# Patient Record
Sex: Male | Born: 1945 | Race: White | Hispanic: No | Marital: Married | Smoking: Current some day smoker
Health system: Southern US, Community
[De-identification: ages and names within clinical notes are randomized; demographics above are authoritative.]

## PROBLEM LIST (undated history)

## (undated) DIAGNOSIS — K429 Umbilical hernia without obstruction or gangrene: Secondary | ICD-10-CM

## (undated) DIAGNOSIS — Z95810 Presence of automatic (implantable) cardiac defibrillator: Secondary | ICD-10-CM

## (undated) DIAGNOSIS — M5030 Other cervical disc degeneration, unspecified cervical region: Secondary | ICD-10-CM

## (undated) DIAGNOSIS — E559 Vitamin D deficiency, unspecified: Secondary | ICD-10-CM

## (undated) DIAGNOSIS — Z950 Presence of cardiac pacemaker: Secondary | ICD-10-CM

## (undated) DIAGNOSIS — K279 Peptic ulcer, site unspecified, unspecified as acute or chronic, without hemorrhage or perforation: Secondary | ICD-10-CM

## (undated) DIAGNOSIS — M545 Other chronic pain: Secondary | ICD-10-CM

## (undated) DIAGNOSIS — I2510 Atherosclerotic heart disease of native coronary artery without angina pectoris: Secondary | ICD-10-CM

## (undated) DIAGNOSIS — I714 Abdominal aortic aneurysm, without rupture, unspecified: Secondary | ICD-10-CM

## (undated) DIAGNOSIS — N183 Chronic kidney disease, stage 3 unspecified: Secondary | ICD-10-CM

## (undated) DIAGNOSIS — J189 Pneumonia, unspecified organism: Secondary | ICD-10-CM

## (undated) DIAGNOSIS — H509 Unspecified strabismus: Secondary | ICD-10-CM

## (undated) DIAGNOSIS — R0789 Other chest pain: Secondary | ICD-10-CM

## (undated) DIAGNOSIS — F32A Depression, unspecified: Secondary | ICD-10-CM

## (undated) DIAGNOSIS — M2550 Pain in unspecified joint: Secondary | ICD-10-CM

## (undated) DIAGNOSIS — I420 Dilated cardiomyopathy: Secondary | ICD-10-CM

## (undated) DIAGNOSIS — F329 Major depressive disorder, single episode, unspecified: Secondary | ICD-10-CM

## (undated) DIAGNOSIS — S92009A Unspecified fracture of unspecified calcaneus, initial encounter for closed fracture: Secondary | ICD-10-CM

## (undated) DIAGNOSIS — M7918 Myalgia, other site: Secondary | ICD-10-CM

## (undated) DIAGNOSIS — G8929 Other chronic pain: Secondary | ICD-10-CM

## (undated) DIAGNOSIS — I340 Nonrheumatic mitral (valve) insufficiency: Secondary | ICD-10-CM

## (undated) DIAGNOSIS — J449 Chronic obstructive pulmonary disease, unspecified: Secondary | ICD-10-CM

## (undated) DIAGNOSIS — F17200 Nicotine dependence, unspecified, uncomplicated: Secondary | ICD-10-CM

## (undated) DIAGNOSIS — K922 Gastrointestinal hemorrhage, unspecified: Secondary | ICD-10-CM

## (undated) DIAGNOSIS — K219 Gastro-esophageal reflux disease without esophagitis: Secondary | ICD-10-CM

## (undated) DIAGNOSIS — I219 Acute myocardial infarction, unspecified: Secondary | ICD-10-CM

## (undated) DIAGNOSIS — Z8679 Personal history of other diseases of the circulatory system: Secondary | ICD-10-CM

## (undated) DIAGNOSIS — T4145XA Adverse effect of unspecified anesthetic, initial encounter: Secondary | ICD-10-CM

## (undated) DIAGNOSIS — F419 Anxiety disorder, unspecified: Secondary | ICD-10-CM

## (undated) DIAGNOSIS — I447 Left bundle-branch block, unspecified: Secondary | ICD-10-CM

## (undated) DIAGNOSIS — I739 Peripheral vascular disease, unspecified: Secondary | ICD-10-CM

## (undated) DIAGNOSIS — F4310 Post-traumatic stress disorder, unspecified: Secondary | ICD-10-CM

## (undated) DIAGNOSIS — R0602 Shortness of breath: Secondary | ICD-10-CM

## (undated) HISTORY — DX: Peptic ulcer, site unspecified, unspecified as acute or chronic, without hemorrhage or perforation: K27.9

## (undated) HISTORY — DX: Personal history of other diseases of the circulatory system: Z86.79

## (undated) HISTORY — DX: Left bundle-branch block, unspecified: I44.7

## (undated) HISTORY — PX: CARDIOVASCULAR STRESS TEST: SHX262

## (undated) HISTORY — DX: Chronic kidney disease, stage 3 (moderate): N18.3

## (undated) HISTORY — DX: Peripheral vascular disease, unspecified: I73.9

## (undated) HISTORY — DX: Chronic kidney disease, stage 3 unspecified: N18.30

## (undated) HISTORY — DX: Chronic obstructive pulmonary disease, unspecified: J44.9

## (undated) HISTORY — DX: Unspecified strabismus: H50.9

## (undated) HISTORY — DX: Depression, unspecified: F32.A

## (undated) HISTORY — DX: Anxiety disorder, unspecified: F41.9

## (undated) HISTORY — DX: Other cervical disc degeneration, unspecified cervical region: M50.30

## (undated) HISTORY — DX: Low back pain: M54.5

## (undated) HISTORY — DX: Abdominal aortic aneurysm, without rupture, unspecified: I71.40

## (undated) HISTORY — DX: Unspecified fracture of unspecified calcaneus, initial encounter for closed fracture: S92.009A

## (undated) HISTORY — DX: Other chronic pain: G89.29

## (undated) HISTORY — PX: OTHER SURGICAL HISTORY: SHX169

## (undated) HISTORY — DX: Atherosclerotic heart disease of native coronary artery without angina pectoris: I25.10

## (undated) HISTORY — DX: Other chest pain: R07.89

## (undated) HISTORY — PX: CARDIAC CATHETERIZATION: SHX172

## (undated) HISTORY — DX: Nonrheumatic mitral (valve) insufficiency: I34.0

## (undated) HISTORY — DX: Pain in unspecified joint: M25.50

## (undated) HISTORY — DX: Myalgia, other site: M79.18

## (undated) HISTORY — DX: Major depressive disorder, single episode, unspecified: F32.9

## (undated) HISTORY — DX: Abdominal aortic aneurysm, without rupture: I71.4

## (undated) HISTORY — DX: Nicotine dependence, unspecified, uncomplicated: F17.200

## (undated) HISTORY — DX: Dilated cardiomyopathy: I42.0

## (undated) HISTORY — DX: Other chronic pain: M54.50

## (undated) HISTORY — DX: Gastro-esophageal reflux disease without esophagitis: K21.9

## (undated) HISTORY — DX: Post-traumatic stress disorder, unspecified: F43.10

## (undated) HISTORY — DX: Vitamin D deficiency, unspecified: E55.9

---
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
## 1991-03-31 HISTORY — PX: FRACTURE SURGERY: SHX138

## 1999-03-16 ENCOUNTER — Encounter: Payer: Self-pay | Admitting: Physical Therapy

## 1999-03-16 ENCOUNTER — Ambulatory Visit (HOSPITAL_COMMUNITY): Admission: RE | Admit: 1999-03-16 | Discharge: 1999-03-16 | Payer: Self-pay | Admitting: Physical Therapy

## 2005-01-05 ENCOUNTER — Ambulatory Visit: Payer: Self-pay | Admitting: Family Medicine

## 2006-04-13 ENCOUNTER — Encounter: Payer: Self-pay | Admitting: Family Medicine

## 2007-06-06 ENCOUNTER — Encounter: Payer: Self-pay | Admitting: Family Medicine

## 2010-03-30 DIAGNOSIS — I420 Dilated cardiomyopathy: Secondary | ICD-10-CM

## 2010-03-30 DIAGNOSIS — T8859XA Other complications of anesthesia, initial encounter: Secondary | ICD-10-CM

## 2010-03-30 HISTORY — DX: Other complications of anesthesia, initial encounter: T88.59XA

## 2010-03-30 HISTORY — DX: Dilated cardiomyopathy: I42.0

## 2010-04-16 ENCOUNTER — Encounter: Payer: Self-pay | Admitting: Cardiology

## 2010-04-16 ENCOUNTER — Ambulatory Visit
Admission: RE | Admit: 2010-04-16 | Discharge: 2010-04-16 | Payer: Self-pay | Source: Home / Self Care | Attending: Family Medicine | Admitting: Family Medicine

## 2010-04-16 ENCOUNTER — Encounter: Payer: Self-pay | Admitting: Family Medicine

## 2010-04-16 DIAGNOSIS — J449 Chronic obstructive pulmonary disease, unspecified: Secondary | ICD-10-CM | POA: Insufficient documentation

## 2010-04-16 DIAGNOSIS — M545 Low back pain: Secondary | ICD-10-CM | POA: Insufficient documentation

## 2010-04-16 DIAGNOSIS — I2510 Atherosclerotic heart disease of native coronary artery without angina pectoris: Secondary | ICD-10-CM | POA: Insufficient documentation

## 2010-04-16 DIAGNOSIS — M67919 Unspecified disorder of synovium and tendon, unspecified shoulder: Secondary | ICD-10-CM | POA: Insufficient documentation

## 2010-04-16 DIAGNOSIS — J4489 Other specified chronic obstructive pulmonary disease: Secondary | ICD-10-CM | POA: Insufficient documentation

## 2010-04-16 DIAGNOSIS — F331 Major depressive disorder, recurrent, moderate: Secondary | ICD-10-CM | POA: Insufficient documentation

## 2010-04-16 DIAGNOSIS — M719 Bursopathy, unspecified: Secondary | ICD-10-CM

## 2010-04-16 DIAGNOSIS — I447 Left bundle-branch block, unspecified: Secondary | ICD-10-CM | POA: Insufficient documentation

## 2010-04-16 DIAGNOSIS — F17200 Nicotine dependence, unspecified, uncomplicated: Secondary | ICD-10-CM | POA: Insufficient documentation

## 2010-04-17 ENCOUNTER — Telehealth: Payer: Self-pay | Admitting: Family Medicine

## 2010-04-18 ENCOUNTER — Ambulatory Visit
Admission: RE | Admit: 2010-04-18 | Discharge: 2010-04-18 | Payer: Self-pay | Source: Home / Self Care | Attending: Cardiology | Admitting: Cardiology

## 2010-04-18 DIAGNOSIS — R0602 Shortness of breath: Secondary | ICD-10-CM | POA: Insufficient documentation

## 2010-04-18 DIAGNOSIS — R011 Cardiac murmur, unspecified: Secondary | ICD-10-CM | POA: Insufficient documentation

## 2010-04-21 ENCOUNTER — Encounter: Payer: Self-pay | Admitting: Family Medicine

## 2010-04-21 LAB — CONVERTED CEMR LAB
ALT: 12 units/L (ref 0–53)
AST: 14 units/L (ref 0–37)
Albumin: 4.7 g/dL (ref 3.5–5.2)
Alkaline Phosphatase: 107 units/L (ref 39–117)
BUN: 13 mg/dL (ref 6–23)
Basophils Absolute: 0.1 10*3/uL (ref 0.0–0.1)
Basophils Relative: 1 % (ref 0–1)
Bilirubin, Direct: 0.1 mg/dL (ref 0.0–0.3)
CO2: 26 meq/L (ref 19–32)
Calcium: 9.9 mg/dL (ref 8.4–10.5)
Chloride: 99 meq/L (ref 96–112)
Cholesterol: 231 mg/dL — ABNORMAL HIGH (ref 0–200)
Creatinine, Ser: 1.18 mg/dL (ref 0.40–1.50)
Eosinophils Absolute: 0.3 10*3/uL (ref 0.0–0.7)
Eosinophils Relative: 3 % (ref 0–5)
Glucose, Bld: 86 mg/dL (ref 70–99)
HCT: 47.8 % (ref 39.0–52.0)
HDL: 46 mg/dL (ref >39)
Hemoglobin: 16.2 g/dL (ref 13.0–17.0)
Indirect Bilirubin: 0.6 mg/dL (ref 0.0–0.9)
LDL Cholesterol: 159 mg/dL — ABNORMAL HIGH (ref 0–99)
Lymphocytes Relative: 29 % (ref 12–46)
Lymphs Abs: 2.9 10*3/uL (ref 0.7–4.0)
MCHC: 33.9 g/dL (ref 30.0–36.0)
MCV: 93.5 fL (ref 78.0–100.0)
Monocytes Absolute: 1 10*3/uL (ref 0.1–1.0)
Monocytes Relative: 10 % (ref 3–12)
Neutro Abs: 5.8 10*3/uL (ref 1.7–7.7)
Neutrophils Relative %: 58 % (ref 43–77)
Platelets: 276 10*3/uL (ref 150–400)
Potassium: 4.6 meq/L (ref 3.5–5.3)
RBC: 5.11 M/uL (ref 4.22–5.81)
RDW: 14 % (ref 11.5–15.5)
Sodium: 140 meq/L (ref 135–145)
Total Bilirubin: 0.7 mg/dL (ref 0.3–1.2)
Total CHOL/HDL Ratio: 5
Total Protein: 7.7 g/dL (ref 6.0–8.3)
Triglycerides: 132 mg/dL (ref <150)
VLDL: 26 mg/dL (ref 0–40)
WBC: 10 10*3/uL (ref 4.0–10.5)

## 2010-04-22 ENCOUNTER — Telehealth: Payer: Self-pay | Admitting: Family Medicine

## 2010-04-23 ENCOUNTER — Encounter: Payer: Self-pay | Admitting: Family Medicine

## 2010-04-24 ENCOUNTER — Encounter: Payer: Self-pay | Admitting: Family Medicine

## 2010-04-24 ENCOUNTER — Telehealth (INDEPENDENT_AMBULATORY_CARE_PROVIDER_SITE_OTHER): Payer: Self-pay | Admitting: *Deleted

## 2010-04-28 ENCOUNTER — Encounter: Payer: Self-pay | Admitting: Cardiology

## 2010-04-28 ENCOUNTER — Ambulatory Visit (HOSPITAL_COMMUNITY)
Admission: RE | Admit: 2010-04-28 | Discharge: 2010-04-28 | Payer: Self-pay | Source: Home / Self Care | Attending: Cardiology | Admitting: Cardiology

## 2010-04-28 ENCOUNTER — Encounter: Payer: Self-pay | Admitting: *Deleted

## 2010-04-28 ENCOUNTER — Ambulatory Visit: Admission: RE | Admit: 2010-04-28 | Discharge: 2010-04-28 | Payer: Self-pay | Source: Home / Self Care

## 2010-04-28 ENCOUNTER — Encounter (HOSPITAL_COMMUNITY)
Admission: RE | Admit: 2010-04-28 | Discharge: 2010-04-29 | Payer: Self-pay | Source: Home / Self Care | Attending: Cardiology | Admitting: Cardiology

## 2010-04-29 ENCOUNTER — Telehealth (INDEPENDENT_AMBULATORY_CARE_PROVIDER_SITE_OTHER): Payer: Self-pay | Admitting: *Deleted

## 2010-04-30 ENCOUNTER — Ambulatory Visit: Admit: 2010-04-30 | Payer: Self-pay | Admitting: Family Medicine

## 2010-04-30 ENCOUNTER — Other Ambulatory Visit: Payer: Self-pay | Admitting: Cardiology

## 2010-04-30 ENCOUNTER — Encounter: Payer: Self-pay | Admitting: Cardiology

## 2010-04-30 ENCOUNTER — Telehealth: Payer: Self-pay | Admitting: Cardiology

## 2010-04-30 ENCOUNTER — Ambulatory Visit (INDEPENDENT_AMBULATORY_CARE_PROVIDER_SITE_OTHER): Payer: Medicare Other | Admitting: Cardiology

## 2010-04-30 ENCOUNTER — Ambulatory Visit: Payer: Self-pay | Admitting: Cardiology

## 2010-04-30 ENCOUNTER — Ambulatory Visit: Payer: Self-pay | Admitting: Family Medicine

## 2010-04-30 DIAGNOSIS — I2510 Atherosclerotic heart disease of native coronary artery without angina pectoris: Secondary | ICD-10-CM

## 2010-04-30 DIAGNOSIS — I359 Nonrheumatic aortic valve disorder, unspecified: Secondary | ICD-10-CM | POA: Insufficient documentation

## 2010-04-30 DIAGNOSIS — I5023 Acute on chronic systolic (congestive) heart failure: Secondary | ICD-10-CM

## 2010-04-30 DIAGNOSIS — E785 Hyperlipidemia, unspecified: Secondary | ICD-10-CM | POA: Insufficient documentation

## 2010-04-30 DIAGNOSIS — I5022 Chronic systolic (congestive) heart failure: Secondary | ICD-10-CM | POA: Insufficient documentation

## 2010-05-01 ENCOUNTER — Ambulatory Visit: Payer: Self-pay | Admitting: Family Medicine

## 2010-05-01 ENCOUNTER — Encounter: Payer: Self-pay | Admitting: Family Medicine

## 2010-05-01 ENCOUNTER — Ambulatory Visit: Payer: Medicare Other | Admitting: Family Medicine

## 2010-05-01 DIAGNOSIS — I5022 Chronic systolic (congestive) heart failure: Secondary | ICD-10-CM

## 2010-05-01 DIAGNOSIS — M67919 Unspecified disorder of synovium and tendon, unspecified shoulder: Secondary | ICD-10-CM

## 2010-05-01 DIAGNOSIS — F17200 Nicotine dependence, unspecified, uncomplicated: Secondary | ICD-10-CM

## 2010-05-01 DIAGNOSIS — Z23 Encounter for immunization: Secondary | ICD-10-CM

## 2010-05-01 DIAGNOSIS — Z0000 Encounter for general adult medical examination without abnormal findings: Secondary | ICD-10-CM

## 2010-05-01 DIAGNOSIS — J449 Chronic obstructive pulmonary disease, unspecified: Secondary | ICD-10-CM

## 2010-05-01 DIAGNOSIS — M545 Low back pain: Secondary | ICD-10-CM

## 2010-05-01 DIAGNOSIS — I359 Nonrheumatic aortic valve disorder, unspecified: Secondary | ICD-10-CM

## 2010-05-01 LAB — BASIC METABOLIC PANEL
BUN: 17 mg/dL (ref 6–23)
CO2: 27 mEq/L (ref 19–32)
Calcium: 9.4 mg/dL (ref 8.4–10.5)
Chloride: 98 mEq/L (ref 96–112)
Creatinine, Ser: 1.2 mg/dL (ref 0.4–1.5)
GFR: 63.43 mL/min (ref >60.00)
Glucose, Bld: 77 mg/dL (ref 70–99)
Potassium: 4.5 mEq/L (ref 3.5–5.1)
Sodium: 134 mEq/L — ABNORMAL LOW (ref 135–145)

## 2010-05-01 LAB — CBC WITH DIFFERENTIAL/PLATELET
Basophils Absolute: 0.1 10*3/uL (ref 0.0–0.1)
Basophils Relative: 0.8 % (ref 0.0–3.0)
Eosinophils Absolute: 0.3 10*3/uL (ref 0.0–0.7)
Eosinophils Relative: 2.2 % (ref 0.0–5.0)
HCT: 49.7 % (ref 39.0–52.0)
Hemoglobin: 17.2 g/dL — ABNORMAL HIGH (ref 13.0–17.0)
Lymphocytes Relative: 22.2 % (ref 12.0–46.0)
Lymphs Abs: 2.6 10*3/uL (ref 0.7–4.0)
MCHC: 34.6 g/dL (ref 30.0–36.0)
MCV: 95.5 fl (ref 78.0–100.0)
Monocytes Absolute: 0.8 10*3/uL (ref 0.1–1.0)
Monocytes Relative: 7.1 % (ref 3.0–12.0)
Neutro Abs: 7.8 10*3/uL — ABNORMAL HIGH (ref 1.4–7.7)
Neutrophils Relative %: 67.7 % (ref 43.0–77.0)
Platelets: 284 10*3/uL (ref 150.0–400.0)
RBC: 5.21 Mil/uL (ref 4.22–5.81)
RDW: 14.7 % — ABNORMAL HIGH (ref 11.5–14.6)
WBC: 11.6 10*3/uL — ABNORMAL HIGH (ref 4.5–10.5)

## 2010-05-01 LAB — PROTIME-INR
INR: 1 ratio (ref 0.8–1.0)
Prothrombin Time: 11.6 s (ref 10.2–12.4)
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
## 2010-05-01 NOTE — Progress Notes (Signed)
Summary: Nuclear Pre-Procedure  Phone Note Outgoing Call   Call placed by: [facility] Summary of Call: Left message with information on Myoview Information Sheet (see scanned document for details).      Nuclear Med Background Indications for Stress Test: Evaluation for Ischemia, Abnormal EKG   History: COPD   Symptoms: DOE, SOB    Nuclear Pre-Procedure Cardiac Risk Factors: LBBB, Smoker Height (in): 67  Nuclear Med Study Referring MD:  D.McLean

## 2010-05-01 NOTE — Progress Notes (Signed)
Summary: Nuc Pre-Procedure  Phone Note Outgoing Call Call back at [facility] Phone [phone]   Call placed by: Antionette Char RN,  April 24, 2010 4:13 PM Call placed to: Patient Reason for Call: Confirm/change Appt Summary of Call: Left message with information on Myoview Information Sheet (see scanned document for details).      Nuclear Med Background Indications for Stress Test: Evaluation for Ischemia, Abnormal EKG   History: COPD   Symptoms: DOE, SOB    Nuclear Pre-Procedure Cardiac Risk Factors: LBBB, Smoker Height (in): 67  Nuclear Med Study Referring MD:  D.McLean

## 2010-05-01 NOTE — Progress Notes (Signed)
Summary: lab results  Phone Note Other Incoming   Summary of Call: pls notify: labs all normal EXCEPT his bad cholesterol was high.  Needs to start low cholesterol/low fat diet and also needs to start medication for this.  Erx for lipitor sent today. Initial call taken by: Michell Heinrich M.D.,  April 22, 2010 8:17 AM  Follow-up for Phone Call        Pt's wife notified of above.  She is agreeable with plan.  Pt has follwo up scheduled for 04/30/10. Follow-up by: Francee Piccolo CMA Duncan Dull),  April 22, 2010 1:47 PM    New/Updated Medications: LIPITOR 10 MG TABS (ATORVASTATIN CALCIUM) 1 tab by mouth qd Prescriptions: LIPITOR 10 MG TABS (ATORVASTATIN CALCIUM) 1 tab by mouth qd  #30 x 2   Entered and Authorized by:   Michell Heinrich M.D.   Signed by:   Michell Heinrich M.D. on 04/22/2010   Method used:   Electronically to        [facility] (retail)       [address] [place], [place]  [address]       Ph: [phone]       Fax: [phone]   RxID:   [id number]

## 2010-05-01 NOTE — Miscellaneous (Signed)
  Clinical Lists Changes  Medications: Added new medication of VENTOLIN HFA 108 (90 BASE) MCG/ACT AERS (ALBUTEROL SULFATE) 1-2 puffs every 4 hours as needed for wheezing or shortness of breath - Signed Rx of VENTOLIN HFA 108 (90 BASE) MCG/ACT AERS (ALBUTEROL SULFATE) 1-2 puffs every 4 hours as needed for wheezing or shortness of breath;  #1 x 1;  Signed;  Entered by: Michell Heinrich M.D.;  Authorized by: Michell Heinrich M.D.;  Method used: Electronically to [id number] [address] [place], [place], [place]  [address], Ph: [phone], Fax: [phone]    Prescriptions: VENTOLIN HFA 108 (90 BASE) MCG/ACT AERS (ALBUTEROL SULFATE) 1-2 puffs every 4 hours as needed for wheezing or shortness of breath  #1 x 1   Entered and Authorized by:   Michell Heinrich M.D.   Signed by:   Michell Heinrich M.D. on 04/24/2010   Method used:   Electronically to        [facility] (retail)       [address] [place], [place]  [address]       Ph: [phone]       Fax: [phone]   RxID:   [id number]

## 2010-05-05 ENCOUNTER — Inpatient Hospital Stay (HOSPITAL_BASED_OUTPATIENT_CLINIC_OR_DEPARTMENT_OTHER)
Admission: RE | Admit: 2010-05-05 | Discharge: 2010-05-05 | Disposition: A | Discharge status: Home / Self Care | Payer: Medicare Other | Source: Ambulatory Visit | Attending: Cardiology | Admitting: Cardiology

## 2010-05-05 DIAGNOSIS — I5020 Unspecified systolic (congestive) heart failure: Secondary | ICD-10-CM | POA: Insufficient documentation

## 2010-05-05 DIAGNOSIS — I719 Aortic aneurysm of unspecified site, without rupture: Secondary | ICD-10-CM

## 2010-05-05 DIAGNOSIS — I428 Other cardiomyopathies: Secondary | ICD-10-CM | POA: Insufficient documentation

## 2010-05-05 DIAGNOSIS — I2510 Atherosclerotic heart disease of native coronary artery without angina pectoris: Secondary | ICD-10-CM | POA: Insufficient documentation

## 2010-05-05 DIAGNOSIS — I714 Abdominal aortic aneurysm, without rupture, unspecified: Secondary | ICD-10-CM | POA: Insufficient documentation

## 2010-05-05 DIAGNOSIS — Q251 Coarctation of aorta: Secondary | ICD-10-CM | POA: Insufficient documentation

## 2010-05-05 DIAGNOSIS — I509 Heart failure, unspecified: Secondary | ICD-10-CM

## 2010-05-05 DIAGNOSIS — I700 Atherosclerosis of aorta: Secondary | ICD-10-CM | POA: Insufficient documentation

## 2010-05-05 LAB — POCT I-STAT 3, VENOUS BLOOD GAS (G3P V)
Acid-base deficit: 1 mmol/L (ref 0.0–2.0)
Bicarbonate: 23.6 mEq/L (ref 20.0–24.0)
Bicarbonate: 24.9 mEq/L — ABNORMAL HIGH (ref 20.0–24.0)
O2 Saturation: 47 %
TCO2: 25 mmol/L (ref 0–100)
pCO2, Ven: 46.9 mmHg (ref 45.0–50.0)
pH, Ven: 7.309 — ABNORMAL HIGH (ref 7.250–7.300)
pO2, Ven: 28 mmHg — CL (ref 30.0–45.0)

## 2010-05-05 LAB — POCT I-STAT 3, ART BLOOD GAS (G3+)
TCO2: 25 mmol/L (ref 0–100)
pCO2 arterial: 41.1 mmHg (ref 35.0–45.0)
pH, Arterial: 7.37 (ref 7.350–7.450)

## 2010-05-06 ENCOUNTER — Telehealth: Payer: Self-pay | Admitting: Cardiology

## 2010-05-06 ENCOUNTER — Ambulatory Visit: Payer: Self-pay | Admitting: Cardiology

## 2010-05-07 NOTE — Progress Notes (Signed)
Summary: records  Phone Note Other Incoming   Summary of Call: Please request records from Dr. Morrie Sheldon in [place], [place] and from Dr. Don Broach at [facility] in [place]. Initial call taken by: Michell Heinrich M.D.,  April 17, 2010 8:42 AM  Follow-up for Phone Call        LM for pt, need medical record request form signed Follow-up by: Lannette Donath,  April 22, 2010 10:58 AM  Additional Follow-up for Phone Call Additional follow up Details #1::        Received records from [facility] [place] Additional Follow-up by: Georga Bora,  April 25, 2010 9:29 AM    Additional Follow-up for Phone Call Additional follow up Details #2::    [facility] [phone] [facility]
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
## 2010-05-07 NOTE — Letter (Signed)
Summary: Cardiac Catheterization Instructions- [facility]  [facility], Main Office  [address]   [place], [place] [address]   Phone: [phone]  Fax: [phone]     04/30/2010 MRN: [id number]  Marvin Bell [address] [place]  [address]  Dear Mr. [name],   You are scheduled for a Cardiac Catheterization on Monday February 6,2012 with Dr. [name].  Please arrive to the 1st floor of the Heart and V[facility] Center at [facility] at 7 am  on the day of your procedure. Please do not arrive before 6:30 a.m. Call the Heart and Vascular Center at [phone] if you are unable to make your appointmnet. The Code to get into the parking garage under the building is 0300. Take the elevators to the 1st floor. You must have someone to drive you home. Someone must be with you for the first 24 hours after you arrive home. Please wear clothes that are easy to get on and off and wear slip-on shoes. Do not eat or drink after midnight except water with your medications that morning. Bring all your medications and current insurance cards with you.   _x__ Make sure you take your aspirin.  __x_ You may take ALL of your medications with water that morning.  The usual length of stay after your procedure is 2 to 3 hours. This can vary.  If you have any questions, please call the office at the number listed above.   Katina Dung, RN, BSN

## 2010-05-07 NOTE — Progress Notes (Signed)
Summary: Inhaler insurance question  Phone Note Call from Patient Call back at Home Phone [phone]   Caller: Patient Summary of Call: Pt states insurance will not pay for inhaler, pt has not picked up Rx, pt states that physician has to call insurance co & deem medication medically necessary before they will pay Initial call taken by: Lannette Donath,  April 29, 2010 11:52 AM  Follow-up for Phone Call        advised pt ins will not pay and his copay will be $40-42.  Pt will pick up sample tomorrow at visit. Follow-up by: Francee Piccolo CMA Duncan Dull),  April 29, 2010 2:34 PM

## 2010-05-07 NOTE — Assessment & Plan Note (Signed)
Summary: Cardiology Nuclear Testing  Nuclear Med Background Indications for Stress Test: Evaluation for Ischemia, Abnormal EKG   History: COPD   Symptoms: DOE, SOB    Nuclear Pre-Procedure Cardiac Risk Factors: LBBB, Smoker Caffeine/Decaff Intake: None NPO After: 7:00 PM Lungs: clear IV 0.9% NS with Angio Cath: 20g     IV Site: R Antecubital IV Started by: Irean Hong, RN Chest Size (in) 40     Height (in): 67 Weight (lb): 164 BMI: 25.78  Nuclear Med Study 1 or 2 day study:  1 day     Stress Test Type:  Adenosine Reading MD:  Cassell Clement, MD     Referring MD:  D.McLean Resting Radionuclide:  Technetium 67m Tetrofosmin     Resting Radionuclide Dose:  11 mCi  Stress Radionuclide:  Technetium 68m Tetrofosmin     Stress Radionuclide Dose:  33 mCi   Stress Protocol  Max Systolic BP: 104 mm HgDose of Adenosine:  41.7 mg    Stress Test Technologist:  Milana Na, EMT-P     Nuclear Technologist:  Domenic Polite, CNMT  Rest Procedure  Myocardial perfusion imaging was performed at rest 45 minutes following the intravenous administration of Technetium 47m Tetrofosmin.  Stress Procedure  The patient received IV adenosine at 140 mcg/kg/min for 4 minutes. There were no significant changes with infusion. Technetium 18m Tetrofosmin was injected at the 2 minute mark and quantitative spect images were obtained after a 45 minute delay.  QPS Raw Data Images:  Normal; no motion artifact; normal heart/lung ratio. Stress Images:  There is decreased uptake in the inferior wall and apex Rest Images:  There is decreased uptake in the inferior wall and apex Subtraction (SDS):  Partial reversibility inferoseptal area. Transient Ischemic Dilatation:  1.04  (Normal <1.22)  Lung/Heart Ratio:  .29  (Normal <0.45)  Quantitative Gated Spect Images QGS EDV:  282 ml QGS ESV:  244 ml QGS EF:  13 % QGS cine images:  Dilated LV with severe global hypokinesis.  Findings High risk  nuclear study Clinically Abnormal (chest pain, ST abnormality, hypotension) Evidence for inferior ischemia  Evidence for inferior infarct  Evidence for LV Dysfunction LV Dysfunction    Overall Impression  Exercise Capacity: Lexiscan with no exercise. BP Response: Hypotensive blood pressure response. Clinical Symptoms: Dizzy ECG Impression: LBBB Overall Impression: High risk stress nuclear study. Overall Impression Comments: Severe LV dysfunction likely due to multivessel CAD with areas of scar and peri-infarct ischemia in the apex and inferoseptal areas.  Appended Document: Cardiology Nuclear Testing areas consistent with MI.  cardiomyopathy.  Needs followup soon with me to discuss this and the echo.   Appended Document: Cardiology Nuclear Testing 04/29/10--0900am--LMTCB--nt  Appended Document: Cardiology Nuclear Testing Pt. aware. He will be here for appt with Dr. Shirlee Latch April 30, 2010 at 9:00  Appended Document: Cardiology Nuclear Testing appt 04/30/10 with Dr Juleen China [facility] 05/05/10

## 2010-05-07 NOTE — Progress Notes (Signed)
Summary: pt rtn call  Phone Note Call from Patient Call back at Home Phone [phone]   Caller: Patient Reason for Call: Talk to Nurse, Talk to Doctor Summary of Call: pt rtn a call from someone but he couldn't understand the message so he was rtning the call  Initial call taken by: Omer Jack,  April 30, 2010 10:31 AM  Follow-up for Phone Call        Pt returning call Judie Grieve  April 30, 2010 10:44 AM--I talked with pt--appt rescheduled to [facility] today with Dr Shirlee Latch

## 2010-05-09 ENCOUNTER — Encounter (INDEPENDENT_AMBULATORY_CARE_PROVIDER_SITE_OTHER): Payer: Medicare Other

## 2010-05-09 ENCOUNTER — Encounter: Payer: Self-pay | Admitting: Cardiology

## 2010-05-09 ENCOUNTER — Encounter: Payer: Self-pay | Admitting: Family Medicine

## 2010-05-09 DIAGNOSIS — I723 Aneurysm of iliac artery: Secondary | ICD-10-CM

## 2010-05-09 DIAGNOSIS — N183 Chronic kidney disease, stage 3 (moderate): Secondary | ICD-10-CM

## 2010-05-09 DIAGNOSIS — I714 Abdominal aortic aneurysm, without rupture, unspecified: Secondary | ICD-10-CM

## 2010-05-09 DIAGNOSIS — I700 Atherosclerosis of aorta: Secondary | ICD-10-CM

## 2010-05-12 ENCOUNTER — Encounter: Payer: Self-pay | Admitting: Cardiology

## 2010-05-12 ENCOUNTER — Encounter (INDEPENDENT_AMBULATORY_CARE_PROVIDER_SITE_OTHER): Payer: Medicare Other | Admitting: Cardiology

## 2010-05-12 ENCOUNTER — Other Ambulatory Visit (INDEPENDENT_AMBULATORY_CARE_PROVIDER_SITE_OTHER): Payer: Medicare Other

## 2010-05-12 DIAGNOSIS — I359 Nonrheumatic aortic valve disorder, unspecified: Secondary | ICD-10-CM

## 2010-05-12 DIAGNOSIS — I5022 Chronic systolic (congestive) heart failure: Secondary | ICD-10-CM

## 2010-05-12 DIAGNOSIS — R0989 Other specified symptoms and signs involving the circulatory and respiratory systems: Secondary | ICD-10-CM

## 2010-05-13 ENCOUNTER — Telehealth (INDEPENDENT_AMBULATORY_CARE_PROVIDER_SITE_OTHER): Payer: Self-pay | Admitting: *Deleted

## 2010-05-14 ENCOUNTER — Ambulatory Visit (HOSPITAL_COMMUNITY): Payer: Medicare Other

## 2010-05-14 ENCOUNTER — Ambulatory Visit (HOSPITAL_COMMUNITY): Payer: Medicare Other | Attending: Family Medicine

## 2010-05-14 ENCOUNTER — Encounter: Payer: Self-pay | Admitting: Cardiology

## 2010-05-14 DIAGNOSIS — I359 Nonrheumatic aortic valve disorder, unspecified: Secondary | ICD-10-CM

## 2010-05-14 DIAGNOSIS — J449 Chronic obstructive pulmonary disease, unspecified: Secondary | ICD-10-CM | POA: Insufficient documentation

## 2010-05-14 DIAGNOSIS — F17200 Nicotine dependence, unspecified, uncomplicated: Secondary | ICD-10-CM | POA: Insufficient documentation

## 2010-05-14 DIAGNOSIS — R0989 Other specified symptoms and signs involving the circulatory and respiratory systems: Secondary | ICD-10-CM

## 2010-05-14 DIAGNOSIS — J4489 Other specified chronic obstructive pulmonary disease: Secondary | ICD-10-CM | POA: Insufficient documentation

## 2010-05-15 NOTE — Miscellaneous (Signed)
Summary: Orders Update  Clinical Lists Changes  Problems: Added new problem of AAA (ICD-441.4) Orders: Added new Test order of Abdominal Aorta Duplex (Abd Aorta Duplex) - Signed 
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
## 2010-05-15 NOTE — Progress Notes (Signed)
Summary: Question about lasix  Phone Note From Pharmacy   Caller: [facility] [phone] Summary of Call: Question about medication Lasix Initial call taken by: Lorne Skeens,  May 06, 2010 10:30 AM  Follow-up for Phone Call        Attempted to call back about medication question, line rang busy x 3   Called back and spoke to pharmacist.  Dr. Shirlee Latch signed on the DAW line of the Lasix Rx.  Pharmacy was asking to authorize the generic.  Per Katina Dung, RN okay to dispense generic.   Follow-up by: Judithe Modest CMA,  May 06, 2010 1:53 PM

## 2010-05-16 LAB — CONVERTED CEMR LAB
BUN: 3 mg/dL — ABNORMAL LOW (ref 6–23)
Calcium: 9.4 mg/dL (ref 8.4–10.5)
Creatinine, Ser: 1.48 mg/dL (ref 0.40–1.50)
Glucose, Bld: 80 mg/dL (ref 70–99)
Pro B Natriuretic peptide (BNP): 119 pg/mL — ABNORMAL HIGH (ref 0.0–100.0)

## 2010-05-19 ENCOUNTER — Encounter (INDEPENDENT_AMBULATORY_CARE_PROVIDER_SITE_OTHER): Payer: Medicare Other

## 2010-05-19 ENCOUNTER — Encounter: Payer: Self-pay | Admitting: Internal Medicine

## 2010-05-19 DIAGNOSIS — R0602 Shortness of breath: Secondary | ICD-10-CM

## 2010-05-19 DIAGNOSIS — J449 Chronic obstructive pulmonary disease, unspecified: Secondary | ICD-10-CM

## 2010-05-21 ENCOUNTER — Encounter: Payer: Self-pay | Admitting: Cardiology
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
## 2010-05-21 NOTE — Progress Notes (Signed)
Summary: nuc pre procedure  Phone Note Outgoing Call Call back at Home Phone [phone]   Call placed by: Cathlyn Parsons RN,  May 13, 2010 11:25 AM Call placed to: Patient Reason for Call: Confirm/change Appt Summary of Call: Reviewed information on Myoview Information Sheet (see scanned document for further details).  Spoke with patient.

## 2010-05-23 NOTE — Procedures (Signed)
NAME:  Marvin Bell, Marvin Bell NO.:  [id number]  MEDICAL RECORD NO.:  [id number]           PATIENT TYPE:  LOCATION:                                 FACILITY:  PHYSICIAN:  [name], MD      DATE OF BIRTH:  22-Sep-1945  DATE OF PROCEDURE: DATE OF DISCHARGE:                           CARDIAC CATHETERIZATION   PROCEDURES: 1. Left heart catheterization. 2. Right heart catheterization. 3. Left ventriculography. 4. Abdominal aortic angiogram.  INDICATIONS:  Congestive heart failure with decreased LV systolic function, aortic stenosis, and abnormal Myoview.  PROCEDURE NOTE:  After informed consent was obtained, the right groin was sterilely prepped and draped.  Lidocaine 1% was used to locally anesthetize the right groin area.  The right common femoral vein was entered using modified Seldinger technique and a 7-French venous sheath was placed.  The right common femoral artery was entered using modified Seldinger technique and initially 4-French arterial sheath was placed. This was up-sized to a 5-French arterial sheath.  On left heart catheterization, we did encounter some difficulty in navigating the abdominal aorta.  There was a saccular abdominal aortic aneurysm. Eventually, we were able to pass the aneurysm and used to exchange the wire for further catheter exchanges.  The left coronary artery was engaged using the JL-5 catheter and the right coronary artery was engaged using the JR-4 catheter.  The aortic valve was crossed using a straight tip wire and the left ventricle was entered using angled pigtail catheter.  Only hand ventriculogram was done due to elevated left ventricular end-diastolic pressure.  Right heart catheterization was carried out using a balloon-tip Swan-Ganz catheter.  Samples were removed for oxygen saturation from pulmonary artery and from the femoral artery.  There were no complications.  FINDINGS: 1. Hemodynamics:  Aorta 127/64.  LV  151/31.  Mean right atrial     pressure 9 mmHg.  RV 53/20, question how accurate RV diastolic     pressure is.  PA 54/23 with a mean PA pressure of 37 mmHg.  Mean     pulmonary capillary wedge pressure 23 mmHg.  Cardiac output 2.1 and     cardiac index 1.1.  Aortic saturation 93%.  PA saturation 46%. 2. Left ventriculography:  EF was estimated to be 15-20%.  There was     severe global hypokinesis and ventriculogram only was done due to     elevated LV end-diastolic pressure. 3. Aortic valve calculations:  The aortic valve mean gradient from     superimposed aortic and LV waveform was about 20 mmHg.  The peak-to-     peak aortic valve gradient was 24 mmHg.  The aortic valve area     calculated by the Gorlin equation was 0.52 sq cm. 4. Abdominal aortic angiogram did show a saccular infrarenal AAA. 5. Right coronary artery:  The right coronary artery was a small,     nondominant vessel with about 30% ostial stenosis. 6. Left main:  The left main had no angiographic disease. 7. Left circumflex system:  The circumflex system was large and  dominant.  There is a large first obtuse marginal with minimal  disease.  There is a small second obtuse marginal, about 50% ostial     stenosis.  The small to moderate third obtuse marginal was totally     occluded in the midvessel.  There is also a left-sided PDA with     minimal disease. 8. LAD system:  There was a moderate first diagonal and moderate     second diagonal.  The apical LAD was relatively small.  The second     diagonal did follow the apical LAD down towards the apex.  There     were luminal irregularities in the LAD.  IMPRESSION: 1. Occlusion of the mid third obtuse marginal.  This is a small to     moderate vessel.  There is no other significant flow obstructing     disease in the coronary system. 2. Nonischemic cardiomyopathy with an ejection fraction of 15-20%.  I     am uncertain of the cause of cardiomyopathy.  The patient  does     reiterate again that it has been about 20 years since he drank     alcohol heavily. 3. Elevated left and right heart filling pressures. 4. Severe aortic stenosis by valve area using the Gorlin equation with     mild to moderate stenosis by mean gradient. 5. Saccular infrarenal aortic aneurysm.  PLAN:  We will start Lasix 40 mg daily with potassium 20 mEq daily.  I am going to see him in the office in about a week.  I want to arrange a dobutamine stress echo particularly for assessment of aortic stenosis, question is whether this is low gradient severe aortic stenosis versus pseudostenosis in the setting of poor LV systolic function.  Also, I am going to get an abdominal ultrasound to assess his AAA.     [name], MD     DM/MEDQ  D:  05/05/2010  T:  05/06/2010  Job:  [id number]  cc:   Jeoffrey Massed, MD  Electronically Signed by [name] MD on 05/23/2010 10:36:02 AM

## 2010-05-26 ENCOUNTER — Other Ambulatory Visit: Payer: Medicare Other

## 2010-05-26 ENCOUNTER — Encounter: Payer: Medicare Other | Admitting: Thoracic Surgery (Cardiothoracic Vascular Surgery)

## 2010-05-30 ENCOUNTER — Encounter: Payer: Self-pay | Admitting: Cardiology

## 2010-05-30 ENCOUNTER — Encounter (INDEPENDENT_AMBULATORY_CARE_PROVIDER_SITE_OTHER): Payer: Medicare Other | Admitting: Thoracic Surgery (Cardiothoracic Vascular Surgery)

## 2010-05-30 ENCOUNTER — Other Ambulatory Visit: Payer: Self-pay | Admitting: Thoracic Surgery (Cardiothoracic Vascular Surgery)

## 2010-05-30 DIAGNOSIS — I714 Abdominal aortic aneurysm, without rupture: Secondary | ICD-10-CM

## 2010-05-30 DIAGNOSIS — I712 Thoracic aortic aneurysm, without rupture: Secondary | ICD-10-CM

## 2010-05-30 DIAGNOSIS — I359 Nonrheumatic aortic valve disorder, unspecified: Secondary | ICD-10-CM
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
## 2010-06-02 ENCOUNTER — Encounter: Payer: Self-pay | Admitting: Cardiology

## 2010-06-02 ENCOUNTER — Encounter (INDEPENDENT_AMBULATORY_CARE_PROVIDER_SITE_OTHER): Payer: Self-pay | Admitting: *Deleted

## 2010-06-02 ENCOUNTER — Other Ambulatory Visit: Payer: Self-pay | Admitting: Cardiology

## 2010-06-02 ENCOUNTER — Other Ambulatory Visit (INDEPENDENT_AMBULATORY_CARE_PROVIDER_SITE_OTHER): Payer: Medicare Other

## 2010-06-02 DIAGNOSIS — R0602 Shortness of breath: Secondary | ICD-10-CM

## 2010-06-04 ENCOUNTER — Ambulatory Visit (HOSPITAL_COMMUNITY)
Admission: RE | Admit: 2010-06-04 | Discharge: 2010-06-04 | Disposition: A | Discharge status: Home / Self Care | Payer: Medicare Other | Source: Ambulatory Visit | Attending: Thoracic Surgery (Cardiothoracic Vascular Surgery) | Admitting: Thoracic Surgery (Cardiothoracic Vascular Surgery)

## 2010-06-04 ENCOUNTER — Encounter (HOSPITAL_COMMUNITY): Payer: Medicare Other

## 2010-06-04 ENCOUNTER — Inpatient Hospital Stay (HOSPITAL_COMMUNITY): Admission: RE | Admit: 2010-06-04 | Payer: Medicare Other | Source: Ambulatory Visit

## 2010-06-04 ENCOUNTER — Other Ambulatory Visit (HOSPITAL_COMMUNITY): Payer: Medicare Other

## 2010-06-04 ENCOUNTER — Encounter (HOSPITAL_COMMUNITY): Payer: Self-pay

## 2010-06-04 ENCOUNTER — Other Ambulatory Visit: Payer: Self-pay | Admitting: Thoracic Surgery (Cardiothoracic Vascular Surgery)

## 2010-06-04 DIAGNOSIS — I2510 Atherosclerotic heart disease of native coronary artery without angina pectoris: Secondary | ICD-10-CM | POA: Insufficient documentation

## 2010-06-04 DIAGNOSIS — J449 Chronic obstructive pulmonary disease, unspecified: Secondary | ICD-10-CM | POA: Insufficient documentation

## 2010-06-04 DIAGNOSIS — I714 Abdominal aortic aneurysm, without rupture, unspecified: Secondary | ICD-10-CM | POA: Insufficient documentation

## 2010-06-04 DIAGNOSIS — I712 Thoracic aortic aneurysm, without rupture: Secondary | ICD-10-CM

## 2010-06-04 DIAGNOSIS — I723 Aneurysm of iliac artery: Secondary | ICD-10-CM | POA: Insufficient documentation

## 2010-06-04 DIAGNOSIS — J4489 Other specified chronic obstructive pulmonary disease: Secondary | ICD-10-CM | POA: Insufficient documentation

## 2010-06-04 DIAGNOSIS — I700 Atherosclerosis of aorta: Secondary | ICD-10-CM | POA: Insufficient documentation

## 2010-06-04 MED ORDER — IOHEXOL 300 MG/ML  SOLN: 125.0000 mL | Freq: Once | INTRAMUSCULAR | Status: DC | PRN

## 2010-06-05 NOTE — Assessment & Plan Note (Signed)
Summary: FULL PFT WITH DIALATORS, DOCO ON ROOM AIR FOR COPD//SH   Allergies: 1)  ! Asa 2)  Pcn   Other Orders: Carbon Monoxide diffusing w/capacity [phone]) Lung Volumes/Gas dilution or washout (60454) Spirometry (Pre & Post) [phone])

## 2010-06-09 ENCOUNTER — Ambulatory Visit (HOSPITAL_COMMUNITY)
Admission: RE | Admit: 2010-06-09 | Discharge: 2010-06-09 | Disposition: A | Discharge status: Home / Self Care | Payer: Medicare Other | Source: Ambulatory Visit | Attending: Thoracic Surgery (Cardiothoracic Vascular Surgery) | Admitting: Thoracic Surgery (Cardiothoracic Vascular Surgery)

## 2010-06-09 DIAGNOSIS — Z0189 Encounter for other specified special examinations: Secondary | ICD-10-CM | POA: Insufficient documentation

## 2010-06-09 LAB — BLOOD GAS, ARTERIAL
Acid-Base Excess: 0.9 mmol/L (ref 0.0–2.0)
Patient temperature: 98.6
TCO2: 20 mmol/L (ref 0–100)

## 2010-06-10 ENCOUNTER — Telehealth: Payer: Self-pay | Admitting: Cardiology

## 2010-06-10 NOTE — Letter (Signed)
Summary: Appointment - Reschedule  [facility], Main Office  [address]   [place], [place] [address]   Phone: [phone]  Fax: [phone]        June 02, 2010 MRN: [id number]     Marvin Bell [address] [place]  [address]    Dear Marvin Bell,    Due to a change in our office schedule, your appointment on June 12, 2010 at 4:15pm with Dr. Shirlee Latch must be changed.  It is very important that we reach you to reschedule this appointment. We look forward to participating in your health care needs. Please contact us at the number listed above at your earliest convenience to reschedule this appointment.     Sincerely,  Roe Coombs  [facility] Scheduling Team

## 2010-06-12 ENCOUNTER — Ambulatory Visit: Payer: Medicare Other | Admitting: Cardiology

## 2010-06-17 NOTE — Progress Notes (Signed)
Summary: c/o nausea/diarrhea  Phone Note Call from Patient Call back at Home Phone [phone]   Caller: Spouse Reason for Call: Talk to Nurse Complaint: Nausea/Vomiting/Diarrhea, Breathing Problems Details of Complaint: no chestpain.  Initial call taken by: Lorne Skeens,  June 10, 2010 2:55 PM  Follow-up for Phone Call        Spoke with patient-c/o constant  nausea that has been going on  for several weeks now.  Unable to eat-has no appetite at all.  Left off all meds one day last week and had an appetite for the first time in a long while.  Knows it is one of his meds making him sick but does not know which one.  Also having some diarrhea.  Been sick so long that it is making him mentally bothered. Follow-up by: Dessie Coma  LPN,  June 10, 2010 3:15 PM     Appended Document: c/o nausea/diarrhea Most likely culprit is Vicodin or Diazepam.   Appended Document: c/o nausea/diarrhea I discussed with pt--pt states he is not using Vicodin--he is only taking his heart medication--he did not want to see his PCP, Dr Marvel Plan for futher evaluation of his nausea--he has an appt with Dr Shirlee Latch 07/07/10 and will discuss with him at that time

## 2010-06-18 ENCOUNTER — Other Ambulatory Visit (HOSPITAL_COMMUNITY): Payer: Medicare Other | Admitting: Dentistry

## 2010-06-18 DIAGNOSIS — K0401 Reversible pulpitis: Secondary | ICD-10-CM

## 2010-06-18 DIAGNOSIS — I359 Nonrheumatic aortic valve disorder, unspecified: Secondary | ICD-10-CM

## 2010-06-18 DIAGNOSIS — K0530 Chronic periodontitis, unspecified: Secondary | ICD-10-CM

## 2010-06-18 DIAGNOSIS — K036 Deposits [accretions] on teeth: Secondary | ICD-10-CM

## 2010-06-18 DIAGNOSIS — K029 Dental caries, unspecified: Secondary | ICD-10-CM

## 2010-06-20 ENCOUNTER — Encounter (HOSPITAL_COMMUNITY)
Admission: RE | Admit: 2010-06-20 | Discharge: 2010-06-20 | Disposition: A | Discharge status: Home / Self Care | Payer: Medicare Other | Source: Ambulatory Visit | Attending: Dentistry | Admitting: Dentistry

## 2010-06-20 ENCOUNTER — Other Ambulatory Visit (HOSPITAL_COMMUNITY): Payer: Self-pay | Admitting: Dentistry

## 2010-06-20 ENCOUNTER — Ambulatory Visit (HOSPITAL_COMMUNITY)
Admission: RE | Admit: 2010-06-20 | Discharge: 2010-06-20 | Disposition: A | Discharge status: Home / Self Care | Payer: Medicare Other | Source: Ambulatory Visit | Attending: Dentistry | Admitting: Dentistry

## 2010-06-20 DIAGNOSIS — F17200 Nicotine dependence, unspecified, uncomplicated: Secondary | ICD-10-CM | POA: Insufficient documentation

## 2010-06-20 DIAGNOSIS — Z01811 Encounter for preprocedural respiratory examination: Secondary | ICD-10-CM

## 2010-06-20 DIAGNOSIS — Z01810 Encounter for preprocedural cardiovascular examination: Secondary | ICD-10-CM | POA: Insufficient documentation

## 2010-06-20 DIAGNOSIS — J438 Other emphysema: Secondary | ICD-10-CM | POA: Insufficient documentation

## 2010-06-20 DIAGNOSIS — Z01812 Encounter for preprocedural laboratory examination: Secondary | ICD-10-CM | POA: Insufficient documentation

## 2010-06-20 DIAGNOSIS — Z01818 Encounter for other preprocedural examination: Secondary | ICD-10-CM | POA: Insufficient documentation

## 2010-06-20 DIAGNOSIS — I10 Essential (primary) hypertension: Secondary | ICD-10-CM | POA: Insufficient documentation

## 2010-06-20 LAB — CBC
HCT: 43.3 % (ref 39.0–52.0)
MCHC: 35.6 g/dL (ref 30.0–36.0)
MCV: 93.1 fL (ref 78.0–100.0)
RDW: 13.5 % (ref 11.5–15.5)
WBC: 7.8 10*3/uL (ref 4.0–10.5)

## 2010-06-20 LAB — BASIC METABOLIC PANEL
BUN: 11 mg/dL (ref 6–23)
CO2: 31 mEq/L (ref 19–32)
GFR calc non Af Amer: 58 mL/min — ABNORMAL LOW (ref >60)
Glucose, Bld: 79 mg/dL (ref 70–99)
Potassium: 4.6 mEq/L (ref 3.5–5.1)

## 2010-06-23 ENCOUNTER — Ambulatory Visit (HOSPITAL_COMMUNITY)
Admission: RE | Admit: 2010-06-23 | Discharge: 2010-06-23 | Disposition: A | Discharge status: Home / Self Care | Payer: Medicare Other | Source: Ambulatory Visit | Attending: Dentistry | Admitting: Dentistry

## 2010-06-23 DIAGNOSIS — K0530 Chronic periodontitis, unspecified: Secondary | ICD-10-CM | POA: Insufficient documentation

## 2010-06-23 DIAGNOSIS — I447 Left bundle-branch block, unspecified: Secondary | ICD-10-CM | POA: Insufficient documentation

## 2010-06-23 DIAGNOSIS — K0401 Reversible pulpitis: Secondary | ICD-10-CM

## 2010-06-23 DIAGNOSIS — I252 Old myocardial infarction: Secondary | ICD-10-CM | POA: Insufficient documentation

## 2010-06-23 DIAGNOSIS — F17200 Nicotine dependence, unspecified, uncomplicated: Secondary | ICD-10-CM | POA: Insufficient documentation

## 2010-06-23 DIAGNOSIS — Z01810 Encounter for preprocedural cardiovascular examination: Secondary | ICD-10-CM | POA: Insufficient documentation

## 2010-06-23 DIAGNOSIS — K036 Deposits [accretions] on teeth: Secondary | ICD-10-CM | POA: Insufficient documentation

## 2010-06-23 DIAGNOSIS — K029 Dental caries, unspecified: Secondary | ICD-10-CM | POA: Insufficient documentation

## 2010-06-23 DIAGNOSIS — I10 Essential (primary) hypertension: Secondary | ICD-10-CM | POA: Insufficient documentation

## 2010-06-23 DIAGNOSIS — I359 Nonrheumatic aortic valve disorder, unspecified: Secondary | ICD-10-CM | POA: Insufficient documentation

## 2010-06-24 ENCOUNTER — Encounter: Payer: Self-pay | Admitting: Cardiology

## 2010-06-24 NOTE — Op Note (Signed)
NAME:  Marvin Bell, Marvin Bell NO.:  [id number]  MEDICAL RECORD NO.:  [id number]           PATIENT TYPE:  O  LOCATION:  SDSC                         FACILITY:  [facility]  PHYSICIAN:  Charlynne Pander, D.D.S.DATE OF BIRTH:  [birth year]  DATE OF PROCEDURE:  06/23/2010 DATE OF DISCHARGE:  06/23/2010                              OPERATIVE REPORT   PREOPERATIVE DIAGNOSES: 1. Critical aortic stenosis. 2. Preaortic valve replacement dental protocol. 3. Acute pulpitis. 4. Chronic periodontitis. 5. Dental caries. 6. Accretions.  POSTOPERATIVE DIAGNOSES: 1. Critical aortic stenosis. 2. Preaortic valve replacement dental protocol. 3. Acute pulpitis. 4. Chronic periodontitis. 5. Dental caries. 6. Accretions.  PROCEDURES: 1. Extraction of tooth #2 and 16. 2. Two quadrants of alveoloplasty. 3. Gross debridement of the remaining dentition. 4. DO amalgam on tooth #20.  SURGEON:  Charlynne Pander, DDS  ASSISTANT:  Zettie Pho (dental assistant).  ANESTHESIA:  Monitored anesthesia care per the Anesthesia Team, Dr. Krista Blue attending.  MEDICATIONS: 1. 600 mg IV prior to invasive dental procedures. 2. Local anesthesia with total utilization of three carpules each     containing 34 mg of lidocaine with 0.017 mg of epinephrine as well     as one carpule containing 9 mg of bupivacaine with 0.009 mg of     epinephrine.  SPECIMENS:  There were two teeth that were discarded.  DRAINS:  None.  CULTURES:  None.  COMPLICATIONS:  None.  ESTIMATED BLOOD LOSS:  Less than 25 mL.  FLUIDS:  500 mL of lactated Ringer solution.  INDICATIONS:  The patient was diagnosed with critical aortic stenosis with anticipated aortic valve replacement.  The patient was seen as part of a preaortic valve replacement dental protocol evaluation to rule out dental infection that may affect the patient's systemic health and anticipated heart valve surgery.  A dental consultation was  performed and the patient was examined and treatment planned for extraction of tooth #2 and 16 with alveoloplasty as indicated along with gross debridement of the remaining dentition and dental restoration of tooth #20 as the patient condition permitted.  OPERATIVE FINDINGS:  The patient was examined in the operating room #2. The teeth were identified for extraction.  The patient noted to be affected by history of acute pulpitis, chronic periodontitis, dental caries, and the presence of accretions.  DESCRIPTION OF PROCEDURE:  The patient was brought to the main operating room #2.  The patient was then placed in supine position on the operating room table.  Monitored anesthesia care was then induced per the Anesthesia Team.  The patient was then prepped and draped in usual manner for dental medicine procedure.  A time-out was performed.  The patient was identified and procedures were verified.  The oral cavity was then thoroughly examined.  The findings as noted above.  The patient was then ready for the dental medicine procedure as follows:  Local anesthesia was administered sequentially with a total utilization of 3 carpules each containing 34 mg of lidocaine with 0.017 mg of epinephrine as well as 1 carpule each containing 9 mg of bupivacaine with 0.009 mg of epinephrine.  The maxillary left and  right quadrants were first approached.  The anesthesia was delivered via infiltration utilizing lidocaine with epinephrine.  At this point in time, a 15-blade incision was made from the distal of the maxillary right tuberosity and extended to the mesial of #3. Surgical flap was then carefully reflected.  Appropriate amounts of buccal and interseptal bone were removed.  Tooth #2 was then removed with the 53R forceps without complications.  Alveoplasty was then performed utilizing rongeurs and bone file.  Series of soft tissue incisions were made to remove the redundant maxillary  tuberosity tissues.  Further tissue was then removed as indicated and the tissues were trimmed appropriately.  The surgical site was then irrigated with copious amounts of sterile saline.  The surgical site was then closed from maxillary right tuberosity and extended to the distal #3 utilizing 3-0 chromic gut suture in a continuous interrupted suture technique x1.  At this point in time, the maxillary left surgical site was approached. A 15-blade incision was made from the distal of #16 and extended to the mesial #14 and surgical flap was then carefully reflected.  Tooth #16 was then removed with a 53L forceps without complications.  Alveoplasty was then performed utilizing rongeurs and bone file.  Distal wedge procedure was then performed utilizing a 15-blade and multiple soft tissue removals.  Tissues were reapproximated and trimmed appropriately. The surgical site was then irrigated with copious amounts of sterile saline.  Surgical site was then closed from maxillary left tuberosity and extended to the mesial #14 utilizing 3-0 chromic gut suture in a continuous interrupted suture technique x1.  At this point time, the remaining teeth were approached.  A Cavitron was used to remove significant accretions.  A series of hand curettes were utilized to refine the removal of accretions.  At this point in time, the entire mouth was irrigated with copious amounts of sterile saline.  At this point in time, tooth #20 was approached.  An appropriate DO amalgam perforation was produced utilizing handpiece and bur.  Caries were excavated appropriately.  Tooth was restored with an amalgam appropriately and it was carved and adjusted to fit the occlusion.  The amalgam was then burnished appropriately.  The occlusion was then rechecked and felt to be appropriate.  At this point in time, the entire mouth was irrigated with copious amounts of sterile saline.  The patient was examined for  complications, seeing none, dental medicine procedure deemed to be complete.  The patient was then handled over to the Anesthesia Team for final disposition.  After appropriate amount of time, the patient was taken to the postanesthesia care unit with stable vital signs and good oxygenation level.  All counts were correct for the dental medicine procedure.  The patient will be seen in approximately 1 week for evaluation for suture removal and will be cleared for the heart valve surgery as indicated approximately 2-7 days barring any significant complications.  The patient should be ready for discharge, but if complications arise, the patient can be admitted by the Hospitalist Team.     Charlynne Pander, D.D.S.     RFK/MEDQ  D:  06/23/2010  T:  06/24/2010  Job:  [id number]  cc:   [name], MD [name], M.D.  Electronically Signed by Cindra Eves D.D.S. on 06/24/2010 08:27:40 AM

## 2010-06-30 ENCOUNTER — Ambulatory Visit (HOSPITAL_COMMUNITY): Payer: Medicaid - Dental | Admitting: Dentistry

## 2010-06-30 DIAGNOSIS — K08409 Partial loss of teeth, unspecified cause, unspecified class: Secondary | ICD-10-CM

## 2010-06-30 DIAGNOSIS — K08109 Complete loss of teeth, unspecified cause, unspecified class: Secondary | ICD-10-CM

## 2010-07-07 ENCOUNTER — Encounter: Payer: Self-pay | Admitting: Cardiology

## 2010-07-07 ENCOUNTER — Ambulatory Visit (INDEPENDENT_AMBULATORY_CARE_PROVIDER_SITE_OTHER): Payer: Medicare Other | Admitting: Cardiology

## 2010-07-07 DIAGNOSIS — I359 Nonrheumatic aortic valve disorder, unspecified: Secondary | ICD-10-CM

## 2010-07-07 DIAGNOSIS — I2510 Atherosclerotic heart disease of native coronary artery without angina pectoris: Secondary | ICD-10-CM

## 2010-07-07 DIAGNOSIS — R0602 Shortness of breath: Secondary | ICD-10-CM

## 2010-07-07 DIAGNOSIS — F17200 Nicotine dependence, unspecified, uncomplicated: Secondary | ICD-10-CM

## 2010-07-07 DIAGNOSIS — I5022 Chronic systolic (congestive) heart failure: Secondary | ICD-10-CM

## 2010-07-07 MED ORDER — OMEPRAZOLE 40 MG PO CPDR: 40.0000 mg | DELAYED_RELEASE_CAPSULE | Freq: Every day | ORAL | Status: DC

## 2010-07-07 MED ORDER — SPIRONOLACTONE 25 MG PO TABS: ORAL_TABLET | ORAL | Status: DC

## 2010-07-07 NOTE — Patient Instructions (Signed)
Stop KCL(potassium).  Start Spironolactone 12.5mg  daily--this will be one-half of a 25mg  tablet daily.  Take Omeprazole 40mg  daily--you can take two 20mg  tablets daily. You do not need a prescription for the 20mg  tablet.  Lab in 2 weeks--BMP/BNP 428.22  Schedule an appointment with Dr Shirlee Latch for 1 month.

## 2010-07-08 NOTE — Assessment & Plan Note (Signed)
Occluded OM3, otherwise no significant disease.  Continue ASA 81 mg daily.  Patient did not tolerate pravastatin.  Before that, he did not tolerate Lipitor.  At next appointment, I will talk to him about trying Crestor 5 mg every other day as his LDL is quite high.

## 2010-07-08 NOTE — Assessment & Plan Note (Signed)
Low gradient severe aortic stenosis in the setting of dilated cardiomyopathy with LV systolic dysfunction.  There is contractile reserve: the stroke volume augmented 41% on dobutamine stress echo.  Patient has been seen by Dr. Cornelius Moras with CV[facility].  He is going back for a followup appointment next week.  He has stopped smoking.  He would be a very high risk surgery but is still relatively young.  We will need to make the decision regarding high risk AVR at [facility] versus referral to an academic center for evaluation for TAVR versus AVR with potential for advanced therapies for backup (VAD, [facility]).
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
## 2010-07-08 NOTE — Assessment & Plan Note (Signed)
Mild COPD only by PFTs.  Patient has quit smoking.

## 2010-07-08 NOTE — Assessment & Plan Note (Signed)
Primarily nonischemic cardiomyopathy.  Patient has only an occluded OM3 which does not explain his degree of LV dysfunction.  ? if cardiomyopathy could be due to severe AS over a long period of time, versus possible ETOH cardiomyopathy (though it has been many years since he drank heavily).  Volume status looks reasonable to day with Lasix.  - Restart spironolactone 12.5 mg daily.  I don't think that this was the cause of his abdominal discomfort.  Given his history of GERD, I will have him take omeprazole 40 mg daily also.   - Discontinue supplemental K. - BMET/BNP in 2 wks - Continue current doses of Lasix, lisinopril, bisoprolol.

## 2010-07-14 ENCOUNTER — Ambulatory Visit (INDEPENDENT_AMBULATORY_CARE_PROVIDER_SITE_OTHER): Payer: Medicare Other | Admitting: Thoracic Surgery (Cardiothoracic Vascular Surgery)

## 2010-07-14 ENCOUNTER — Ambulatory Visit: Payer: Medicare Other | Admitting: Thoracic Surgery (Cardiothoracic Vascular Surgery)

## 2010-07-14 DIAGNOSIS — I359 Nonrheumatic aortic valve disorder, unspecified: Secondary | ICD-10-CM

## 2010-07-15 NOTE — Assessment & Plan Note (Signed)
OFFICE VISIT  Marvin, Bell DOB:  Aug 26, 1945                                        July 14, 2010 CHART #:  [id number]  The patient returns for further followup of severe aortic stenosis with dilated nonischemic cardiomyopathy.  He was originally seen in consultation on May 30, 2010, and a full history and physical exam was dictated at that time.  Since then, the patient has been successful at quitting smoking.  He states that associated with this, he is actually feeling better and that he is not wheezing and he does not seem to get short of breath quite as easily as he has had been previously.  However, he still has significant exertional shortness of breath and mild tightness in his chest.  Review of pulmonary function test performed May 19, 2010, is notable for relatively mild obstruction. Specifically, the baseline FEV-1 measured 2.59 L or 91% predicted. However, the FEF 25 - 75 was only 1.85 L or 67% predicted.  The forced vital capacity was 3.54 L or 87% predicted.  Diffusion capacity was 60% predicted.  The patient also underwent CT angiogram of the chest, abdomen and pelvis on March 7.  This revealed moderate atherosclerotic disease in the aortic arch and descending thoracic aorta with some irregular plaque.  In the abdomen, the patient was noted to have a 3.6- cm infrarenal abdominal aortic aneurysm.  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  There was moderate atherosclerotic calcifications throughout the abdominal aorta and iliac vessels, but no significant aortoiliac occlusive disease.  Findings noted on these exams might affect the patient's candidacy for transfemoral approach for aortic valve implantation as a possible option for treatment of his aortic valve disease.  I again reviewed matters at length with the patient and his family  here in the office today.  Overall, I suspect that conventional aortic valve replacement maybe the most prudent course of action in the near future. However, the patient degree of left ventricular dysfunction is such that there is very significant possibility that he will not do well and/or potentially require mechanical circulatory support postoperatively.  As such, it may make sense for him to actually see a transplant cardiologist prior to having anything done to evaluate whether or not he might be a candidate for cardiac transplantation in the future should his ventricular function deteriorate any further, either with or without aortic valve replacement.  The patient is scheduled to return to see Dr. Shirlee Latch to discuss these matters further or later this month.  We will try to make arrangements for him to be referred for evaluation at [facility] if Dr. Shirlee Latch is in agreement.  Ultimately, we can certainly proceed with high-risk elective aortic valve replacement here at [facility], potentially with the possibility of mechanical ventricular support if necessary.  All his questions have been addressed.  Marvin Bell, M.D. Electronically Signed  CHO/MEDQ  D:  07/14/2010  T:  07/15/2010  Job:  [id number]  cc:   [name], MD Jeoffrey Massed, MD

## 2010-07-16 NOTE — Progress Notes (Signed)
Marvin Bell, Marvin Bell needs a referral to Dr. Versie Starks at [facility] for evaluation for high risk aortic valve replacement with VAD backup as option.

## 2010-07-20 NOTE — Progress Notes (Signed)
Marvin Bell, please refer Marvin Bell to Dr. Aundria Rud at [facility] for evaluation for high risk AVR.  I will need to send copies of echo, DSE, cath, notes, etc. Also would include consult note from Dr. Cornelius Moras. Thanks. Myrtha Tonkovich

## 2010-07-21 ENCOUNTER — Other Ambulatory Visit: Payer: Self-pay | Admitting: *Deleted

## 2010-07-21 ENCOUNTER — Other Ambulatory Visit (INDEPENDENT_AMBULATORY_CARE_PROVIDER_SITE_OTHER): Payer: Medicare Other | Admitting: *Deleted

## 2010-07-21 DIAGNOSIS — R0602 Shortness of breath: Secondary | ICD-10-CM

## 2010-07-21 DIAGNOSIS — I5022 Chronic systolic (congestive) heart failure: Secondary | ICD-10-CM

## 2010-07-22 ENCOUNTER — Telehealth: Payer: Self-pay | Admitting: *Deleted

## 2010-07-22 ENCOUNTER — Telehealth: Payer: Self-pay | Admitting: Cardiology

## 2010-07-22 LAB — BASIC METABOLIC PANEL
Calcium: 9.4 mg/dL (ref 8.4–10.5)
Creatinine, Ser: 1.2 mg/dL (ref 0.4–1.5)
GFR: 62.21 mL/min (ref >60.00)
Glucose, Bld: 62 mg/dL — ABNORMAL LOW (ref 70–99)
Sodium: 140 mEq/L (ref 135–145)

## 2010-07-22 NOTE — Telephone Encounter (Signed)
Walk in Pt Form " Needs to Speak with McLean/Anne" sent to [facility] 07/22/10/KM

## 2010-07-22 NOTE — Telephone Encounter (Signed)
Marvin Bell, Marvin Bell needs referral to Versie Starks at [facility] for evaluation for high risk aortic valve replacement with possibility for VAD backup. Thanks.   Dr Shirlee Latch talked with pt about referral to Dr Aundria Rud.  Dr Aundria Rud appointments  # [phone]. I was told the first available appt with Dr Aundria Rud was July 2012.   I was transferred to Crystal from the appointments # to try to arrange a sooner appt. I LMTCB for Crystal.

## 2010-07-24 NOTE — Telephone Encounter (Signed)
LMTCB at [phone] (Crystal ?) to try to arrange a sooner appt

## 2010-07-24 NOTE — Telephone Encounter (Signed)
LMTCB

## 2010-07-28 NOTE — Telephone Encounter (Signed)
LMTCB fo Crystal

## 2010-07-28 NOTE — Telephone Encounter (Signed)
I talked with Crystal at Dr Aundria Rud' office. She was going to call pt and arrange an appt for Monday or Thursday of next week.

## 2010-07-28 NOTE — Telephone Encounter (Signed)
I talked with pt. Pt states he has a message from Dr Aundria Rud' office about an appt but got home too late to call. Pt will try to return their call tomorrow.

## 2010-08-01 NOTE — Telephone Encounter (Signed)
I talked with Crystal at Dr Aundria Rud' office. Pt saw Dr Aundria Rud 07/30/10. We become disconnected before I could transfer her to medical records so they could fax records to Dr Aundria Rud. I was unable to get her back on the telephone.

## 2010-08-01 NOTE — Telephone Encounter (Signed)
LMTCB --I am trying to find out when pt is scheduled to see Dr Aundria Rud.

## 2010-08-04 ENCOUNTER — Telehealth: Payer: Self-pay | Admitting: Cardiology

## 2010-08-04 ENCOUNTER — Other Ambulatory Visit: Payer: Self-pay | Admitting: *Deleted

## 2010-08-04 DIAGNOSIS — M719 Bursopathy, unspecified: Secondary | ICD-10-CM

## 2010-08-04 MED ORDER — HYDROCODONE-ACETAMINOPHEN 5-500 MG PO TABS: 1.0000 | ORAL_TABLET | Freq: Four times a day (QID) | ORAL | Status: DC | PRN

## 2010-08-04 NOTE — Telephone Encounter (Signed)
I spoke with the pt's wife and she wanted to know if the pt needs to keep his appointment with Dr Shirlee Latch on 08/13/10.  The pt saw Dr Aundria Rud at [facility] and he was going to discuss the pt's case with another physician.  Dr Aundria Rud instructed the pt to call his office on 08/13/10 to discuss plan of care.  I made her aware that I would forward this message to Thurston Hole RN to discuss with Dr Shirlee Latch.  I did not cancel this pt's appt at this time.

## 2010-08-04 NOTE — Telephone Encounter (Signed)
Pt wife would like to know if he needs to keep this appt since he is going to [facility] now or if he can cancel it.

## 2010-08-04 NOTE — Telephone Encounter (Signed)
Faxed request for refill.  Per Dr. Milinda Cave, OK to fill this time with #[phone] no additional refills.  Pt will need office visit for more refills.

## 2010-08-06 NOTE — Telephone Encounter (Signed)
I reviewed with Dr Shirlee Latch. Pt to keep appt with Dr Shirlee Latch 08/13/10. I talked with Laurell Roof nurse that works with Dr Aundria Rud at [phone]. I faxed records to Laurell Roof at [phone]. I discussed with pt by telephone.

## 2010-08-07 ENCOUNTER — Telehealth: Payer: Self-pay | Admitting: Cardiology

## 2010-08-07 NOTE — Telephone Encounter (Signed)
Pt needs cd of actual heart cath done sent to her -pls call

## 2010-08-07 NOTE — Telephone Encounter (Signed)
Cath lab will burn CD of cardiac cath. Called and LVM for Victorino Dike with [facility] to call me back to see how soon they need this. Will route to Thurston Hole, Dr. Alford Highland RN, to obtain copy of CD to mail to [facility].

## 2010-08-07 NOTE — Telephone Encounter (Signed)
Spoke with Laurell Roof with [facility] and she would like for [place] to mail her a CD of Marvin Bell's heart cath within the next week. Cath lab is preparing the CD. Her address is:  [address] [employer] [employer] in [place] [address]. Her number is [phone]. I will route this to Dr. Alford Highland RN.

## 2010-08-08 NOTE — Telephone Encounter (Signed)
I talked with Loraine Leriche at [facility] lab. Onalee Hua was making a  CD of cath done 05/05/10. I have given Loraine Leriche the address to mail the CD to Dr Crecencio Mc at  [facility]. Loraine Leriche stated that the CD would be mailed to Dr Eustace Moore at  [facility]  today from the hospital.

## 2010-08-11 ENCOUNTER — Encounter: Payer: Self-pay | Admitting: Thoracic Surgery (Cardiothoracic Vascular Surgery)

## 2010-08-12 ENCOUNTER — Telehealth: Payer: Self-pay | Admitting: Cardiology

## 2010-08-12 NOTE — Telephone Encounter (Signed)
Pt calling re being very depressed, not worried about surgery or money but not sure he wants to go any further with appts, tests,procedures..., "no disrespect to dr Shirlee Latch either"-wants a call

## 2010-08-12 NOTE — Telephone Encounter (Signed)
Pt's dtr calling re appt pt cxl

## 2010-08-12 NOTE — Telephone Encounter (Signed)
I talked with pt. Pt cancelled his appt with Dr Shirlee Latch for tomorrow and rescheduled to 09/23/10.  He states he just doesn't feel like getting out of bed.Pt states he has a long  history of depression.  He states he is not considering harming himself. He is not sure that he wants to have AVR, but he is planning on following up with Dr Aundria Rud at [facility] tomorrow by telephone. He states he feels he is stable from a cardiology standpoint. He  denies any SOB or lightheadedness/dizziness. I will forward to Dr Shirlee Latch for review.

## 2010-08-13 ENCOUNTER — Ambulatory Visit: Payer: Self-pay | Admitting: Cardiology

## 2010-08-13 NOTE — Telephone Encounter (Signed)
I would like to see him soon.  He ought to see his PCP regarding the depression.

## 2010-08-14 NOTE — Telephone Encounter (Signed)
LMTCB

## 2010-08-15 ENCOUNTER — Telehealth: Payer: Self-pay | Admitting: Cardiology

## 2010-08-15 NOTE — Telephone Encounter (Signed)
Pt rtn call to Thurston Hole from yesterday he thinks she was calling to let him know he can pick up cath CD to take to [facility]

## 2010-08-15 NOTE — Telephone Encounter (Signed)
Pt calling to see if c cath results (put on CD) is ready to pick up to take to [facility]--advised anne not here today--please call back on Monday--pt agrees--nt

## 2010-08-19 NOTE — Telephone Encounter (Signed)
See phone note dated 08/07/10 for further details. Pt is aware that CD had been mailed to Dr Aundria Rud. Pt stated he did not need to pick up a copy. Dr Aundria Rud' nurse  called him Friday stating that they needed a copy of the cath CD.

## 2010-08-19 NOTE — Telephone Encounter (Signed)
See other phone note dated 08/15/10 about follow-up at [facility].

## 2010-08-19 NOTE — Telephone Encounter (Signed)
Per Shelbie Ammons in [facility] cath lab. The cath CD from February 2012 was mailed to Dr Aundria Rud at [facility].

## 2010-08-19 NOTE — Telephone Encounter (Signed)
I talked with pt. Pt states his depression is better. He does see a psychiatrist is [place] regularly for his depression.

## 2010-08-19 NOTE — Telephone Encounter (Signed)
I left a message with Laurell Roof Dr Aundria Rud' nurse at [phone] to be sure that they had received the cath CD .

## 2010-08-28 ENCOUNTER — Telehealth: Payer: Self-pay | Admitting: Cardiology

## 2010-08-28 NOTE — Telephone Encounter (Signed)
Pt calling re info from [facility] a dr rogers

## 2010-08-28 NOTE — Telephone Encounter (Signed)
Spoke with patient  He has still not heard for Dr Donnelly Angelica regarding surgery  Called and left message for Daine Gip at [facility]  I told her that the patient has not heard from [facility] and is not mad but wondering if the surgery is necessary if no one will call him back  hsve asked that Victorino Dike call the patient and [place] back with status  Patient aware

## 2010-09-01 ENCOUNTER — Telehealth: Payer: Self-pay | Admitting: *Deleted

## 2010-09-01 NOTE — Telephone Encounter (Signed)
See phone note 09/01/10.

## 2010-09-01 NOTE — Telephone Encounter (Signed)
Will forward to [employer]

## 2010-09-01 NOTE — Telephone Encounter (Signed)
Victorino Dike, RN with Dr Aundria Rud at [facility] called to say that Dr Aundria Rud had received and reviewed the cath CD. Dr Aundria Rud was going to refer pt to a surgeon , Dr Elesa Massed. Victorino Dike stated she had been in touch with the pt and he was aware that Dr Aundria Rud was arranging an appt with him with Dr Romona Curls.

## 2010-09-02 NOTE — Telephone Encounter (Signed)
I talked with pt today. Pt is aware that Victorino Dike at Dr Aundria Rud' office is working on an appt for pt with Dr Romona Curls.

## 2010-09-02 NOTE — Telephone Encounter (Signed)
Per pt wife calling pt still  Waiting for  appointment at [facility].

## 2010-09-10 ENCOUNTER — Telehealth: Payer: Self-pay | Admitting: *Deleted

## 2010-09-10 ENCOUNTER — Other Ambulatory Visit: Payer: Self-pay | Admitting: *Deleted

## 2010-09-10 DIAGNOSIS — J449 Chronic obstructive pulmonary disease, unspecified: Secondary | ICD-10-CM

## 2010-09-10 MED ORDER — ALBUTEROL SULFATE (2.5 MG/3ML) 0.083% IN NEBU: 2.5000 mg | INHALATION_SOLUTION | RESPIRATORY_TRACT | Status: DC | PRN

## 2010-09-10 NOTE — Telephone Encounter (Signed)
Prior auth request received for DuoNeb.  We will change to Albuterol 0.083%.  [facility] notified to d/c duoneb and fill albuterol RX.

## 2010-09-15 ENCOUNTER — Telehealth: Payer: Self-pay

## 2010-09-15 NOTE — Telephone Encounter (Signed)
Left a message for pt to return my call. MD needs to know if pt has ever tried any other inhaler (if so, what ones)? If not pt must try inhaler before Albuteral would be approved.

## 2010-09-16 ENCOUNTER — Encounter: Payer: Self-pay | Admitting: Family Medicine

## 2010-09-16 ENCOUNTER — Other Ambulatory Visit: Payer: Self-pay | Admitting: Family Medicine

## 2010-09-16 ENCOUNTER — Telehealth: Payer: Self-pay | Admitting: Cardiology

## 2010-09-16 DIAGNOSIS — I5022 Chronic systolic (congestive) heart failure: Secondary | ICD-10-CM

## 2010-09-16 MED ORDER — FUROSEMIDE 40 MG PO TABS: ORAL_TABLET | ORAL | Status: DC

## 2010-09-16 MED ORDER — BISOPROLOL FUMARATE 5 MG PO TABS: ORAL_TABLET | ORAL | Status: DC

## 2010-09-16 MED ORDER — ALBUTEROL 90 MCG/ACT IN AERS: 2.0000 | INHALATION_SPRAY | Freq: Four times a day (QID) | RESPIRATORY_TRACT | Status: DC | PRN

## 2010-09-16 MED ORDER — SPIRONOLACTONE 25 MG PO TABS: ORAL_TABLET | ORAL | Status: DC

## 2010-09-16 MED ORDER — LISINOPRIL 5 MG PO TABS: 5.0000 mg | ORAL_TABLET | Freq: Every day | ORAL | Status: DC

## 2010-09-16 NOTE — Telephone Encounter (Signed)
Called pt at [phone] to discuss depression--LMOM at about 09:45.--PM

## 2010-09-16 NOTE — Telephone Encounter (Signed)
Pt is stating that he is depressed around this time of year and has an appt with [facility] on Thursday 09-18-10, but is thinking about cancelling in. Pt states he saw his sister die when he was 7 around this time. Pt states he "doesn't feel like going on any more". I informed pt that he should really keep his appt on Thursday and he stated he understands that and he understands the concern.

## 2010-09-16 NOTE — Telephone Encounter (Signed)
Pt states he took  Ventolin HFA but it left a bad taste.

## 2010-09-16 NOTE — Progress Notes (Signed)
Rx sent for proventil today. Albuterol neb sol'n was denied---he needs to fail inhaler first.  He reports bad taste with ventolin in the past but did not fail the med. --PM

## 2010-09-16 NOTE — Telephone Encounter (Signed)
Pt has changed insurance company and needs to have new prescriptions.  He can get them free if he can get a 90 day supply.  He needs Lisinopril 5mg  1 qd, Furosemide 40 mg, 1/2 q day,Bisoprolo 5 mg 1/2 qd, Spironolactone 25 mg 1/2 q day.  Please fax to [facility].  Pt did not have fax number.

## 2010-09-16 NOTE — Telephone Encounter (Signed)
I have sent these to [facility]

## 2010-09-16 NOTE — Telephone Encounter (Signed)
I will send these into [facility]

## 2010-09-19 ENCOUNTER — Telehealth: Payer: Self-pay | Admitting: Cardiology

## 2010-09-19 NOTE — Telephone Encounter (Signed)
Pt wanted to let you know he canceled Monday's appt to see Dr. Shirlee Latch because he will be having surgery in the next two weeks at [facility] and will call and schedule another appt after that is completed

## 2010-09-19 NOTE — Telephone Encounter (Signed)
WILL FORWARD MESSAGE TO MD./CY

## 2010-09-22 ENCOUNTER — Ambulatory Visit: Payer: Self-pay | Admitting: Cardiology

## 2010-09-23 ENCOUNTER — Telehealth: Payer: Self-pay | Admitting: *Deleted

## 2010-09-23 NOTE — Telephone Encounter (Signed)
Dr Shirlee Latch has received and reviewed  notes from 09/18/10 from [facility].

## 2010-09-23 NOTE — Telephone Encounter (Signed)
I got the message about Mr Marvin Bell getting surgery at [facility]. Could you get the records of his last appointment over there for me to review? Thanks. 09/19/10   09/23/10 LMTCB  for Jennifer,RN  at Dr Aundria Rud' office [phone]

## 2010-10-02 ENCOUNTER — Ambulatory Visit (INDEPENDENT_AMBULATORY_CARE_PROVIDER_SITE_OTHER): Payer: Medicare Other | Admitting: Family Medicine

## 2010-10-02 ENCOUNTER — Encounter: Payer: Self-pay | Admitting: Family Medicine

## 2010-10-02 VITALS — BP 98/62 | HR 73 | Temp 97.6°F | Ht 66.0 in | Wt 169.0 lb

## 2010-10-02 DIAGNOSIS — I359 Nonrheumatic aortic valve disorder, unspecified: Secondary | ICD-10-CM

## 2010-10-02 DIAGNOSIS — F3289 Other specified depressive episodes: Secondary | ICD-10-CM

## 2010-10-02 DIAGNOSIS — F329 Major depressive disorder, single episode, unspecified: Secondary | ICD-10-CM

## 2010-10-02 DIAGNOSIS — J019 Acute sinusitis, unspecified: Secondary | ICD-10-CM

## 2010-10-02 MED ORDER — BUPROPION HCL ER (XL) 150 MG PO TB24: 150.0000 mg | ORAL_TABLET | Freq: Every day | ORAL | Status: DC

## 2010-10-02 MED ORDER — CEPHALEXIN 500 MG PO CAPS: 500.0000 mg | ORAL_CAPSULE | Freq: Three times a day (TID) | ORAL | Status: AC

## 2010-10-02 NOTE — Assessment & Plan Note (Signed)
Scheduled for AV surgery at [facility] in 5d.

## 2010-10-02 NOTE — Assessment & Plan Note (Signed)
Not ideal control. Add generic wellbutrin XL 150mg  qd.  Continue 75mg  tid venlafaxine. F/u 83mo.
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
## 2010-10-02 NOTE — Assessment & Plan Note (Signed)
Continue symptomatic care. Add keflex 500mg  tid x 7d.

## 2010-10-07 HISTORY — PX: AORTIC VALVE REPLACEMENT: SHX41

## 2010-10-22 ENCOUNTER — Telehealth: Payer: Self-pay | Admitting: Family Medicine

## 2010-10-22 DIAGNOSIS — J449 Chronic obstructive pulmonary disease, unspecified: Secondary | ICD-10-CM

## 2010-10-22 MED ORDER — ALBUTEROL SULFATE (2.5 MG/3ML) 0.083% IN NEBU: 2.5000 mg | INHALATION_SOLUTION | RESPIRATORY_TRACT | Status: DC | PRN

## 2010-10-22 NOTE — Telephone Encounter (Signed)
I spoke to [facility] and pt has transferred RX to them.  Unable to accept transferred RX's under his medicare plan so they need a new RX.  RX printed and singed by Dr. Milinda Cave and faxed to pharm at number below.

## 2010-10-22 NOTE — Telephone Encounter (Signed)
Needs Rx albuderal solution faxed [phone] to her per Medicare B, need ICD-9 code & has to be signed by physician

## 2010-10-27 ENCOUNTER — Other Ambulatory Visit: Payer: Self-pay | Admitting: Family Medicine

## 2010-10-28 NOTE — Telephone Encounter (Signed)
Pt has appt on 8/6.  RX sent.

## 2010-11-03 ENCOUNTER — Telehealth: Payer: Self-pay | Admitting: Family Medicine

## 2010-11-03 ENCOUNTER — Ambulatory Visit (INDEPENDENT_AMBULATORY_CARE_PROVIDER_SITE_OTHER): Payer: Medicare Other | Admitting: Family Medicine

## 2010-11-03 ENCOUNTER — Encounter: Payer: Self-pay | Admitting: Family Medicine

## 2010-11-03 VITALS — BP 104/66 | HR 92 | Ht 66.0 in | Wt 155.0 lb

## 2010-11-03 DIAGNOSIS — F329 Major depressive disorder, single episode, unspecified: Secondary | ICD-10-CM

## 2010-11-03 DIAGNOSIS — M67919 Unspecified disorder of synovium and tendon, unspecified shoulder: Secondary | ICD-10-CM

## 2010-11-03 DIAGNOSIS — F3289 Other specified depressive episodes: Secondary | ICD-10-CM

## 2010-11-03 DIAGNOSIS — J4489 Other specified chronic obstructive pulmonary disease: Secondary | ICD-10-CM

## 2010-11-03 DIAGNOSIS — J449 Chronic obstructive pulmonary disease, unspecified: Secondary | ICD-10-CM

## 2010-11-03 DIAGNOSIS — M545 Low back pain, unspecified: Secondary | ICD-10-CM

## 2010-11-03 DIAGNOSIS — I359 Nonrheumatic aortic valve disorder, unspecified: Secondary | ICD-10-CM

## 2010-11-03 DIAGNOSIS — M719 Bursopathy, unspecified: Secondary | ICD-10-CM

## 2010-11-03 MED ORDER — BUPROPION HCL ER (XL) 150 MG PO TB24: 150.0000 mg | ORAL_TABLET | Freq: Every day | ORAL | Status: DC

## 2010-11-03 MED ORDER — ALBUTEROL 90 MCG/ACT IN AERS: 2.0000 | INHALATION_SPRAY | Freq: Four times a day (QID) | RESPIRATORY_TRACT | Status: DC | PRN

## 2010-11-03 MED ORDER — HYDROCODONE-ACETAMINOPHEN 5-500 MG PO TABS: 1.0000 | ORAL_TABLET | Freq: Four times a day (QID) | ORAL | Status: DC | PRN

## 2010-11-03 MED ORDER — MOMETASONE FURO-FORMOTEROL FUM 200-5 MCG/ACT IN AERO: INHALATION_SPRAY | RESPIRATORY_TRACT | Status: DC

## 2010-11-03 NOTE — Assessment & Plan Note (Signed)
Slight improvement. Continue effexor and wellbutrin at current dosing.  F/u 42mo.

## 2010-11-03 NOTE — Assessment & Plan Note (Signed)
Now several weeks s/p aortic valve replacement at [facility]. Will obtain hosp records and try to see why he is not on anticoagulant after getting a prosthetic valve put in.

## 2010-11-03 NOTE — Assessment & Plan Note (Signed)
Not well controlled. Financial constraints: pt requests samples if new med started, so I'll do this for best chance of good compliance.   Gave 2 cannisters of dulera 200/5, advised 2 puffs bid.  Continue albuterol q4-6 hr prn--proventil HFA rx renewed today.  Has neb solution at home as well. Recheck 59mo.
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
## 2010-11-03 NOTE — Assessment & Plan Note (Signed)
Plus acute post-op sternal pain: renewed vicodin rx today--see order.

## 2010-11-03 NOTE — Telephone Encounter (Signed)
Pls request records from recent surgery and hospitalization at [facility] med center (Dr. Romona Curls was the surgeon).  Thx--PM

## 2010-11-03 NOTE — Telephone Encounter (Signed)
Faxed request to [facility] [phone]

## 2010-11-11 ENCOUNTER — Encounter: Payer: Self-pay | Admitting: Family Medicine

## 2010-11-20 ENCOUNTER — Encounter: Payer: Self-pay | Admitting: Cardiology

## 2010-12-03 ENCOUNTER — Encounter: Payer: Self-pay | Admitting: Family Medicine

## 2010-12-03 ENCOUNTER — Ambulatory Visit (INDEPENDENT_AMBULATORY_CARE_PROVIDER_SITE_OTHER): Payer: Medicare Other | Admitting: Family Medicine

## 2010-12-03 VITALS — BP 96/65 | HR 83 | Temp 97.9°F | Ht 67.0 in | Wt 156.8 lb

## 2010-12-03 DIAGNOSIS — G4700 Insomnia, unspecified: Secondary | ICD-10-CM

## 2010-12-03 NOTE — Progress Notes (Signed)
OFFICE NOTE  12/03/2010  CC:  Chief Complaint  Patient presents with  . Insomnia    X 1 week     HPI: Patient is a 65 y.o. Caucasian male who is here for insomnia. Had nightmares a lot while in hospital recently for aortic valve replacement.  These resolved, but since d/c home he has had LOTS of trouble sleeping.  Lays there for hours at a time, mind won't stop. No restless legs, no pain inhibiting sleep.  Says he does not feel like it is coming from wellbutritrin, which we started 1 mo ago.  Depression still affecting him but he feels a bit improved since starting the med. Has tried zolpidem in the past and it didn't help any.  Says he took a zyrtec tab last night and got 6+ hours of sleep and feels better today.  Pertinent PMH:  Anxiety/Depression Systolic heart failure Aortic stenosis, now s/p mechanical valve replacement  Pertinent Meds:  PE: Blood pressure 96/65, pulse 83, temperature 97.9 F (36.6 C), temperature source Oral, height 5\' 7"  (1.702 m), weight 156 lb 12.8 oz (71.124 kg), SpO2 97.00%. Gen: Alert, well appearing.  Patient is oriented to person, place, time, and situation. No further exam today.  IMPRESSION AND PLAN: Insomnia. I advised him to continue zyrtec 10mg  qhs prn. Hopefully as his depression improves the insomnia will resolve.  FOLLOW UP: already set for next month.

## 2010-12-08 ENCOUNTER — Encounter: Payer: Self-pay | Admitting: Cardiology

## 2010-12-08 ENCOUNTER — Ambulatory Visit (INDEPENDENT_AMBULATORY_CARE_PROVIDER_SITE_OTHER): Payer: Medicare Other | Admitting: Cardiology

## 2010-12-08 DIAGNOSIS — I5022 Chronic systolic (congestive) heart failure: Secondary | ICD-10-CM

## 2010-12-08 DIAGNOSIS — I714 Abdominal aortic aneurysm, without rupture, unspecified: Secondary | ICD-10-CM

## 2010-12-08 DIAGNOSIS — F17200 Nicotine dependence, unspecified, uncomplicated: Secondary | ICD-10-CM

## 2010-12-08 DIAGNOSIS — I359 Nonrheumatic aortic valve disorder, unspecified: Secondary | ICD-10-CM

## 2010-12-08 DIAGNOSIS — R0989 Other specified symptoms and signs involving the circulatory and respiratory systems: Secondary | ICD-10-CM

## 2010-12-08 DIAGNOSIS — E785 Hyperlipidemia, unspecified: Secondary | ICD-10-CM

## 2010-12-08 DIAGNOSIS — IMO0001 Reserved for inherently not codable concepts without codable children: Secondary | ICD-10-CM

## 2010-12-08 MED ORDER — BUPROPION HCL ER (SR) 150 MG PO TB12: ORAL_TABLET | ORAL | Status: DC

## 2010-12-08 MED ORDER — ROSUVASTATIN CALCIUM 5 MG PO TABS: ORAL_TABLET | ORAL | Status: DC

## 2010-12-08 NOTE — Patient Instructions (Addendum)
Start Crestor 5mg  every other day.  Start Welbutrin 150mg  daily for 3 days, then increase to one twice a day. This is to help you stop smoking.  Your physician recommends that you have lab today--BMP/BNP 424.1  428.22  Dr Shirlee Latch has referred you to Cardiac Rehab at [facility] [phone].  Your physician has requested that you have an echocardiogram. Echocardiography is a painless test that uses sound waves to create images of your heart. It provides your doctor with information about the size and shape of your heart and how well your heart's chambers and valves are working. This procedure takes approximately one hour. There are no restrictions for this procedure. In the next week or so.    Your physician recommends that you schedule a follow-up appointment in: 6 weeks with Dr Shirlee Latch.  Your physician recommends that you return for a FASTING lipid profile/liver profile in 2 months 424.1  428.22

## 2010-12-09 LAB — BASIC METABOLIC PANEL
CO2: 29 mEq/L (ref 19–32)
Calcium: 9.1 mg/dL (ref 8.4–10.5)
Creatinine, Ser: 1.2 mg/dL (ref 0.4–1.5)
GFR: 62.13 mL/min (ref >60.00)
Glucose, Bld: 66 mg/dL — ABNORMAL LOW (ref 70–99)
Sodium: 139 mEq/L (ref 135–145)
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
## 2010-12-10 DIAGNOSIS — F17200 Nicotine dependence, unspecified, uncomplicated: Secondary | ICD-10-CM | POA: Insufficient documentation

## 2010-12-10 NOTE — Assessment & Plan Note (Addendum)
Marvin Bell seems to be doing well symptomatically following his high risk AVR.  I will get an echo for a baseline evaluation of the bioprosthetic valve and also to assess EF since valvular lesion has been corrected.  Patient will do cardiac rehab at [facility].

## 2010-12-10 NOTE — Assessment & Plan Note (Signed)
Myalgias with statins.  Goal LDL < 70 given CAD.  I will start Crestor 5 mg every other day.  Lipids/LFTs in 2 months.

## 2010-12-10 NOTE — Assessment & Plan Note (Signed)
Repeat abdominal US in 2/13.

## 2010-12-10 NOTE — Assessment & Plan Note (Signed)
I strongly encouraged him to quit.  Wellbutrin helped in the past, so I will have him restart it.

## 2010-12-10 NOTE — Assessment & Plan Note (Addendum)
Diffuse severe LV hypokinesis by prior echo.  I hope that this has improved with AVR as I think that long-standing severe AS was the cause of the cardiomyopathy.  Will assess with repeat echo.  Continue Coreg and spironolactone.  I will get a BMET today and restart his lisinopril at 5 mg daily.  BMET in 2 wks.  If his EF remains low over time, CRT-D will be an option for him ( I will get another echo 6 months after operation if EF remains depressed).

## 2010-12-17 ENCOUNTER — Ambulatory Visit (HOSPITAL_COMMUNITY): Payer: Medicare Other | Attending: Cardiology | Admitting: Radiology

## 2010-12-17 DIAGNOSIS — I5022 Chronic systolic (congestive) heart failure: Secondary | ICD-10-CM

## 2010-12-17 DIAGNOSIS — Z954 Presence of other heart-valve replacement: Secondary | ICD-10-CM | POA: Insufficient documentation

## 2010-12-17 DIAGNOSIS — E785 Hyperlipidemia, unspecified: Secondary | ICD-10-CM | POA: Insufficient documentation

## 2010-12-17 DIAGNOSIS — F17200 Nicotine dependence, unspecified, uncomplicated: Secondary | ICD-10-CM | POA: Insufficient documentation

## 2010-12-17 DIAGNOSIS — J4489 Other specified chronic obstructive pulmonary disease: Secondary | ICD-10-CM | POA: Insufficient documentation

## 2010-12-17 DIAGNOSIS — I080 Rheumatic disorders of both mitral and aortic valves: Secondary | ICD-10-CM | POA: Insufficient documentation

## 2010-12-17 DIAGNOSIS — J449 Chronic obstructive pulmonary disease, unspecified: Secondary | ICD-10-CM | POA: Insufficient documentation

## 2010-12-17 DIAGNOSIS — I079 Rheumatic tricuspid valve disease, unspecified: Secondary | ICD-10-CM | POA: Insufficient documentation

## 2010-12-17 DIAGNOSIS — I428 Other cardiomyopathies: Secondary | ICD-10-CM | POA: Insufficient documentation

## 2010-12-17 DIAGNOSIS — I359 Nonrheumatic aortic valve disorder, unspecified: Secondary | ICD-10-CM

## 2010-12-23 ENCOUNTER — Telehealth: Payer: Self-pay | Admitting: Cardiology

## 2010-12-23 NOTE — Telephone Encounter (Signed)
Pt returning your call

## 2010-12-23 NOTE — Telephone Encounter (Signed)
I talked with pt about recent echo results. 

## 2010-12-24 ENCOUNTER — Telehealth: Payer: Self-pay | Admitting: Cardiology

## 2010-12-24 NOTE — Telephone Encounter (Signed)
I talked with pt. 

## 2010-12-24 NOTE — Telephone Encounter (Signed)
Pt calling re echo he had done last week, has question

## 2010-12-26 ENCOUNTER — Encounter: Payer: Self-pay | Admitting: Family Medicine

## 2010-12-29 ENCOUNTER — Telehealth: Payer: Self-pay | Admitting: *Deleted

## 2010-12-29 NOTE — Telephone Encounter (Signed)
PC from pt stating that he has had heart valve replacement and that is working well.  States he has been told that heart function on left side is still at 15%.  Pt is concerned about this.  Pt wants to know if there is anything that he can do to make it better.  Pt starts rehab on Wednesday.  Has been walking up/down stairs.  Please advise.

## 2010-12-29 NOTE — Telephone Encounter (Signed)
He should take the medications prescribed by cardiologist and do the rehab they have set up for him.  I have nothing additional to recommend.  Thx--PM

## 2010-12-30 NOTE — Telephone Encounter (Signed)
Notified pt of below, he is agreeable and appreciative of advice.

## 2011-01-01 ENCOUNTER — Ambulatory Visit (INDEPENDENT_AMBULATORY_CARE_PROVIDER_SITE_OTHER): Payer: Medicare Other | Admitting: Family Medicine

## 2011-01-01 ENCOUNTER — Encounter: Payer: Self-pay | Admitting: Family Medicine

## 2011-01-01 VITALS — BP 102/72 | HR 91 | Temp 98.1°F | Wt 159.0 lb

## 2011-01-01 DIAGNOSIS — G4700 Insomnia, unspecified: Secondary | ICD-10-CM

## 2011-01-01 DIAGNOSIS — E785 Hyperlipidemia, unspecified: Secondary | ICD-10-CM

## 2011-01-01 DIAGNOSIS — I5022 Chronic systolic (congestive) heart failure: Secondary | ICD-10-CM

## 2011-01-01 DIAGNOSIS — Z23 Encounter for immunization: Secondary | ICD-10-CM

## 2011-01-01 DIAGNOSIS — F17200 Nicotine dependence, unspecified, uncomplicated: Secondary | ICD-10-CM

## 2011-01-01 DIAGNOSIS — F329 Major depressive disorder, single episode, unspecified: Secondary | ICD-10-CM

## 2011-01-01 MED ORDER — TRAZODONE HCL 50 MG PO TABS: ORAL_TABLET | ORAL | Status: DC

## 2011-01-01 MED ORDER — ALBUTEROL SULFATE HFA 108 (90 BASE) MCG/ACT IN AERS: 2.0000 | INHALATION_SPRAY | Freq: Four times a day (QID) | RESPIRATORY_TRACT | Status: DC | PRN

## 2011-01-01 NOTE — Assessment & Plan Note (Signed)
Gradually improving. Continue venlafaxine and add wellbutrin xl 150mg  back on as stated in problem above.

## 2011-01-01 NOTE — Assessment & Plan Note (Signed)
I think most of the recent worsening of this is secondary to fear associated with sleep since having delirium and nightmares post-op recently. Will do trial of trazodone 50-100mg  qhs prn.  Therapeutic expectations and side effect profile of medication discussed today.  Patient's questions answered.
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
## 2011-01-01 NOTE — Progress Notes (Signed)
Addended by: Francee Piccolo C on: 01/01/2011 04:30 PM   Modules accepted: Orders

## 2011-01-01 NOTE — Assessment & Plan Note (Signed)
I believe much of his balance/dizziness is from borderline low bp and ongoing low EF.  I think he is stable and no med adjustments are needed. He will continue with cardiac rehab that he just started yesterday.

## 2011-01-01 NOTE — Assessment & Plan Note (Signed)
I discussed the potential benefits and risks of this med with him today, tried to acknowledge his fears but encouraged him to put them in perspective and try not to take such a catastrophic approach when he hears reports about meds from non-medical (although well meaning) people.  He understood, accepted this, decided to go ahead and start the med--I gave him 6 boxes of 7 tabs samples, enough to last 12 weeks taking it once qod. He has lab appt to recheck lipids and liver tests next month.

## 2011-01-01 NOTE — Assessment & Plan Note (Signed)
I recommended he restart wellbutrin xl 150mg  qd as previously instructed. He has a quit date.  Encouraged him to quit, call for problems.

## 2011-01-02 ENCOUNTER — Ambulatory Visit: Payer: Medicare Other | Admitting: Family Medicine

## 2011-01-13 ENCOUNTER — Ambulatory Visit: Payer: Medicare Other | Admitting: Cardiology

## 2011-01-29 ENCOUNTER — Encounter: Payer: Self-pay | Admitting: Family Medicine

## 2011-01-29 ENCOUNTER — Ambulatory Visit (INDEPENDENT_AMBULATORY_CARE_PROVIDER_SITE_OTHER): Payer: Medicare Other | Admitting: Family Medicine

## 2011-01-29 VITALS — BP 96/60 | HR 66 | Ht 66.5 in | Wt 165.0 lb

## 2011-01-29 DIAGNOSIS — F3289 Other specified depressive episodes: Secondary | ICD-10-CM

## 2011-01-29 DIAGNOSIS — F329 Major depressive disorder, single episode, unspecified: Secondary | ICD-10-CM

## 2011-01-29 DIAGNOSIS — F17200 Nicotine dependence, unspecified, uncomplicated: Secondary | ICD-10-CM

## 2011-01-29 DIAGNOSIS — G4700 Insomnia, unspecified: Secondary | ICD-10-CM

## 2011-01-29 DIAGNOSIS — Z23 Encounter for immunization: Secondary | ICD-10-CM

## 2011-01-29 DIAGNOSIS — J4489 Other specified chronic obstructive pulmonary disease: Secondary | ICD-10-CM

## 2011-01-29 DIAGNOSIS — J449 Chronic obstructive pulmonary disease, unspecified: Secondary | ICD-10-CM

## 2011-01-29 DIAGNOSIS — I359 Nonrheumatic aortic valve disorder, unspecified: Secondary | ICD-10-CM

## 2011-01-29 MED ORDER — ROFLUMILAST 500 MCG PO TABS: 500.0000 ug | ORAL_TABLET | Freq: Every day | ORAL | Status: DC

## 2011-01-29 MED ORDER — ESZOPICLONE 1 MG PO TABS: 1.0000 mg | ORAL_TABLET | Freq: Every day | ORAL | Status: DC

## 2011-02-02 NOTE — Assessment & Plan Note (Addendum)
Improved, but still would like to get things better. Encouraged pt/congratulated him on quitting smoking. Continue dulera and prn albut, plus will add Daliresp 500 mcg qd today (5 wks of samples given today).  Therapeutic expectations and side effect profile of medication discussed today.  Patient's questions answered. Will do overnight oxygen testing in near future.
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
## 2011-02-02 NOTE — Assessment & Plan Note (Signed)
Chronic. Failed ambien and trazodone. Will do trial of lunesta, start at 1mg  qhs and titrate up by 1mg  every few days to max of 3mg  qhs.  Therapeutic expectations and side effect profile of medication discussed today.  Patient's questions answered.

## 2011-02-02 NOTE — Assessment & Plan Note (Signed)
Problem stable.  Continue current medications and diet appropriate for this condition.  We have reviewed our general long term plan for this problem and also reviewed symptoms and signs that should prompt the patient to call or return to the office.  

## 2011-02-02 NOTE — Assessment & Plan Note (Signed)
S/p AVR, doing well with cardiac rehab through [facility].  Continue this and current meds, plus continue routine f/u with his cardiologist.

## 2011-02-05 ENCOUNTER — Other Ambulatory Visit (INDEPENDENT_AMBULATORY_CARE_PROVIDER_SITE_OTHER): Payer: Medicare Other | Admitting: *Deleted

## 2011-02-05 DIAGNOSIS — I5022 Chronic systolic (congestive) heart failure: Secondary | ICD-10-CM

## 2011-02-05 DIAGNOSIS — I359 Nonrheumatic aortic valve disorder, unspecified: Secondary | ICD-10-CM

## 2011-02-05 LAB — HEPATIC FUNCTION PANEL
AST: 13 U/L (ref 0–37)
Albumin: 4 g/dL (ref 3.5–5.2)
Alkaline Phosphatase: 92 U/L (ref 39–117)
Bilirubin, Direct: 0 mg/dL (ref 0.0–0.3)
Total Bilirubin: 0.5 mg/dL (ref 0.3–1.2)

## 2011-02-05 LAB — LIPID PANEL
LDL Cholesterol: 76 mg/dL (ref 0–99)
Total CHOL/HDL Ratio: 3
Triglycerides: 138 mg/dL (ref 0.0–149.0)

## 2011-02-10 ENCOUNTER — Telehealth: Payer: Self-pay | Admitting: Cardiology

## 2011-02-10 NOTE — Telephone Encounter (Signed)
All Cardiac faxed to [facility] @ [phone]  02/10/11/

## 2011-02-12 ENCOUNTER — Ambulatory Visit (INDEPENDENT_AMBULATORY_CARE_PROVIDER_SITE_OTHER): Payer: Medicare Other | Admitting: Cardiology

## 2011-02-12 ENCOUNTER — Encounter: Payer: Self-pay | Admitting: Cardiology

## 2011-02-12 DIAGNOSIS — I5022 Chronic systolic (congestive) heart failure: Secondary | ICD-10-CM

## 2011-02-12 DIAGNOSIS — I714 Abdominal aortic aneurysm, without rupture: Secondary | ICD-10-CM

## 2011-02-12 DIAGNOSIS — I359 Nonrheumatic aortic valve disorder, unspecified: Secondary | ICD-10-CM

## 2011-02-12 DIAGNOSIS — I2510 Atherosclerotic heart disease of native coronary artery without angina pectoris: Secondary | ICD-10-CM

## 2011-02-12 MED ORDER — SPIRONOLACTONE 25 MG PO TABS: 25.0000 mg | ORAL_TABLET | Freq: Every day | ORAL | Status: DC

## 2011-02-12 MED ORDER — LISINOPRIL 5 MG PO TABS: 5.0000 mg | ORAL_TABLET | Freq: Every day | ORAL | Status: DC

## 2011-02-12 MED ORDER — CARVEDILOL 6.25 MG PO TABS: 6.2500 mg | ORAL_TABLET | Freq: Two times a day (BID) | ORAL | Status: DC

## 2011-02-12 NOTE — Patient Instructions (Signed)
Start lisinopril 5mg  daily.  Increase spironolactone to 25mg  daily.  Your physician recommends that you return for lab work in: 1 week--BMET/BNP  424.0  428.22  Your physician recommends that you schedule a follow-up appointment in: 2 months with Dr Shirlee Latch.  Your physician has requested that you have an echocardiogram. Echocardiography is a painless test that uses sound waves to create images of your heart. It provides your doctor with information about the size and shape of your heart and how well your heart's chambers and valves are working. This procedure takes approximately one hour. There are no restrictions for this procedure. MARCH 2013  Your physician has requested that you have an abdominal aorta duplex. During this test, an ultrasound is used to evaluate the aorta. Allow 30 minutes for this exam. Do not eat after midnight the day before and avoid carbonated beverages MARCH 2013

## 2011-02-15 NOTE — Assessment & Plan Note (Signed)
Mr Standage seems to be doing well symptomatically following his high risk AVR. The valve appeared well-seated by echo with mild AI.

## 2011-02-15 NOTE — Assessment & Plan Note (Signed)
EF remains 15% post-AVR (nonischemic CMP).  NYHA class II-III symptoms.  He does not appear volume overloaded on exam.  - Continue Coreg - Increase spironolactone to 25 mg daily.  - start lisinopril 5 mg daily.    - BMET/BNP in 2 weeks.  - Follow closely to titrate medications.   - Echo in 3/13 (6 months after AVR).  If EF is still low, will need BiV-ICD.

## 2011-02-15 NOTE — Assessment & Plan Note (Signed)
Occluded OM3, otherwise no significant disease.  Continue ASA 81 mg daily.  Marvin Bell has been tolerating Crestor 5 mg daily with no myalgias.  LDL was near goal (< 70) when checked recently.

## 2011-02-15 NOTE — Assessment & Plan Note (Signed)
Abdominal US in 3/13.
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
## 2011-02-20 ENCOUNTER — Other Ambulatory Visit: Payer: Medicare Other | Admitting: *Deleted

## 2011-02-23 ENCOUNTER — Other Ambulatory Visit (INDEPENDENT_AMBULATORY_CARE_PROVIDER_SITE_OTHER): Payer: Medicare Other | Admitting: *Deleted

## 2011-02-23 DIAGNOSIS — I359 Nonrheumatic aortic valve disorder, unspecified: Secondary | ICD-10-CM

## 2011-02-23 DIAGNOSIS — R0989 Other specified symptoms and signs involving the circulatory and respiratory systems: Secondary | ICD-10-CM

## 2011-02-23 DIAGNOSIS — I5022 Chronic systolic (congestive) heart failure: Secondary | ICD-10-CM

## 2011-02-23 LAB — BASIC METABOLIC PANEL
CO2: 27 mEq/L (ref 19–32)
Chloride: 106 mEq/L (ref 96–112)
Creatinine, Ser: 1.4 mg/dL (ref 0.4–1.5)
Sodium: 142 mEq/L (ref 135–145)

## 2011-02-27 ENCOUNTER — Ambulatory Visit: Payer: Medicare Other | Admitting: Family Medicine

## 2011-03-06 ENCOUNTER — Ambulatory Visit (INDEPENDENT_AMBULATORY_CARE_PROVIDER_SITE_OTHER): Payer: Medicare Other | Admitting: Family Medicine

## 2011-03-06 ENCOUNTER — Encounter: Payer: Self-pay | Admitting: Family Medicine

## 2011-03-06 DIAGNOSIS — J449 Chronic obstructive pulmonary disease, unspecified: Secondary | ICD-10-CM

## 2011-03-06 DIAGNOSIS — G4700 Insomnia, unspecified: Secondary | ICD-10-CM

## 2011-03-06 DIAGNOSIS — I5022 Chronic systolic (congestive) heart failure: Secondary | ICD-10-CM

## 2011-03-06 MED ORDER — HYDROCODONE-ACETAMINOPHEN 7.5-500 MG PO TABS: ORAL_TABLET | ORAL | Status: DC

## 2011-03-06 MED ORDER — ZOLPIDEM TARTRATE 10 MG PO TABS: 10.0000 mg | ORAL_TABLET | Freq: Every evening | ORAL | Status: DC | PRN

## 2011-03-06 MED ORDER — TRAZODONE HCL 50 MG PO TABS: 50.0000 mg | ORAL_TABLET | Freq: Every day | ORAL | Status: DC

## 2011-03-06 NOTE — Progress Notes (Signed)
OFFICE NOTE  03/06/2011  CC:  Chief Complaint  Patient presents with  . Follow-up    on medication     HPI:   Patient is a 65 y.o. Caucasian male who is here for f/u COPD, insomnia.  He is here with his wife today. Saw cardiologist recently and still has poor EF, enalapril added and aldactone increased.  He is still smoking but only 3 cigs/day. He has had to quit cardiac rehab b/c of severe musculoskeletal pains preventing adequate participation.   Pain in various areas at various times: neck, shoulders, low back, legs. Vicodin 5/500, 2 tabs used to be effective but not so much anymore.   Says he's tolerating daliresp that we started over a month ago.  Has not had to give himself albuterol in over a month! Still having problems with initiating and maintaining sleep.  Says trazodone 50mg  + 5mg  zolpidem helped him well in the past and wants to retry this if possible.  Pertinent PMH:  Systolic Heart failure, Hx of AVR Tobacco dependence COPD Insomnia Hyperlipidemia Depression GERD  MEDS;   Outpatient Prescriptions Prior to Visit  Medication Sig Dispense Refill  . aspirin 81 MG tablet Take 81 mg by mouth daily.        . Calcium Carbonate Antacid (TUMS PO) Prn       . carvedilol (COREG) 6.25 MG tablet Take 1 tablet (6.25 mg total) by mouth 2 (two) times daily with a meal.  180 tablet  3  . chlorhexidine (PERIDEX) 0.12 % solution       . co-enzyme Q-10 30 MG capsule Take 30 mg by mouth daily.        [name] lisinopril (PRINIVIL,ZESTRIL) 5 MG tablet Take 1 tablet (5 mg total) by mouth daily.  90 tablet  3  . Mometasone Furo-Formoterol Fum (DULERA) 200-5 MCG/ACT AERO 2 puffs bid  1 Inhaler  5  . naproxen (NAPROSYN) 250 MG tablet Take 250 mg by mouth as needed.        . Omega-3 Fatty Acids (FISH OIL) 1000 MG CAPS Take 1 capsule by mouth.        [name] omeprazole (PRILOSEC) 40 MG capsule Take 1 capsule (40 mg total) by mouth daily.  30 capsule  11  . Red Yeast Rice 600 MG TABS Take 1 tablet by  mouth 2 (two) times daily.        . roflumilast (DALIRESP) 500 MCG TABS tablet Take 1 tablet (500 mcg total) by mouth daily.  35 tablet  0  . rosuvastatin (CRESTOR) 5 MG tablet Take one tablet every other day  15 tablet  3  . spironolactone (ALDACTONE) 25 MG tablet Take 1 tablet (25 mg total) by mouth daily.  90 tablet  3  . venlafaxine (EFFEXOR) 75 MG tablet Take 75 mg by mouth 3 (three) times daily.        . diazepam (VALIUM) 5 MG tablet Take 5 mg by mouth every 6 (six) hours as needed.        [name] albuterol (VENTOLIN HFA) 108 (90 BASE) MCG/ACT inhaler Inhale 2 puffs into the lungs every 6 (six) hours as needed for wheezing.  18 g  1  . buPROPion (WELLBUTRIN XL) 150 MG 24 hr tablet Take 150 mg by mouth daily.        . eszopiclone (LUNESTA) 1 MG TABS Take 1 tablet (1 mg total) by mouth at bedtime. Take immediately before bedtime  30 tablet  0  . HYDROcodone-acetaminophen (VICODIN) 5-500  MG per tablet Take 1-2 tablets by mouth every 6 (six) hours as needed for pain (Max of 6 tabs in 24 hours).  180 tablet  0    PE: Blood pressure 92/62, pulse 90, temperature 98 F (36.7 C), temperature source Oral, height 5\' 7"  (1.702 m), weight 163 lb 12.8 oz (74.299 kg), SpO2 97.00%. Gen: Alert, well appearing.  Patient is oriented to person, place, time, and situation. CV: RRR, no m/r/g.   LUNGS: CTA bilat, nonlabored resps, good aeration in all lung fields.   LABS: none today  IMPRESSION AND PLAN:  COPD Improved. Continue daliresp, dulera, and prn albuterol nebs. Encouraged pt to completely stop smoking.  Insomnia Pt states 5mg  zolpidem + trazodone 50mg  was effective, so we'll restart this.  SYSTOLIC HEART FAILURE, CHRONIC Persistent poor EF despite AVR. Cardiologist recently added lisinopril 5mg  qd and increased aldactone to a whole 25mg  tab qd. Continue coreg 6.25mg  bid  Diffuse musculoskeletal complaints: we did not have time set aside to adequately address this today.  I did increase his pain  med to 7.5mg /500 vicodin, 1-2 q6h prn.    FOLLOW UP:  Return in about 1 month (around 04/06/2011) for discuss musculoskeletal pain and f/u insomnia.

## 2011-03-06 NOTE — Assessment & Plan Note (Signed)
Persistent poor EF despite AVR. Cardiologist recently added lisinopril 5mg  qd and increased aldactone to a whole 25mg  tab qd. Continue coreg 6.25mg  bid

## 2011-03-06 NOTE — Assessment & Plan Note (Signed)
Pt states 5mg  zolpidem + trazodone 50mg  was effective, so we'll restart this.

## 2011-03-06 NOTE — Assessment & Plan Note (Signed)
Improved. Continue daliresp, dulera, and prn albuterol nebs. Encouraged pt to completely stop smoking.

## 2011-03-06 NOTE — Progress Notes (Signed)
Addended by: Andrew Au on: 03/06/2011 02:58 PM   Modules accepted: Orders

## 2011-04-02 IMAGING — CR DG CHEST 2V
2 series · 2 of 2 positions shown · non-contrast
Comparison: 06/04/2010

CLINICAL DATA: Hypertension.  Tobacco use.  COPD.

CHEST - 2 VIEW

[view not recorded (1 of 2)]
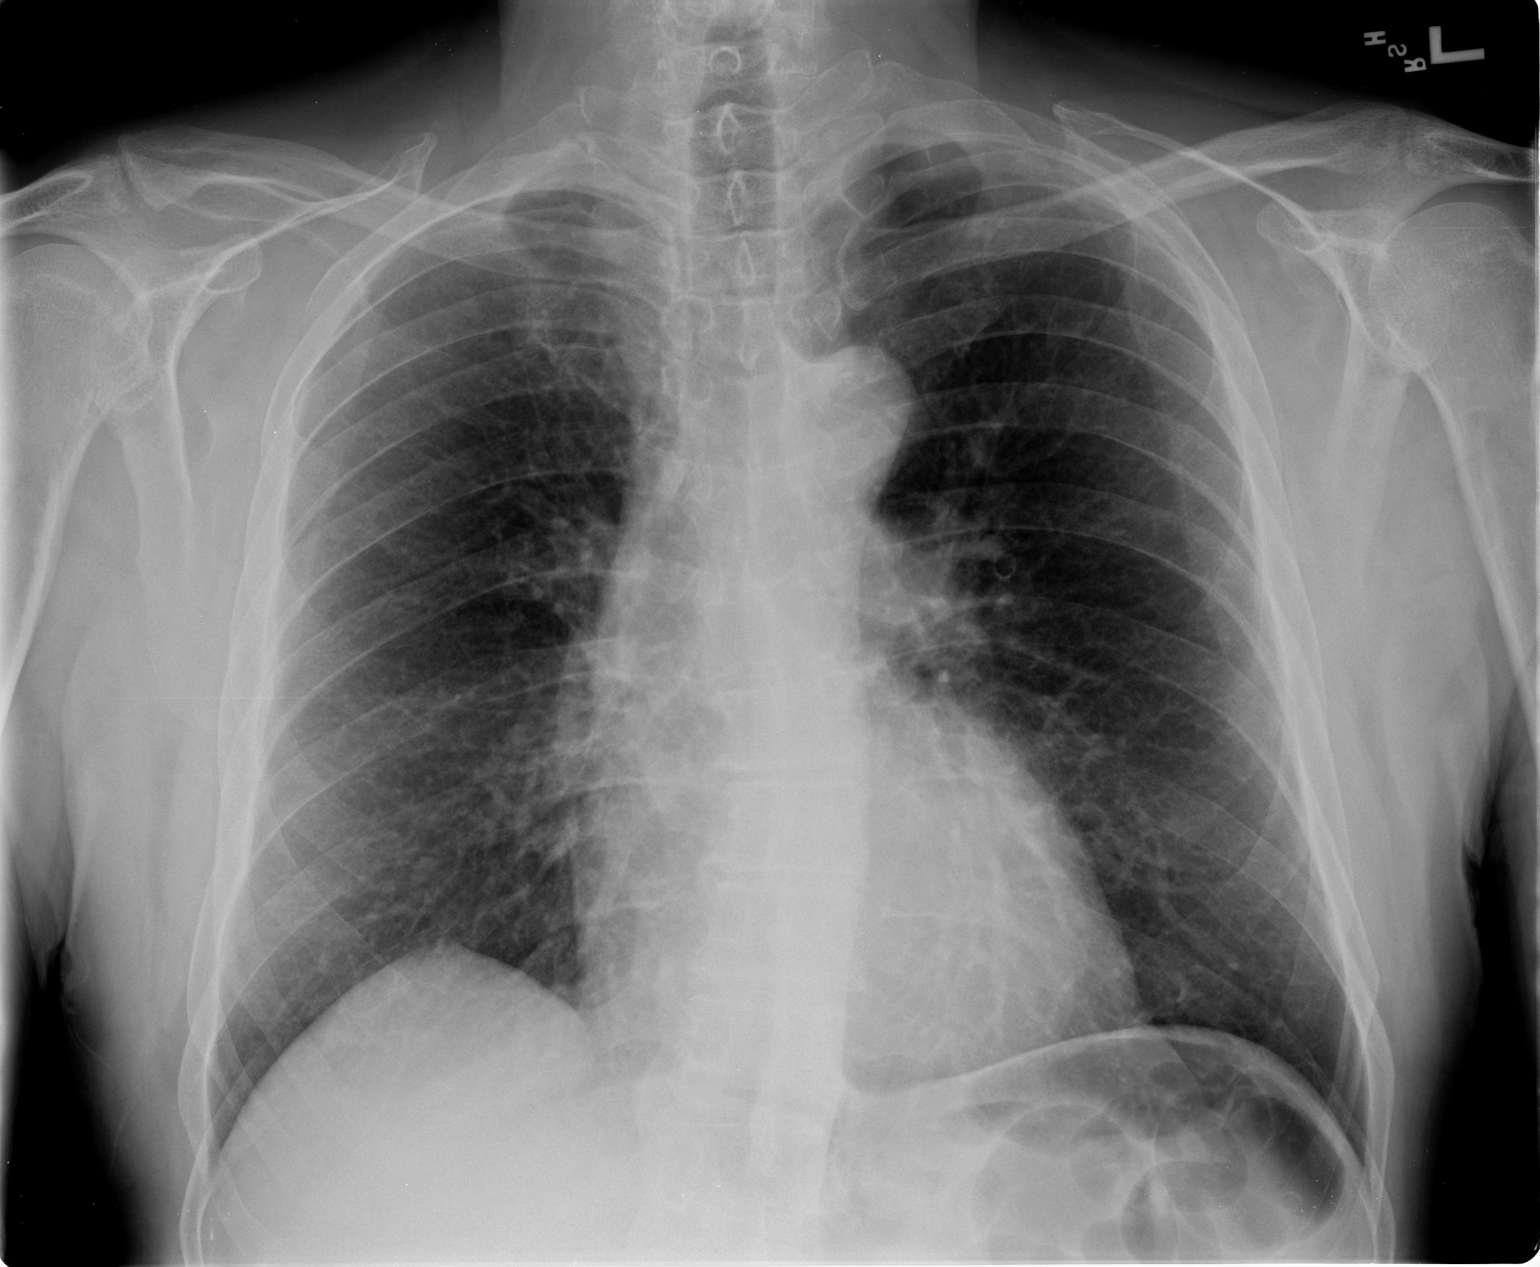

[view not recorded (2 of 2)]
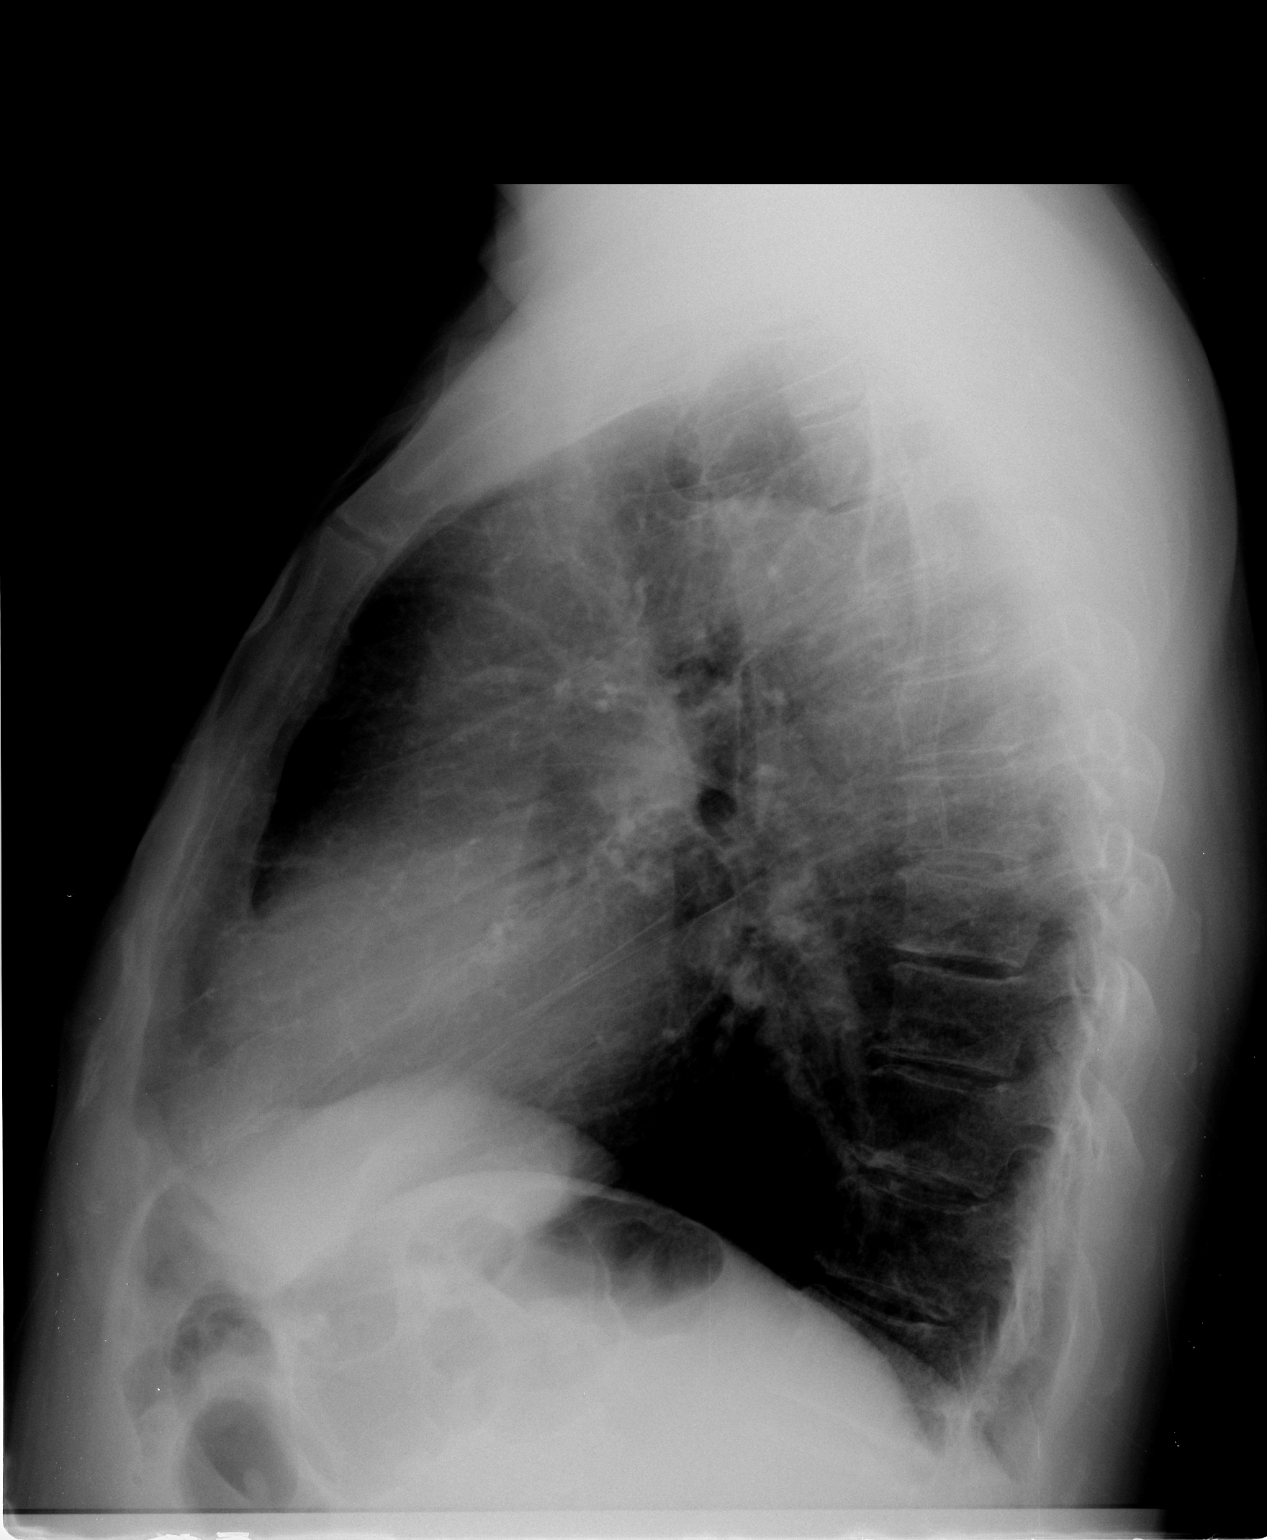

[2 of 2 positions shown; findings below may reference images not displayed]

FINDINGS: Emphysema noted.  The lungs appear otherwise clear.

Cardiac and mediastinal contours appear unremarkable.

No pleural effusion identified.
IMPRESSION: 1.  Emphysema.

## 2011-04-06 ENCOUNTER — Ambulatory Visit: Payer: Medicare Other | Admitting: Family Medicine

## 2011-04-14 ENCOUNTER — Encounter: Payer: Self-pay | Admitting: Cardiology

## 2011-04-14 ENCOUNTER — Ambulatory Visit (INDEPENDENT_AMBULATORY_CARE_PROVIDER_SITE_OTHER): Payer: Medicare Other | Admitting: Cardiology

## 2011-04-14 VITALS — BP 90/56 | HR 72 | Ht 66.0 in | Wt 160.0 lb

## 2011-04-14 DIAGNOSIS — I359 Nonrheumatic aortic valve disorder, unspecified: Secondary | ICD-10-CM

## 2011-04-14 DIAGNOSIS — I714 Abdominal aortic aneurysm, without rupture: Secondary | ICD-10-CM

## 2011-04-14 DIAGNOSIS — I2510 Atherosclerotic heart disease of native coronary artery without angina pectoris: Secondary | ICD-10-CM

## 2011-04-14 DIAGNOSIS — I5022 Chronic systolic (congestive) heart failure: Secondary | ICD-10-CM

## 2011-04-14 LAB — BASIC METABOLIC PANEL
GFR: 69.09 mL/min (ref >60.00)
Potassium: 3.8 mEq/L (ref 3.5–5.1)
Sodium: 140 mEq/L (ref 135–145)

## 2011-04-14 MED ORDER — ROSUVASTATIN CALCIUM 5 MG PO TABS: 5.0000 mg | ORAL_TABLET | Freq: Every day | ORAL | Status: DC

## 2011-04-14 MED ORDER — LISINOPRIL 5 MG PO TABS: ORAL_TABLET | ORAL | Status: DC

## 2011-04-14 NOTE — Patient Instructions (Signed)
Increase lisinopril to 5mg  in the morning and 2.5mg  in the afternoon about 4pm--this will be one 5mg  tablet in the morning and one-half 5mg  tablet in the afternoon about 4pm.  Take Crestor 5mg  daily.  Your physician recommends that you have lab work today--BMET 428.22  Keep the appointments you have scheduled for the abdominal aorta ultrasound and echocardiogram March 4,2013 at 9:30am  Your physician recommends that you schedule a follow-up appointment a few days after you have the tests done March 4,2013

## 2011-04-14 NOTE — Assessment & Plan Note (Signed)
Abdominal US in 3/13.  

## 2011-04-14 NOTE — Assessment & Plan Note (Signed)
EF remains 15% post-AVR (nonischemic CMP).  NYHA class II-III symptoms.  He does not appear volume overloaded on exam.  - Continue Coreg, spironolactone - Increase lisinopril to 5 mg qam, 2.5 mg qpm.   - BMET/BNP today - Echo in 3/13 (6 months after AVR).  If EF is still low, will need BiV-ICD (has wide LBBB).

## 2011-04-14 NOTE — Assessment & Plan Note (Signed)
Marvin Bell seems to be stable following his high risk AVR. The valve appeared well-seated by the last echo with mild AI.
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
## 2011-04-14 NOTE — Assessment & Plan Note (Signed)
Occluded OM3, otherwise no significant disease.  Continue ASA 81 mg daily.  Marvin Bell should start back on Crestor with goal LDL < 70.  He has not had any side effects.

## 2011-04-20 ENCOUNTER — Ambulatory Visit (INDEPENDENT_AMBULATORY_CARE_PROVIDER_SITE_OTHER): Payer: Medicare Other | Admitting: Family Medicine

## 2011-04-20 ENCOUNTER — Encounter: Payer: Self-pay | Admitting: Family Medicine

## 2011-04-20 DIAGNOSIS — M67919 Unspecified disorder of synovium and tendon, unspecified shoulder: Secondary | ICD-10-CM

## 2011-04-20 DIAGNOSIS — M25519 Pain in unspecified shoulder: Secondary | ICD-10-CM

## 2011-04-20 DIAGNOSIS — M7580 Other shoulder lesions, unspecified shoulder: Secondary | ICD-10-CM

## 2011-04-20 DIAGNOSIS — M25511 Pain in right shoulder: Secondary | ICD-10-CM

## 2011-04-20 MED ORDER — METHYLPREDNISOLONE ACETATE PF 40 MG/ML IJ SUSP: 40.0000 mg | Freq: Once | INTRAMUSCULAR | Status: DC
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
## 2011-05-06 ENCOUNTER — Other Ambulatory Visit: Payer: Self-pay | Admitting: *Deleted

## 2011-05-06 NOTE — Telephone Encounter (Signed)
Last regular follow up on 12/7 with 1 month follow up needed.  Not scheduled.  Seen on 1/21 for shoulder pain.  No follow up plan noted.  Please advise RX and follow up.

## 2011-05-07 MED ORDER — HYDROCODONE-ACETAMINOPHEN 7.5-500 MG PO TABS: ORAL_TABLET | ORAL | Status: DC

## 2011-05-07 NOTE — Telephone Encounter (Signed)
RX faxed to [facility]  

## 2011-05-19 ENCOUNTER — Ambulatory Visit (HOSPITAL_BASED_OUTPATIENT_CLINIC_OR_DEPARTMENT_OTHER)
Admission: RE | Admit: 2011-05-19 | Discharge: 2011-05-19 | Disposition: A | Discharge status: Home / Self Care | Payer: Medicare Other | Source: Ambulatory Visit | Attending: Family Medicine | Admitting: Family Medicine

## 2011-05-19 DIAGNOSIS — M25511 Pain in right shoulder: Secondary | ICD-10-CM

## 2011-05-19 DIAGNOSIS — M7580 Other shoulder lesions, unspecified shoulder: Secondary | ICD-10-CM

## 2011-05-19 DIAGNOSIS — M25519 Pain in unspecified shoulder: Secondary | ICD-10-CM | POA: Insufficient documentation

## 2011-05-25 ENCOUNTER — Encounter: Payer: Self-pay | Admitting: Family Medicine

## 2011-05-25 ENCOUNTER — Ambulatory Visit (INDEPENDENT_AMBULATORY_CARE_PROVIDER_SITE_OTHER): Payer: Medicare Other | Admitting: Family Medicine

## 2011-05-25 VITALS — BP 93/57 | HR 77 | Temp 98.0°F | Wt 162.0 lb

## 2011-05-25 DIAGNOSIS — R0789 Other chest pain: Secondary | ICD-10-CM

## 2011-05-25 DIAGNOSIS — M719 Bursopathy, unspecified: Secondary | ICD-10-CM

## 2011-05-25 DIAGNOSIS — I359 Nonrheumatic aortic valve disorder, unspecified: Secondary | ICD-10-CM

## 2011-05-25 MED ORDER — ROFLUMILAST 500 MCG PO TABS: 500.0000 ug | ORAL_TABLET | Freq: Every day | ORAL | Status: DC

## 2011-05-25 MED ORDER — DIAZEPAM 10 MG PO TABS: 10.0000 mg | ORAL_TABLET | Freq: Three times a day (TID) | ORAL | Status: AC | PRN

## 2011-05-25 NOTE — Assessment & Plan Note (Signed)
Resolved s/p intra-articular steroid injection 1 mo ago. Monitor.  No further treatment for now.

## 2011-05-25 NOTE — Assessment & Plan Note (Signed)
I think his chest pain is multifactorial: myofascial/musculoskeletal, anxiety, GERD, possibly pleurisy. Reassured pt.  Continue current cardiac and pulmonary medications. Continue current anxiety medication.
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
## 2011-05-25 NOTE — Assessment & Plan Note (Signed)
With chronically depressed LV systolic function s/p AVR. Dr. Freida Busman to do another ECHO coming up soon.

## 2011-06-01 ENCOUNTER — Other Ambulatory Visit: Payer: Self-pay

## 2011-06-01 ENCOUNTER — Encounter (INDEPENDENT_AMBULATORY_CARE_PROVIDER_SITE_OTHER): Payer: Medicare Other

## 2011-06-01 ENCOUNTER — Ambulatory Visit (HOSPITAL_COMMUNITY): Payer: Medicare Other | Attending: Internal Medicine

## 2011-06-01 DIAGNOSIS — R079 Chest pain, unspecified: Secondary | ICD-10-CM | POA: Insufficient documentation

## 2011-06-01 DIAGNOSIS — F17200 Nicotine dependence, unspecified, uncomplicated: Secondary | ICD-10-CM | POA: Insufficient documentation

## 2011-06-01 DIAGNOSIS — I359 Nonrheumatic aortic valve disorder, unspecified: Secondary | ICD-10-CM | POA: Insufficient documentation

## 2011-06-01 DIAGNOSIS — R072 Precordial pain: Secondary | ICD-10-CM

## 2011-06-01 DIAGNOSIS — I714 Abdominal aortic aneurysm, without rupture: Secondary | ICD-10-CM

## 2011-06-01 DIAGNOSIS — J449 Chronic obstructive pulmonary disease, unspecified: Secondary | ICD-10-CM | POA: Insufficient documentation

## 2011-06-01 DIAGNOSIS — J4489 Other specified chronic obstructive pulmonary disease: Secondary | ICD-10-CM | POA: Insufficient documentation

## 2011-06-01 DIAGNOSIS — I5022 Chronic systolic (congestive) heart failure: Secondary | ICD-10-CM

## 2011-06-05 ENCOUNTER — Ambulatory Visit (INDEPENDENT_AMBULATORY_CARE_PROVIDER_SITE_OTHER): Payer: Medicare Other | Admitting: Cardiology

## 2011-06-05 ENCOUNTER — Encounter: Payer: Self-pay | Admitting: Cardiology

## 2011-06-05 VITALS — BP 110/76 | HR 79 | Wt 161.8 lb

## 2011-06-05 DIAGNOSIS — I5022 Chronic systolic (congestive) heart failure: Secondary | ICD-10-CM

## 2011-06-05 DIAGNOSIS — I714 Abdominal aortic aneurysm, without rupture: Secondary | ICD-10-CM

## 2011-06-05 DIAGNOSIS — I359 Nonrheumatic aortic valve disorder, unspecified: Secondary | ICD-10-CM

## 2011-06-05 DIAGNOSIS — I2510 Atherosclerotic heart disease of native coronary artery without angina pectoris: Secondary | ICD-10-CM

## 2011-06-05 MED ORDER — LISINOPRIL 5 MG PO TABS: 5.0000 mg | ORAL_TABLET | Freq: Two times a day (BID) | ORAL | Status: DC

## 2011-06-05 NOTE — Assessment & Plan Note (Signed)
Occluded OM3, otherwise no significant disease.  Continue ASA 81 mg daily and Crestor.
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
## 2011-06-05 NOTE — Assessment & Plan Note (Signed)
EF remains 20-25% 6 months post-AVR (nonischemic CMP).  NYHA class II-III symptoms.  He does not appear volume overloaded on exam.  - Continue Coreg, spironolactone at current doses. - Increase lisinopril to 5 mg bid.    - BMET/BNP today in 2 wks.   - Persistent low EF with NYHA class II-III symptoms and LBBB: will refer to EP for CRT-D.

## 2011-06-05 NOTE — Patient Instructions (Addendum)
Your physician recommends that you schedule a follow-up appointment in: 2 months with Dr Earlean Shawl have been referred to Electrophysiology for a biventricular ICD  Your physician has recommended you make the following change in your medication: Increase your lisinopril to 5mg  twice daily   Your physician recommends that you return for lab work in: 2 weeks  (BMET, BNP)

## 2011-06-05 NOTE — Assessment & Plan Note (Signed)
Repeat abdominal US in 3/14.  

## 2011-06-05 NOTE — Assessment & Plan Note (Signed)
Marvin Bell seems to be stable following his high risk AVR. The valve appeared well-seated by the last echo.

## 2011-06-09 NOTE — Progress Notes (Signed)
Addended by: Judithe Modest D on: 06/09/2011 04:49 PM   Modules accepted: Orders

## 2011-06-12 ENCOUNTER — Telehealth: Payer: Self-pay | Admitting: *Deleted

## 2011-06-12 MED ORDER — VENLAFAXINE HCL 75 MG PO TABS: 75.0000 mg | ORAL_TABLET | Freq: Three times a day (TID) | ORAL | Status: DC

## 2011-06-12 MED ORDER — AZITHROMYCIN 250 MG PO TABS: ORAL_TABLET | ORAL | Status: AC

## 2011-06-12 NOTE — Telephone Encounter (Signed)
Z-pack sent to [facility] pharmacy--PM

## 2011-06-12 NOTE — Telephone Encounter (Signed)
Triage VM left stating he is still having right ear ache, tooth ache and cough that is worse at night that was discussed at last visit in 05/2011.  Has tried 2-3 OTC pills but can not tell me the name.  Would like RX called to [facility]. Has not filled Valium RX.  Needs refill on Effexor.  Will call to [facility].

## 2011-06-16 ENCOUNTER — Other Ambulatory Visit (INDEPENDENT_AMBULATORY_CARE_PROVIDER_SITE_OTHER): Payer: Medicare Other

## 2011-06-16 DIAGNOSIS — I5022 Chronic systolic (congestive) heart failure: Secondary | ICD-10-CM

## 2011-06-16 DIAGNOSIS — R0609 Other forms of dyspnea: Secondary | ICD-10-CM

## 2011-06-16 LAB — BRAIN NATRIURETIC PEPTIDE: Pro B Natriuretic peptide (BNP): 71 pg/mL (ref 0.0–100.0)

## 2011-06-16 LAB — BASIC METABOLIC PANEL
Chloride: 103 mEq/L (ref 96–112)
GFR: 62.03 mL/min (ref >60.00)
Potassium: 4.2 mEq/L (ref 3.5–5.1)
Sodium: 139 mEq/L (ref 135–145)

## 2011-06-17 ENCOUNTER — Telehealth: Payer: Self-pay

## 2011-06-17 MED ORDER — RAMIPRIL 5 MG PO CAPS: 5.0000 mg | ORAL_CAPSULE | Freq: Every day | ORAL | Status: DC

## 2011-06-17 NOTE — Telephone Encounter (Signed)
Mr Kronberg is requesting that his lisinopril be discontinued and changed to a different medication.  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  The more he gets depressed, the more rectal bleeding he has.  He states he has been on effexor for years which has been controlling the depression well up to now.  He states the lisinopril has caused his depression to get worse and cause rectal bleeding.  When he was on the 5mg /day dose, it was ok.  After increasing his lisinopril to 10mg /day, he states he started having rectal bleeding.  He stopped the lisinopril and the rectal bleeding stopped.  Mr Craze would like Dr Shirlee Latch to be aware of all this and wants his lisinopril to be changed.  Mr Laurent states that his wife researched the lisinopril on the internet and read that the lisinopril has a blood thinner in it.  He wants to make sure that the medication he gets changed to does not have a blood thinner in it.

## 2011-06-17 NOTE — Telephone Encounter (Signed)
Marvin Bell agrees to try the Ramipril.  Rx was escribed to [facility] per pt request.

## 2011-06-17 NOTE — Telephone Encounter (Signed)
Lisinopril does not have a blood thinner in it.  Not sure where they found that.  He can try switching to ramipril 5 mg daily.  I don't think lisinopril is likely to be contributing to the rectal bleeding though. Probably coincidental.

## 2011-06-22 ENCOUNTER — Telehealth: Payer: Self-pay | Admitting: *Deleted

## 2011-06-22 MED ORDER — VENLAFAXINE HCL ER 75 MG PO CP24: 75.0000 mg | ORAL_CAPSULE | Freq: Three times a day (TID) | ORAL | Status: DC
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
## 2011-07-02 ENCOUNTER — Encounter: Payer: Self-pay | Admitting: *Deleted

## 2011-07-02 ENCOUNTER — Encounter: Payer: Self-pay | Admitting: Internal Medicine

## 2011-07-02 ENCOUNTER — Ambulatory Visit (INDEPENDENT_AMBULATORY_CARE_PROVIDER_SITE_OTHER): Payer: Medicare Other | Admitting: Internal Medicine

## 2011-07-02 VITALS — BP 90/58 | HR 86 | Ht 66.0 in | Wt 163.1 lb

## 2011-07-02 DIAGNOSIS — I428 Other cardiomyopathies: Secondary | ICD-10-CM | POA: Insufficient documentation

## 2011-07-02 DIAGNOSIS — I359 Nonrheumatic aortic valve disorder, unspecified: Secondary | ICD-10-CM

## 2011-07-02 DIAGNOSIS — I447 Left bundle-branch block, unspecified: Secondary | ICD-10-CM

## 2011-07-02 DIAGNOSIS — I5022 Chronic systolic (congestive) heart failure: Secondary | ICD-10-CM

## 2011-07-02 NOTE — Assessment & Plan Note (Signed)
As above.

## 2011-07-02 NOTE — Patient Instructions (Signed)

## 2011-07-02 NOTE — Assessment & Plan Note (Signed)
Status post bioprosthetic replacement; the x-ray was reviewed. No epicardial leads were placed
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
## 2011-07-02 NOTE — Assessment & Plan Note (Addendum)
The patient has persistent left ventricular dysfunction following valvular replacement for aortic stenosis. He is a broad QRS with a left branch block morphology and persistent congestive symptoms at class 2-3. We have discussed the role of CRT ICD implantation for improvement in symptoms and reduction risk of cardiac arrest. He understands these risks. He and his family are willing to proceed.  He has been for a long period of time on guidelines directed medical therapy

## 2011-07-03 ENCOUNTER — Other Ambulatory Visit: Payer: Self-pay | Admitting: *Deleted

## 2011-07-03 ENCOUNTER — Encounter (HOSPITAL_COMMUNITY): Payer: Self-pay | Admitting: Respiratory Therapy

## 2011-07-03 DIAGNOSIS — I428 Other cardiomyopathies: Secondary | ICD-10-CM

## 2011-07-03 LAB — BASIC METABOLIC PANEL
CO2: 30 mEq/L (ref 19–32)
Calcium: 9.2 mg/dL (ref 8.4–10.5)
Creatinine, Ser: 1.2 mg/dL (ref 0.4–1.5)
GFR: 64.42 mL/min (ref >60.00)
Sodium: 140 mEq/L (ref 135–145)

## 2011-07-03 LAB — CBC WITH DIFFERENTIAL/PLATELET
Basophils Relative: 0.9 % (ref 0.0–3.0)
Eosinophils Absolute: 0.7 10*3/uL (ref 0.0–0.7)
Hemoglobin: 13.8 g/dL (ref 13.0–17.0)
Lymphocytes Relative: 26.5 % (ref 12.0–46.0)
MCHC: 33.2 g/dL (ref 30.0–36.0)
Monocytes Relative: 4.9 % (ref 3.0–12.0)
Neutrophils Relative %: 60.4 % (ref 43.0–77.0)
RBC: 4.34 Mil/uL (ref 4.22–5.81)
WBC: 9.3 10*3/uL (ref 4.5–10.5)

## 2011-07-03 LAB — PROTIME-INR: INR: 1 ratio (ref 0.8–1.0)

## 2011-07-07 MED ORDER — VANCOMYCIN HCL IN DEXTROSE 1-5 GM/200ML-% IV SOLN
1000.0000 mg | INTRAVENOUS | Status: AC
  Administered 2011-07-08: 1000 mg via INTRAVENOUS
  Filled 2011-07-07: qty 200

## 2011-07-07 MED ORDER — SODIUM CHLORIDE 0.9 % IR SOLN
80.0000 mg | Status: AC
  Administered 2011-07-08: 80 mg
  Filled 2011-07-07: qty 2

## 2011-07-08 ENCOUNTER — Encounter (HOSPITAL_COMMUNITY)
Admission: RE | Disposition: A | Discharge status: Home / Self Care | Payer: Self-pay | Source: Ambulatory Visit | Attending: Internal Medicine

## 2011-07-08 ENCOUNTER — Encounter (HOSPITAL_COMMUNITY): Payer: Self-pay | Admitting: General Practice

## 2011-07-08 ENCOUNTER — Ambulatory Visit (HOSPITAL_COMMUNITY)
Admission: RE | Admit: 2011-07-08 | Discharge: 2011-07-09 | Disposition: A | Discharge status: Home / Self Care | Payer: Medicare Other | Source: Ambulatory Visit | Attending: Internal Medicine | Admitting: Internal Medicine

## 2011-07-08 DIAGNOSIS — I359 Nonrheumatic aortic valve disorder, unspecified: Secondary | ICD-10-CM

## 2011-07-08 DIAGNOSIS — I509 Heart failure, unspecified: Secondary | ICD-10-CM | POA: Insufficient documentation

## 2011-07-08 DIAGNOSIS — I447 Left bundle-branch block, unspecified: Secondary | ICD-10-CM | POA: Insufficient documentation

## 2011-07-08 DIAGNOSIS — I428 Other cardiomyopathies: Secondary | ICD-10-CM

## 2011-07-08 DIAGNOSIS — J449 Chronic obstructive pulmonary disease, unspecified: Secondary | ICD-10-CM | POA: Insufficient documentation

## 2011-07-08 DIAGNOSIS — I5022 Chronic systolic (congestive) heart failure: Secondary | ICD-10-CM

## 2011-07-08 DIAGNOSIS — J4489 Other specified chronic obstructive pulmonary disease: Secondary | ICD-10-CM | POA: Insufficient documentation

## 2011-07-08 DIAGNOSIS — Z954 Presence of other heart-valve replacement: Secondary | ICD-10-CM | POA: Insufficient documentation

## 2011-07-08 DIAGNOSIS — I059 Rheumatic mitral valve disease, unspecified: Secondary | ICD-10-CM | POA: Insufficient documentation

## 2011-07-08 HISTORY — DX: Acute myocardial infarction, unspecified: I21.9

## 2011-07-08 HISTORY — DX: Presence of cardiac pacemaker: Z95.0

## 2011-07-08 HISTORY — DX: Presence of automatic (implantable) cardiac defibrillator: Z95.810

## 2011-07-08 HISTORY — DX: Pneumonia, unspecified organism: J18.9

## 2011-07-08 HISTORY — PX: INSERT / REPLACE / REMOVE PACEMAKER: SUR710

## 2011-07-08 HISTORY — DX: Umbilical hernia without obstruction or gangrene: K42.9

## 2011-07-08 HISTORY — PX: BI-VENTRICULAR IMPLANTABLE CARDIOVERTER DEFIBRILLATOR: SHX5459

## 2011-07-08 HISTORY — DX: Adverse effect of unspecified anesthetic, initial encounter: T41.45XA

## 2011-07-08 HISTORY — DX: Gastrointestinal hemorrhage, unspecified: K92.2

## 2011-07-08 HISTORY — DX: Shortness of breath: R06.02

## 2011-07-08 LAB — SURGICAL PCR SCREEN
MRSA, PCR: NEGATIVE
Staphylococcus aureus: NEGATIVE

## 2011-07-08 SURGERY — BI-VENTRICULAR IMPLANTABLE CARDIOVERTER DEFIBRILLATOR  (CRT-D)
Anesthesia: LOCAL

## 2011-07-08 MED ORDER — HYDROCODONE-ACETAMINOPHEN 5-325 MG PO TABS
1.0000 | ORAL_TABLET | Freq: Four times a day (QID) | ORAL | Status: DC | PRN
  Administered 2011-07-08 – 2011-07-09 (×2): 1 via ORAL
  Filled 2011-07-08 (×2): qty 1

## 2011-07-08 MED ORDER — FENTANYL CITRATE 0.05 MG/ML IJ SOLN
INTRAMUSCULAR | Status: AC
  Filled 2011-07-08: qty 2

## 2011-07-08 MED ORDER — ONDANSETRON HCL 4 MG/2ML IJ SOLN: 4.0000 mg | Freq: Four times a day (QID) | INTRAMUSCULAR | Status: DC | PRN

## 2011-07-08 MED ORDER — MIDAZOLAM HCL 5 MG/5ML IJ SOLN
INTRAMUSCULAR | Status: AC
  Filled 2011-07-08: qty 5

## 2011-07-08 MED ORDER — ACETAMINOPHEN 325 MG PO TABS: 325.0000 mg | ORAL_TABLET | ORAL | Status: DC | PRN

## 2011-07-08 MED ORDER — HEPARIN (PORCINE) IN NACL 2-0.9 UNIT/ML-% IJ SOLN
INTRAMUSCULAR | Status: AC
  Filled 2011-07-08: qty 1000

## 2011-07-08 MED ORDER — SPIRONOLACTONE 25 MG PO TABS
25.0000 mg | ORAL_TABLET | Freq: Every day | ORAL | Status: DC
  Administered 2011-07-08 – 2011-07-09 (×2): 25 mg via ORAL
  Filled 2011-07-08 (×2): qty 1

## 2011-07-08 MED ORDER — MOMETASONE FURO-FORMOTEROL FUM 200-5 MCG/ACT IN AERO: 2.0000 | INHALATION_SPRAY | Freq: Two times a day (BID) | RESPIRATORY_TRACT | Status: DC | PRN

## 2011-07-08 MED ORDER — MUPIROCIN 2 % EX OINT
TOPICAL_OINTMENT | CUTANEOUS | Status: AC
  Administered 2011-07-08: 1 via NASAL
  Filled 2011-07-08: qty 22

## 2011-07-08 MED ORDER — VANCOMYCIN HCL IN DEXTROSE 1-5 GM/200ML-% IV SOLN
INTRAVENOUS | Status: AC
  Filled 2011-07-08: qty 200

## 2011-07-08 MED ORDER — SODIUM CHLORIDE 0.45 % IV SOLN
INTRAVENOUS | Status: DC
  Administered 2011-07-08: 12:00:00 via INTRAVENOUS

## 2011-07-08 MED ORDER — FLUTICASONE-SALMETEROL 250-50 MCG/DOSE IN AEPB
1.0000 | INHALATION_SPRAY | Freq: Two times a day (BID) | RESPIRATORY_TRACT | Status: DC | PRN
  Filled 2011-07-08: qty 14

## 2011-07-08 MED ORDER — CARVEDILOL 6.25 MG PO TABS
6.2500 mg | ORAL_TABLET | Freq: Two times a day (BID) | ORAL | Status: DC
  Administered 2011-07-08 – 2011-07-09 (×3): 6.25 mg via ORAL
  Filled 2011-07-08 (×4): qty 1

## 2011-07-08 MED ORDER — VENLAFAXINE HCL ER 75 MG PO CP24
75.0000 mg | ORAL_CAPSULE | Freq: Three times a day (TID) | ORAL | Status: DC
  Administered 2011-07-08 – 2011-07-09 (×4): 75 mg via ORAL
  Filled 2011-07-08 (×5): qty 1

## 2011-07-08 MED ORDER — SODIUM CHLORIDE 0.9 % IV SOLN
INTRAVENOUS | Status: AC
  Administered 2011-07-08: 16:00:00 via INTRAVENOUS

## 2011-07-08 MED ORDER — ATORVASTATIN CALCIUM 10 MG PO TABS
10.0000 mg | ORAL_TABLET | Freq: Every day | ORAL | Status: DC
  Administered 2011-07-08: 10 mg via ORAL
  Filled 2011-07-08 (×2): qty 1

## 2011-07-08 MED ORDER — CHLORHEXIDINE GLUCONATE 4 % EX LIQD
60.0000 mL | Freq: Once | CUTANEOUS | Status: DC
  Filled 2011-07-08: qty 60

## 2011-07-08 MED ORDER — ALBUTEROL SULFATE HFA 108 (90 BASE) MCG/ACT IN AERS
2.0000 | INHALATION_SPRAY | Freq: Four times a day (QID) | RESPIRATORY_TRACT | Status: DC | PRN
  Filled 2011-07-08: qty 6.7

## 2011-07-08 MED ORDER — RAMIPRIL 5 MG PO CAPS
5.0000 mg | ORAL_CAPSULE | Freq: Every day | ORAL | Status: DC
  Administered 2011-07-08: 5 mg via ORAL
  Filled 2011-07-08 (×2): qty 1

## 2011-07-08 MED ORDER — SODIUM CHLORIDE 0.9 % IV SOLN
INTRAVENOUS | Status: DC
  Administered 2011-07-08: 10:00:00 via INTRAVENOUS

## 2011-07-08 MED ORDER — ASPIRIN EC 81 MG PO TBEC
81.0000 mg | DELAYED_RELEASE_TABLET | Freq: Every day | ORAL | Status: DC
  Administered 2011-07-08 – 2011-07-09 (×2): 81 mg via ORAL
  Filled 2011-07-08 (×2): qty 1

## 2011-07-08 MED ORDER — ASPIRIN 81 MG PO TABS: 81.0000 mg | ORAL_TABLET | Freq: Every day | ORAL | Status: DC

## 2011-07-08 MED ORDER — VANCOMYCIN HCL IN DEXTROSE 1-5 GM/200ML-% IV SOLN
1000.0000 mg | Freq: Two times a day (BID) | INTRAVENOUS | Status: AC
  Administered 2011-07-09: 1000 mg via INTRAVENOUS
  Filled 2011-07-08: qty 200

## 2011-07-08 MED ORDER — LIDOCAINE HCL (PF) 1 % IJ SOLN
INTRAMUSCULAR | Status: AC
  Filled 2011-07-08: qty 60

## 2011-07-08 NOTE — CV Procedure (Signed)
preop dx  NICM/valvular CM with LBBB adn CHF POST op DX  Same     Procedure::CRT implant with aborted LV placement 2/2 inabilty to find a lateral vein and posterior and anterior veins had RV-LV intervals of <60 msec with QRS duration of 200 msec  Dictation [id number]
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
## 2011-07-08 NOTE — Interval H&P Note (Signed)
History and Physical Interval Note:  07/08/2011 10:26 AM  Marvin Bell  has presented today for surgery, with the diagnosis of Heart failure  The various methods of treatment have been discussed with the patient and family. After consideration of risks, benefits and other options for treatment, the patient has consented to  Procedure(s) (LRB): BI-VENTRICULAR IMPLANTABLE CARDIOVERTER DEFIBRILLATOR  (CRT-D) (N/A) as a surgical intervention .  The patients' history has been reviewed, patient examined, no change in status, stable for surgery.  I have reviewed the patients' chart and labs.  Questions were answered to the patient's satisfaction.     Sherryl Manges  Has been having this chronic cough x 8 mnths

## 2011-07-08 NOTE — Interval H&P Note (Signed)
History and Physical Interval Note:  07/08/2011 10:24 AM  Marvin Bell  has presented today for surgery, with the diagnosis of Heart failure  The various methods of treatment have been discussed with the patient and family. After consideration of risks, benefits and other options for treatment, the patient has consented to  Procedure(s) (LRB): BI-VENTRICULAR IMPLANTABLE CARDIOVERTER DEFIBRILLATOR  (CRT-D) (N/A) as a surgical intervention .  The patients' history has been reviewed, patient examined, no change in status, stable for surgery.  I have reviewed the patients' chart and labs.  Questions were answered to the patient's satisfaction.     Sherryl Manges

## 2011-07-09 ENCOUNTER — Ambulatory Visit (HOSPITAL_COMMUNITY): Payer: Medicare Other

## 2011-07-09 DIAGNOSIS — I428 Other cardiomyopathies: Secondary | ICD-10-CM

## 2011-07-09 MED ORDER — YOU HAVE A PACEMAKER BOOK
Freq: Once | Status: DC
Start: 2011-07-09 — End: 2011-07-09
  Filled 2011-07-09: qty 1

## 2011-07-09 NOTE — Discharge Summary (Signed)
CARDIOLOGY DISCHARGE SUMMARY   Patient ID: Marvin Bell MRN: [id number] DOB/AGE: 05/05/1945 66 y.o.  Admit date: 07/08/2011 Discharge date: 07/09/2011  Primary Discharge Diagnosis:  Dilated cardiomyopathy Secondary Discharge Diagnosis:  Patient Active Problem List  Diagnoses  . DEPRESSION  . COPD  . HYPERLIPIDEMIA-MIXED  . AORTIC VALVE DISORDERS  . SYSTOLIC HEART FAILURE, CHRONIC  . SHORTNESS OF BREATH  . Chronic kidney disease (CKD), stage III (moderate)  . AAA  . Smoking  . Insomnia  . Atypical chest pain  . Nonischemic cardiomyopathy  . Left bundle branch block   Procedures: Dual-chamber defibrillator implantation with Medtronic Delta 314TRG cardiac resynchronization device, serial number [id number], aborted left ventricular lead placement, 2 view CXR  Hospital Course: Marvin Bell is a 66 year old male with a history of DCM. He was seen by Dr Graciela Husbands and scheduled for an ICD. He came to the hospital for the procedure on 07/08/2011.   He had the procedure described above without complication. Dr Graciela Husbands will look into epicardial lead placement by [facility] in the future.   On 07/09/2011, Marvin Bell was seen by Dr Graciela Husbands. His device was interrogated and the CXR was reviewed. He is considered stable for discharge, to follow up as an outpatient.   Labs:   Lab Results  Component Value Date   WBC 9.3 07/02/2011   HGB 13.8 07/02/2011   HCT 41.6 07/02/2011   MCV 95.8 07/02/2011   PLT 225.0 07/02/2011       Radiology:  Dg Chest 2 View 07/09/2011  *RADIOLOGY REPORT*  Clinical Data: Post pacemaker placement.  CHEST - 2 VIEW  Comparison: 06/20/2010.  Findings: Trachea is midline.  Heart size normal.  Pacemaker and AICD lead tips project over the right atrium and right ventricle, respectively.  Interval median sternotomy.  Mild biapical pleural parenchymal scarring.  Lungs are otherwise clear. No pneumothorax. No pleural fluid.  IMPRESSION:  1.  Interval pacemaker/AICD placement without complicating  feature. 2.  No acute findings.  Original Report Authenticated By: Reyes Ivan, M.D.    FOLLOW UP PLANS AND APPOINTMENTS Discharge Orders    Future Appointments: Provider: Department: Dept Phone: Center:   08/07/2011 2:30 PM Laurey Morale, MD [facility] [place] [phone] [facility]   08/21/2011 1:30 PM Jeoffrey Massed, MD [facility] [phone] None     Allergies  Allergen Reactions  . Aspirin Nausea Only    REACTION: stomach ulcer-can not take; "I do take baby aspirin qd"  . Penicillins Itching  . Prednisone Itching   Medication List  As of 07/09/2011  4:30 PM   TAKE these medications         albuterol 108 (90 BASE) MCG/ACT inhaler   Commonly known as: PROVENTIL HFA;VENTOLIN HFA   Inhale 2 puffs into the lungs every 6 (six) hours as needed for wheezing.      aspirin 81 MG tablet   Take 81 mg by mouth daily.      carvedilol 6.25 MG tablet   Commonly known as: COREG   Take 1 tablet (6.25 mg total) by mouth 2 (two) times daily with a meal.      co-enzyme Q-10 30 MG capsule   Take 30 mg by mouth daily.      HYDROcodone-acetaminophen 7.5-500 MG per tablet   Commonly known as: LORTAB   Take 1 tablet by mouth every 6 (six) hours as needed. For pain      Mometasone Furo-Formoterol Fum 200-5 MCG/ACT Aero   Inhale 2  puffs into the lungs 2 (two) times daily as needed.      omeprazole 40 MG capsule   Commonly known as: PRILOSEC   Take 1 capsule (40 mg total) by mouth daily.      ramipril 5 MG capsule   Commonly known as: ALTACE   Take 1 capsule (5 mg total) by mouth daily.      Red Yeast Rice 600 MG Tabs   Take 600 mg by mouth 2 (two) times daily.      rosuvastatin 5 MG tablet   Commonly known as: CRESTOR   Take 1 tablet (5 mg total) by mouth at bedtime.      spironolactone 25 MG tablet   Commonly known as: ALDACTONE   Take 1 tablet (25 mg total) by mouth daily.      venlafaxine XR 75 MG 24 hr capsule   Commonly known as: EFFEXOR-XR   Take 1 capsule  (75 mg total) by mouth 3 (three) times daily.           Follow-up Information    Follow up with [facility] (Wound check and device check)    Contact information:   [address] [place] [address]       Follow up with Sherryl Manges, MD.   Contact information:   [address] [place] [address] [phone]       Follow up with [name], MD.   Contact information:   [address] [place] [address] [phone]          BRING ALL MEDICATIONS WITH YOU TO FOLLOW UP APPOINTMENTS  Time spent with patient to include physician time: 33 min Signed: Theodore Demark 07/09/2011, 4:30 PM Co-Sign MD

## 2011-07-09 NOTE — Op Note (Signed)
NAME:  Marvin Bell, Marvin Bell NO.:  [id number]  MEDICAL RECORD NO.:  [id number]  LOCATION:  2501                         FACILITY:  [facility]  PHYSICIAN:  [facility] Salvia, MD, FACCDATE OF BIRTH:  1945/08/23  DATE OF PROCEDURE:  07/08/2011 DATE OF DISCHARGE:                              OPERATIVE REPORT   SURGEON:  [facility] Salvia, MD, [facility]  PREOPERATIVE DIAGNOSES:  Nonischemic cardiomyopathy/aortic valve replacement with left bundle-branch block and congestive heart failure.  POSTOPERATIVE DIAGNOSES:  Nonischemic cardiomyopathy/aortic valve replacement with left bundle-branch block and congestive heart failure.  PROCEDURE:  Dual-chamber defibrillator implantation with aborted left ventricular lead placement.  DESCRIPTION OF PROCEDURE:  Following obtaining informed consent, the patient was brought to the Electrophysiology Laboratory and placed on the fluoroscopic table in supine position.  After routine prep and drape of the left upper chest, lidocaine was infiltrated in prepectoral subclavicular region.  An incision was made and carried down to the level of the prepectoral fascia using electrocautery and sharp dissection, a pocket was formed similarly.  Hemostasis was obtained.  Thereafter, attention was turned to gain access to the extrathoracic left subclavian vein, which was accomplished without difficulty without the aspiration of air or puncture of the artery.  Three separate venipunctures were accomplished.  Guidewires were placed and retained and sequentially 9-French and 9.5-French sheaths were placed, through which, we passed a Medtronic 6947, 65-cm dual coil active fixation defibrillator lead, serial number [id number] and it was manipulated to the right ventricular apex with bipolar R-wave was 12.2 with a pacing impedance of 750, threshold of 0.3 at 0.5.  Current of threshold was 2 mA.  There was no diaphragmatic pacing at 10 V, and the current of injury was  brisk.  This lead was secured to the prepectoral fascia.  We then cannulated the coronary sinus with an MB2 coronary sinus cannulation catheter.  This was very easy.  We then took a venogram. There was a midlateral vein and I was all excited.  I then over the ensuing hour and half lost all of my excitement as I failed on numerous attempts to be able to pass a wire.  On an AP and RAO fluoroscopic image, it was clear that there was not only a shepherd's crook, but then a nearly 180-degree bend from a posterior to a more anterior direction prior to the lead coursing over the lateral wall of the heart.  On one occasion, we were able to get a wire halfway down this vein and then it prolapsed.  Numerous other vein sites were explored.  The posterior bailout vein was explored.  The only branch that were hold the lead was apical and was abandoned because the data from [facility] by the increased mortality.  The mid-branch was not able to hold the leads, although it did hold wire.  We had also gone up high and looked both on the high anterolateral wall as well as the anterior/anterior descending vein.  In both of these locations, we were able to get electrograms and capture.  However on the former occasion, the LV-RV interval was only 57 msec with a QRS duration of 200 msec and the other was 2  msec.  It was elected to abandon these sites.  After numerous attempts as noted using a variety of tools to engage the aforementioned veins, it was elected to use a 9.5-French sheath and deployed the atrial lead, which was a Medtronic 5076 52-cm lead, serial number [id number] and was manipulated to the lateral wall where bipolar P-wave 2.7 with a pacing impedance of 640, threshold of 0.8 at 0.5.  The leads were then attached to a Medtronic Delta 314TRG cardiac resynchronization device, serial number [id number].  Through the device, the bipolar P-wave was 2.8 with a pacing impedance of 494, threshold of  1.25 at 0.5.  The R-wave was 12 with a pacing impedance of 550, a threshold was 0.7 V at 0.5 msec.  Proximal coil impedance was 58 and distal coil impedance was 51.  The device was implanted.  The pocket was copiously irrigated with antibiotic-containing saline solution.  The leads and pulse generator were placed in the pocket.  I should note that the LV port had been plugged.  The device was secured to the prepectoral fascia and the wound was then closed in 3 layers in normal fashion.  The wound was washed, dried, and a benzoin, Steri-Strip dressing was applied.  Needle counts, sponge counts, and instrument counts were correct at the end of the procedure according to staff.  The patient tolerated the procedure without apparent complication.  The patient was very difficult to sedate and so, it was elected after the prolonged procedure not to try and do so and do DFT testing.  That will be deferred.  The patient will be referred for consideration of epicardial lead placement.     Duke Salvia, MD, [facility]     SCK/MEDQ  D:  07/08/2011  T:  07/09/2011  Job:  [id number]
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
## 2011-07-09 NOTE — Discharge Instructions (Signed)
    Supplemental Discharge Instructions for  Pacemaker/Defibrillator Patients  Activity No heavy lifting or vigorous activity with your left/right arm for 6 to 8 weeks.  Do not raise your left/right arm above your head for one week.  Gradually raise your affected arm as drawn below.                     April 14th                   April 15th               April 16th            April 17th       NO DRIVING for  1 week    ; you may begin driving on     April 18th     . WOUND CARE   Keep the wound area clean and dry.  Do not get this area wet for one week. No showers for one week; you may shower on      April 18th        .   The tape/steri-strips on your wound will fall off; do not pull them off.  No bandage is needed on the site.  DO  NOT apply any creams, oils, or ointments to the wound area.   If you notice any drainage or discharge from the wound, any swelling or bruising at the site, or you develop a fever > 101? F after you are discharged home, call the office at once.  Special Instructions   You are still able to use cellular telephones; use the ear opposite the side where you have your pacemaker/defibrillator.  Avoid carrying your cellular phone near your device.   When traveling through airports, show security personnel your identification card to avoid being screened in the metal detectors.  Ask the security personnel to use the hand wand.   Avoid arc welding equipment, MRI testing (magnetic resonance imaging), TENS units (transcutaneous nerve stimulators).  Call the office for questions about other devices.   Avoid electrical appliances that are in poor condition or are not properly grounded.   Microwave ovens are safe to be near or to operate.  Additional information for defibrillator patients should your device go off:   If your device goes off ONCE and you feel fine afterward, notify the device clinic nurses.   If your device goes off ONCE and you do not feel well afterward, call  911.   If your device goes off TWICE, call 911.   If your device goes off THREE times in one day, call 911.  DO NOT DRIVE YOURSELF OR A FAMILY MEMBER WITH A DEFIBRILLATOR TO THE HOSPITAL--CALL 911.

## 2011-07-09 NOTE — Plan of Care (Signed)
Problem: Consults Goal: ICD Patient Education (See Patient Education module for education specifics.)  Outcome: Completed/Met Date Met:  07/09/11 Pt watched videos for arrhythmia, AICD, CHF.  Off the beat book and CHF education packet reviewed.  Questions answered.

## 2011-07-09 NOTE — Consult Note (Signed)
Pt smokes 2-3 cigarettes per day and says "this is the hardest thing I've ever had to do", referring to quitting. He smokes his first cigarette in less than 30 minutes after waking up in the morning. Pt is willing to try 4 mg commit lozenges to help him quit. He's never tried NRT's before.  Discussed lozenge use instructions and side effects. Referred to 1-800 quit now for f/u and support. Discussed oral fixation substitutes, second hand smoke and in home smoking policy. Reviewed and gave pt Written education/contact information.

## 2011-07-13 ENCOUNTER — Ambulatory Visit (INDEPENDENT_AMBULATORY_CARE_PROVIDER_SITE_OTHER): Payer: Medicare Other | Admitting: Family Medicine

## 2011-07-13 ENCOUNTER — Encounter: Payer: Self-pay | Admitting: Family Medicine

## 2011-07-13 VITALS — BP 105/72 | HR 93 | Temp 97.6°F | Wt 159.0 lb

## 2011-07-13 DIAGNOSIS — M7580 Other shoulder lesions, unspecified shoulder: Secondary | ICD-10-CM

## 2011-07-13 DIAGNOSIS — M67919 Unspecified disorder of synovium and tendon, unspecified shoulder: Secondary | ICD-10-CM

## 2011-07-13 DIAGNOSIS — M719 Bursopathy, unspecified: Secondary | ICD-10-CM

## 2011-07-13 MED ORDER — METHYLPREDNISOLONE ACETATE 40 MG/ML IJ SUSP
40.0000 mg | Freq: Once | INTRAMUSCULAR | Status: AC
  Administered 2011-07-13: 40 mg via INTRAMUSCULAR

## 2011-07-13 NOTE — Assessment & Plan Note (Signed)
Right shoulder: flared again.  It has been about 3 mo since last steroid injection so we'll proceed with another today.  Procedure: Therapeutic shoulder injection.  The patient's clinical condition is marked by substantial pain and/or significant functional disability.  Other conservative therapy has not provided relief, is contraindicated, or not appropriate.  There is a reasonable likelihood that injection will significantly improve the patient's pain and/or functional disability. Cleaned skin with alcohol swab, used posterolateral approach, Injected 1 ml of 40mg /ml methylprednisolone + 2ml of 2% lidocaine w/out epi into subacromial space without resistance.  No immediate complications.  Patient tolerated procedure well.  Post-injection care discussed, including 20 min of icing 1-2 times in the next 4-8 hours, frequent non weight-bearing ROM exercises over the next few days, and general pain medication management.
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
## 2011-07-21 ENCOUNTER — Telehealth: Payer: Self-pay | Admitting: Internal Medicine

## 2011-07-21 NOTE — Telephone Encounter (Signed)
I spoke with the patient's wife. She states that the ICD implant site looks a little swollen. He states it is sore to touch. There is no drainage of bleeding noted. He does have a small area of redness noted at the top of the site, but it is not warm to touch. The patient is afebrile. The patient has told his wife that he thinks he is rolling on that side during his sleep. I explained as long as he is afebrile and the site is not hot or draining, then he should be ok to wait until his wound check on 07/23/11 and we can look at this.

## 2011-07-21 NOTE — Telephone Encounter (Signed)
New msg Pt's wife called and said site of where pacemaker was put in is swollen. Please call

## 2011-07-23 ENCOUNTER — Ambulatory Visit (INDEPENDENT_AMBULATORY_CARE_PROVIDER_SITE_OTHER): Payer: Medicare Other | Admitting: *Deleted

## 2011-07-23 ENCOUNTER — Encounter: Payer: Self-pay | Admitting: Internal Medicine

## 2011-07-23 DIAGNOSIS — I428 Other cardiomyopathies: Secondary | ICD-10-CM

## 2011-07-23 LAB — ICD DEVICE OBSERVATION
AL AMPLITUDE: 2.25 mv
AL IMPEDENCE ICD: 399 Ohm
AL THRESHOLD: 0.75 V
BATTERY VOLTAGE: 3.2 V
LV LEAD IMPEDENCE ICD: 4047 Ohm
PACEART VT: 0
RV LEAD AMPLITUDE: 14.875 mv
TOT-0002: 0
TOT-0006: 20130410000000
TZAT-0001ATACH: 1
TZAT-0001ATACH: 3
TZAT-0001SLOWVT: 1
TZAT-0002ATACH: NEGATIVE
TZAT-0002FASTVT: NEGATIVE
TZAT-0004SLOWVT: 8
TZAT-0005SLOWVT: 88 pct
TZAT-0012ATACH: 150 ms
TZAT-0012ATACH: 150 ms
TZAT-0012ATACH: 150 ms
TZAT-0012FASTVT: 170 ms
TZAT-0018ATACH: NEGATIVE
TZAT-0018FASTVT: NEGATIVE
TZAT-0018SLOWVT: NEGATIVE
TZAT-0019ATACH: 6 V
TZAT-0019FASTVT: 8 V
TZAT-0020ATACH: 1.5 ms
TZAT-0020ATACH: 1.5 ms
TZAT-0020SLOWVT: 1.5 ms
TZON-0003ATACH: 350 ms
TZON-0003VSLOWVT: 450 ms
TZST-0001ATACH: 4
TZST-0001ATACH: 6
TZST-0001FASTVT: 5
TZST-0001FASTVT: 6
TZST-0001SLOWVT: 2
TZST-0001SLOWVT: 4
TZST-0001SLOWVT: 5
TZST-0001SLOWVT: 6
TZST-0002ATACH: NEGATIVE
TZST-0002ATACH: NEGATIVE
TZST-0002FASTVT: NEGATIVE
TZST-0002FASTVT: NEGATIVE
TZST-0003SLOWVT: 35 J
TZST-0003SLOWVT: 35 J

## 2011-07-23 NOTE — Progress Notes (Signed)
Wound check-ICD 

## 2011-07-24 ENCOUNTER — Telehealth: Payer: Self-pay | Admitting: Cardiology

## 2011-07-24 ENCOUNTER — Telehealth: Payer: Self-pay

## 2011-07-24 NOTE — Telephone Encounter (Signed)
Walk in pt form " pt needs Prescription Filled" sent to sent to Msg Nurse  07/24/11/Km

## 2011-07-24 NOTE — Telephone Encounter (Signed)
Pt states that he is taking Zolpidem and Trazodone but the last couple of weeks he can't sleep? Pt would like to know if he should take more of a medication or should he switch something? Pt states that he understands Dr Milinda Cave is out of the office and is okay waiting to see what Dr Milinda Cave says about this. Pt states he only wants to do what Dr Milinda Cave wants him to do. Please advise?

## 2011-07-27 ENCOUNTER — Other Ambulatory Visit: Payer: Self-pay | Admitting: *Deleted

## 2011-07-27 MED ORDER — RAMIPRIL 5 MG PO CAPS: 5.0000 mg | ORAL_CAPSULE | Freq: Every day | ORAL | Status: DC

## 2011-07-29 NOTE — Telephone Encounter (Signed)
Patient informed and states understanding of doctors orders

## 2011-07-29 NOTE — Telephone Encounter (Signed)
Tell him to increase his trazodone at bedtime.  He can take up to 250 mg of this med at one time, but he should work his way up to this max dosing slowly.  According to his chart he only takes one of the 50mg  tabs at bedtime right now, so have him increase by one 50mg  tab every 4-5 days until he is either sleeping better or reaches the maximum dosing of 250mg  (5 tabs at once).  Remind him that this med is NOT addictive or habit forming. He may also continue [place] like he's currently doing.--thx

## 2011-08-07 ENCOUNTER — Other Ambulatory Visit: Payer: Self-pay | Admitting: *Deleted

## 2011-08-07 ENCOUNTER — Ambulatory Visit: Payer: Medicare Other | Admitting: Cardiology

## 2011-08-07 MED ORDER — HYDROCODONE-ACETAMINOPHEN 7.5-500 MG PO TABS: 1.0000 | ORAL_TABLET | Freq: Four times a day (QID) | ORAL | Status: DC | PRN

## 2011-08-07 NOTE — Telephone Encounter (Signed)
Faxed

## 2011-08-07 NOTE — Telephone Encounter (Signed)
Faxed refill request received from pharmacy for Hydrocodone 7.5/500 Last filled by MD on 05/06/11, #60 x 1 Last seen on 07/13/11 Follow up 09/2011 Please advise refills.

## 2011-08-13 ENCOUNTER — Telehealth: Payer: Self-pay | Admitting: Cardiology

## 2011-08-13 NOTE — Telephone Encounter (Signed)
Spoke with pt. Pt not having any problems-he is calling to reschedule an appt he cancelled with Dr Shirlee Latch in May.  I have given pt an appt with Dr Shirlee Latch 09/16/11.  I offered pt sooner appt with PA-he declined.

## 2011-08-13 NOTE — Telephone Encounter (Signed)
Pt calling to rs cxl appt, asked for next available which was 6-28, he said that was ridiculous, i told he since he asked for next available that's what i gave him, i can put him in sooner with the pa or have the nurse call him to advise of what to do, only would say he has to have a wire attached to his heart because it couldn't be done during the first surgery, not sure why he needs an appt, pls call

## 2011-08-21 ENCOUNTER — Ambulatory Visit (INDEPENDENT_AMBULATORY_CARE_PROVIDER_SITE_OTHER): Payer: Medicare Other | Admitting: Family Medicine

## 2011-08-21 ENCOUNTER — Encounter: Payer: Self-pay | Admitting: Family Medicine

## 2011-08-21 VITALS — BP 100/65 | HR 78 | Ht 67.0 in | Wt 159.0 lb

## 2011-08-21 DIAGNOSIS — M545 Low back pain: Secondary | ICD-10-CM

## 2011-08-21 DIAGNOSIS — M25519 Pain in unspecified shoulder: Secondary | ICD-10-CM

## 2011-08-21 DIAGNOSIS — G8929 Other chronic pain: Secondary | ICD-10-CM

## 2011-08-21 DIAGNOSIS — M25511 Pain in right shoulder: Secondary | ICD-10-CM

## 2011-08-21 DIAGNOSIS — M25559 Pain in unspecified hip: Secondary | ICD-10-CM

## 2011-08-21 MED ORDER — HYDROCODONE-ACETAMINOPHEN 7.5-500 MG PO TABS: ORAL_TABLET | ORAL | Status: DC
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
## 2011-09-07 ENCOUNTER — Ambulatory Visit (HOSPITAL_BASED_OUTPATIENT_CLINIC_OR_DEPARTMENT_OTHER)
Admission: RE | Admit: 2011-09-07 | Discharge: 2011-09-07 | Disposition: A | Discharge status: Home / Self Care | Payer: Medicare Other | Source: Ambulatory Visit | Attending: Family Medicine | Admitting: Family Medicine

## 2011-09-07 ENCOUNTER — Encounter: Payer: Self-pay | Admitting: Family Medicine

## 2011-09-07 DIAGNOSIS — M545 Low back pain, unspecified: Secondary | ICD-10-CM | POA: Insufficient documentation

## 2011-09-07 DIAGNOSIS — M25512 Pain in left shoulder: Secondary | ICD-10-CM

## 2011-09-07 DIAGNOSIS — M25519 Pain in unspecified shoulder: Secondary | ICD-10-CM | POA: Insufficient documentation

## 2011-09-07 DIAGNOSIS — G8929 Other chronic pain: Secondary | ICD-10-CM

## 2011-09-07 DIAGNOSIS — M25559 Pain in unspecified hip: Secondary | ICD-10-CM | POA: Insufficient documentation

## 2011-09-11 ENCOUNTER — Other Ambulatory Visit: Payer: Self-pay | Admitting: Family Medicine

## 2011-09-11 NOTE — Telephone Encounter (Signed)
eScribe request for refill on TRAZODONE  Last seen on 08/21/11 Follow up 09/15/11 Please advise refills.

## 2011-09-15 ENCOUNTER — Ambulatory Visit (INDEPENDENT_AMBULATORY_CARE_PROVIDER_SITE_OTHER): Payer: Medicare Other | Admitting: Family Medicine

## 2011-09-15 ENCOUNTER — Encounter: Payer: Self-pay | Admitting: Family Medicine

## 2011-09-15 VITALS — BP 96/64 | HR 73 | Ht 67.0 in | Wt 157.0 lb

## 2011-09-15 DIAGNOSIS — M1990 Unspecified osteoarthritis, unspecified site: Secondary | ICD-10-CM

## 2011-09-15 DIAGNOSIS — E785 Hyperlipidemia, unspecified: Secondary | ICD-10-CM

## 2011-09-15 DIAGNOSIS — M19019 Primary osteoarthritis, unspecified shoulder: Secondary | ICD-10-CM

## 2011-09-15 DIAGNOSIS — J449 Chronic obstructive pulmonary disease, unspecified: Secondary | ICD-10-CM
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
## 2011-09-16 ENCOUNTER — Ambulatory Visit (INDEPENDENT_AMBULATORY_CARE_PROVIDER_SITE_OTHER): Payer: Medicare Other | Admitting: Cardiology

## 2011-09-16 ENCOUNTER — Encounter: Payer: Self-pay | Admitting: Cardiology

## 2011-09-16 VITALS — BP 100/64 | HR 70 | Ht 66.0 in | Wt 156.0 lb

## 2011-09-16 DIAGNOSIS — E785 Hyperlipidemia, unspecified: Secondary | ICD-10-CM

## 2011-09-16 DIAGNOSIS — I2510 Atherosclerotic heart disease of native coronary artery without angina pectoris: Secondary | ICD-10-CM

## 2011-09-16 DIAGNOSIS — M1990 Unspecified osteoarthritis, unspecified site: Secondary | ICD-10-CM | POA: Insufficient documentation

## 2011-09-16 DIAGNOSIS — I359 Nonrheumatic aortic valve disorder, unspecified: Secondary | ICD-10-CM

## 2011-09-16 DIAGNOSIS — R0609 Other forms of dyspnea: Secondary | ICD-10-CM

## 2011-09-16 DIAGNOSIS — I5022 Chronic systolic (congestive) heart failure: Secondary | ICD-10-CM

## 2011-09-16 DIAGNOSIS — I714 Abdominal aortic aneurysm, without rupture: Secondary | ICD-10-CM

## 2011-09-16 LAB — BASIC METABOLIC PANEL
BUN: 11 mg/dL (ref 6–23)
CO2: 26 mEq/L (ref 19–32)
Calcium: 9.2 mg/dL (ref 8.4–10.5)
Glucose, Bld: 76 mg/dL (ref 70–99)
Sodium: 138 mEq/L (ref 135–145)

## 2011-09-16 LAB — LIPID PANEL
Cholesterol: 130 mg/dL (ref 0–200)
HDL: 40 mg/dL (ref >39.00)

## 2011-09-16 MED ORDER — DIGOXIN 125 MCG PO TABS: 0.1250 mg | ORAL_TABLET | Freq: Every day | ORAL | Status: DC

## 2011-09-16 NOTE — Assessment & Plan Note (Signed)
EF 20-25%, predominantly nonischemic cardiomyopathy.   He does not appear volume overloaded on exam.  His symptoms may be more of a low output picture (poor appetite, fatigue, etc).   - Continue current Coreg, ramipril, and spironolactone.  Need BMET today.  - Start digoxin 0.125 mg daily given concern for low output.   - Followup in office in 1 month with digoxin level.  - Would like to explore possibility for epicardial lead placement by [facility].  Will discuss with Dr. Graciela Husbands.

## 2011-09-16 NOTE — Patient Instructions (Addendum)
Start Digoxin(lanoxin) 0.125mg  daily.  Your physician recommends that you return for a FASTING lipid profile /BMET/BNP today.  Your physician recommends that you schedule a follow-up appointment in: 1 month with Dr Shirlee Latch.   Your physician recommends that you return for lab work in: 1 month when you see Dr Lucile Crater level.

## 2011-09-16 NOTE — Assessment & Plan Note (Signed)
Bioprosthetic aortic valve appeared well-seated by the last echo. 

## 2011-09-16 NOTE — Assessment & Plan Note (Signed)
Bilateral AC joint disease has given him the most issues lately, with low back pain being more of a chronic and stable entity. He has improved pain control on higher dose pain med, 7.5/500 vicodin, and he still has about 1/2 of his bottle of #75 that I rx'd 3 wks ago. Continue this tx, plus he is agreeable to a trial of PT so we'll set this up ASAP (prefers [facility] b/c he had a good experience with cardiac rehab there).
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
## 2011-09-16 NOTE — Assessment & Plan Note (Signed)
Per his report today he sounds like he is at his baseline, plus he continues to smoke. Financial constraints and his desire to avoid more meds has prevented [place] pursuing this too strongly (such as trial of symbicort or spireva or daliresp).  For now he'll continue simply using prn albuterol and I encouraged him to totally quit smoking.

## 2011-09-17 NOTE — Assessment & Plan Note (Signed)
Check lipids today, goal LDL < 70 given coronary disease.

## 2011-09-17 NOTE — Assessment & Plan Note (Signed)
Repeat abdominal US in 3/14.  

## 2011-09-18 ENCOUNTER — Telehealth: Payer: Self-pay | Admitting: *Deleted

## 2011-09-18 DIAGNOSIS — I5022 Chronic systolic (congestive) heart failure: Secondary | ICD-10-CM

## 2011-09-18 NOTE — Telephone Encounter (Signed)
Spoke with pt. Pt agreed to referral to Dr Laneta Simmers for epicardial LV lead.

## 2011-09-18 NOTE — Telephone Encounter (Signed)
Marvin Bell, please refer Mr Marvin Bell to Dr. Laneta Simmers at [facility] for epicardial LV lead.  Marvin Morale, MD

## 2011-09-23 ENCOUNTER — Encounter: Payer: Self-pay | Admitting: Surgery

## 2011-10-07 ENCOUNTER — Encounter: Payer: Self-pay | Admitting: *Deleted

## 2011-10-07 DIAGNOSIS — Z95810 Presence of automatic (implantable) cardiac defibrillator: Secondary | ICD-10-CM | POA: Insufficient documentation

## 2011-10-12 DIAGNOSIS — I429 Cardiomyopathy, unspecified: Secondary | ICD-10-CM | POA: Insufficient documentation

## 2011-10-12 DIAGNOSIS — I350 Nonrheumatic aortic (valve) stenosis: Secondary | ICD-10-CM | POA: Insufficient documentation

## 2011-10-13 ENCOUNTER — Encounter: Payer: Self-pay | Admitting: Internal Medicine

## 2011-10-13 ENCOUNTER — Encounter: Payer: Medicare Other | Admitting: Internal Medicine

## 2011-10-13 ENCOUNTER — Ambulatory Visit (INDEPENDENT_AMBULATORY_CARE_PROVIDER_SITE_OTHER): Payer: Medicare Other | Admitting: Internal Medicine

## 2011-10-13 ENCOUNTER — Encounter: Payer: Self-pay | Admitting: Surgery

## 2011-10-13 ENCOUNTER — Institutional Professional Consult (permissible substitution) (INDEPENDENT_AMBULATORY_CARE_PROVIDER_SITE_OTHER): Payer: Medicare Other | Admitting: Surgery

## 2011-10-13 ENCOUNTER — Other Ambulatory Visit: Payer: Self-pay | Admitting: *Deleted

## 2011-10-13 VITALS — BP 108/60 | HR 90 | Resp 16 | Ht 66.0 in | Wt 157.0 lb

## 2011-10-13 VITALS — BP 98/65 | HR 87 | Ht 65.0 in | Wt 154.0 lb

## 2011-10-13 DIAGNOSIS — I350 Nonrheumatic aortic (valve) stenosis: Secondary | ICD-10-CM

## 2011-10-13 DIAGNOSIS — I5022 Chronic systolic (congestive) heart failure: Secondary | ICD-10-CM

## 2011-10-13 DIAGNOSIS — I428 Other cardiomyopathies: Secondary | ICD-10-CM

## 2011-10-13 DIAGNOSIS — I429 Cardiomyopathy, unspecified: Secondary | ICD-10-CM

## 2011-10-13 DIAGNOSIS — Z95810 Presence of automatic (implantable) cardiac defibrillator: Secondary | ICD-10-CM

## 2011-10-13 DIAGNOSIS — I359 Nonrheumatic aortic valve disorder, unspecified: Secondary | ICD-10-CM

## 2011-10-13 LAB — ICD DEVICE OBSERVATION
AL IMPEDENCE ICD: 551 Ohm
AL THRESHOLD: 1 V
BAMS-0001: 170 {beats}/min
BATTERY VOLTAGE: 3.2037 V
PACEART VT: 0
RV LEAD THRESHOLD: 0.625 V
TZAT-0001ATACH: 2
TZAT-0001ATACH: 3
TZAT-0002ATACH: NEGATIVE
TZAT-0005SLOWVT: 88 pct
TZAT-0011SLOWVT: 10 ms
TZAT-0012ATACH: 150 ms
TZAT-0012ATACH: 150 ms
TZAT-0018ATACH: NEGATIVE
TZAT-0018ATACH: NEGATIVE
TZAT-0018SLOWVT: NEGATIVE
TZAT-0019FASTVT: 8 V
TZAT-0020ATACH: 1.5 ms
TZAT-0020FASTVT: 1.5 ms
TZON-0003VSLOWVT: 450 ms
TZON-0004SLOWVT: 28
TZON-0004VSLOWVT: 32
TZON-0005SLOWVT: 12
TZST-0001ATACH: 6
TZST-0001FASTVT: 2
TZST-0001FASTVT: 3
TZST-0001FASTVT: 5
TZST-0001SLOWVT: 3
TZST-0001SLOWVT: 5
TZST-0002ATACH: NEGATIVE
TZST-0002ATACH: NEGATIVE
TZST-0002ATACH: NEGATIVE
TZST-0002FASTVT: NEGATIVE
TZST-0002FASTVT: NEGATIVE
TZST-0003SLOWVT: 35 J
VENTRICULAR PACING ICD: 0.19 pct
VF: 0

## 2011-10-13 NOTE — Assessment & Plan Note (Signed)
The patient's device was interrogated and the information was fully reviewed.  The device was reprogrammed to  Maximize longevity 

## 2011-10-13 NOTE — Assessment & Plan Note (Signed)
He will be submitted for epicardial lead placed
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
## 2011-10-16 ENCOUNTER — Encounter (HOSPITAL_COMMUNITY): Payer: Self-pay | Admitting: Pharmacy Technician

## 2011-10-17 NOTE — Pre-Procedure Instructions (Signed)
[address] [place]  10/17/2011   Your procedure is scheduled on:  Wed, July 24 @ 8:30 AM  Report to [facility] at 6:30 AM.  Call this number if you have problems the morning of surgery: [phone]   Remember:   Do not eat food:After Midnight.    Take these medicines the morning of surgery with A SIP OF WATER: Albuterol<Bring Your Inhaler With You>,Carvedilol(Coreg),Diazepam(Valium),Pain Pill(if needed),Effexor(Venlafaxine),and  Omeprazole(Prilosec)   Do not wear jewelry  Do not wear lotions, powders, or colognes  Men may shave face and neck.  Do not bring valuables to the hospital.  Contacts, dentures or bridgework may not be worn into surgery.  Leave suitcase in the car. After surgery it may be brought to your room.  For patients admitted to the hospital, checkout time is 11:00 AM the day of discharge.   Patients discharged the day of surgery will not be allowed to drive home.  Special Instructions: Incentive Spirometry - Practice and bring it with you on the day of surgery. and CHG Shower Use Special Wash: 1/2 bottle night before surgery and 1/2 bottle morning of surgery.   Please read over the following fact sheets that you were given: Pain Booklet, Coughing and Deep Breathing, Blood Transfusion Information, MRSA Information and Surgical Site Infection Prevention

## 2011-10-19 ENCOUNTER — Inpatient Hospital Stay (HOSPITAL_COMMUNITY): Admission: RE | Admit: 2011-10-19 | Discharge: 2011-10-19 | Payer: Medicare Other | Source: Ambulatory Visit

## 2011-10-20 ENCOUNTER — Encounter (HOSPITAL_COMMUNITY)
Admission: RE | Admit: 2011-10-20 | Discharge: 2011-10-20 | Disposition: A | Discharge status: Home / Self Care | Payer: Medicare Other | Source: Ambulatory Visit | Attending: Surgery | Admitting: Surgery

## 2011-10-20 ENCOUNTER — Encounter (HOSPITAL_COMMUNITY): Payer: Self-pay

## 2011-10-20 VITALS — BP 111/69 | HR 79 | Temp 97.9°F | Resp 20 | Ht 67.0 in | Wt 158.3 lb

## 2011-10-20 DIAGNOSIS — I5022 Chronic systolic (congestive) heart failure: Secondary | ICD-10-CM

## 2011-10-20 LAB — CBC
MCV: 91.3 fL (ref 78.0–100.0)
Platelets: 190 10*3/uL (ref 150–400)
RBC: 4.47 MIL/uL (ref 4.22–5.81)
WBC: 13 10*3/uL — ABNORMAL HIGH (ref 4.0–10.5)

## 2011-10-20 LAB — URINALYSIS, ROUTINE W REFLEX MICROSCOPIC
Glucose, UA: NEGATIVE mg/dL
Leukocytes, UA: NEGATIVE
Nitrite: NEGATIVE
Protein, ur: NEGATIVE mg/dL
pH: 5.5 (ref 5.0–8.0)

## 2011-10-20 LAB — ABO/RH: ABO/RH(D): O POS

## 2011-10-20 LAB — APTT: aPTT: 31 seconds (ref 24–37)

## 2011-10-20 LAB — COMPREHENSIVE METABOLIC PANEL
ALT: 10 U/L (ref 0–53)
AST: 13 U/L (ref 0–37)
CO2: 22 mEq/L (ref 19–32)
Chloride: 102 mEq/L (ref 96–112)
GFR calc non Af Amer: 53 mL/min — ABNORMAL LOW (ref >90)
Potassium: 4.4 mEq/L (ref 3.5–5.1)
Sodium: 136 mEq/L (ref 135–145)
Total Bilirubin: 0.3 mg/dL (ref 0.3–1.2)

## 2011-10-20 LAB — BLOOD GAS, ARTERIAL
Acid-base deficit: 0.1 mmol/L (ref 0.0–2.0)
Drawn by: 206361
pCO2 arterial: 39.7 mmHg (ref 35.0–45.0)
pO2, Arterial: 81.4 mmHg (ref 80.0–100.0)

## 2011-10-20 LAB — TYPE AND SCREEN

## 2011-10-20 MED ORDER — VANCOMYCIN HCL IN DEXTROSE 1-5 GM/200ML-% IV SOLN
1000.0000 mg | INTRAVENOUS | Status: AC
  Administered 2011-10-21: 1000 mg via INTRAVENOUS
  Filled 2011-10-20 (×2): qty 200

## 2011-10-20 NOTE — Progress Notes (Signed)
Paged Medtr[employer] rep.

## 2011-10-21 ENCOUNTER — Encounter (HOSPITAL_COMMUNITY): Payer: Self-pay | Admitting: Surgery

## 2011-10-21 ENCOUNTER — Encounter (HOSPITAL_COMMUNITY): Payer: Self-pay | Admitting: Anesthesiology

## 2011-10-21 ENCOUNTER — Inpatient Hospital Stay (HOSPITAL_COMMUNITY): Payer: Medicare Other

## 2011-10-21 ENCOUNTER — Encounter (HOSPITAL_COMMUNITY)
Admission: RE | Disposition: A | Discharge status: Home / Self Care | Payer: Self-pay | Source: Ambulatory Visit | Attending: Surgery

## 2011-10-21 ENCOUNTER — Inpatient Hospital Stay (HOSPITAL_COMMUNITY): Payer: Medicare Other | Admitting: Anesthesiology

## 2011-10-21 ENCOUNTER — Inpatient Hospital Stay (HOSPITAL_COMMUNITY)
Admission: RE | Admit: 2011-10-21 | Discharge: 2011-10-28 | DRG: 261 | Disposition: A | Discharge status: Home / Self Care | Payer: Medicare Other | Source: Ambulatory Visit | Attending: Surgery | Admitting: Surgery

## 2011-10-21 DIAGNOSIS — M51379 Other intervertebral disc degeneration, lumbosacral region without mention of lumbar back pain or lower extremity pain: Secondary | ICD-10-CM | POA: Diagnosis present

## 2011-10-21 DIAGNOSIS — I428 Other cardiomyopathies: Principal | ICD-10-CM | POA: Diagnosis present

## 2011-10-21 DIAGNOSIS — I509 Heart failure, unspecified: Secondary | ICD-10-CM | POA: Diagnosis present

## 2011-10-21 DIAGNOSIS — I714 Abdominal aortic aneurysm, without rupture, unspecified: Secondary | ICD-10-CM | POA: Diagnosis present

## 2011-10-21 DIAGNOSIS — Z7982 Long term (current) use of aspirin: Secondary | ICD-10-CM

## 2011-10-21 DIAGNOSIS — F3289 Other specified depressive episodes: Secondary | ICD-10-CM | POA: Diagnosis present

## 2011-10-21 DIAGNOSIS — I059 Rheumatic mitral valve disease, unspecified: Secondary | ICD-10-CM | POA: Diagnosis present

## 2011-10-21 DIAGNOSIS — M5137 Other intervertebral disc degeneration, lumbosacral region: Secondary | ICD-10-CM | POA: Diagnosis present

## 2011-10-21 DIAGNOSIS — E876 Hypokalemia: Secondary | ICD-10-CM | POA: Diagnosis not present

## 2011-10-21 DIAGNOSIS — I359 Nonrheumatic aortic valve disorder, unspecified: Secondary | ICD-10-CM

## 2011-10-21 DIAGNOSIS — F329 Major depressive disorder, single episode, unspecified: Secondary | ICD-10-CM | POA: Diagnosis present

## 2011-10-21 DIAGNOSIS — Z79899 Other long term (current) drug therapy: Secondary | ICD-10-CM

## 2011-10-21 DIAGNOSIS — I2582 Chronic total occlusion of coronary artery: Secondary | ICD-10-CM | POA: Diagnosis present

## 2011-10-21 DIAGNOSIS — G8929 Other chronic pain: Secondary | ICD-10-CM | POA: Diagnosis present

## 2011-10-21 DIAGNOSIS — Z87891 Personal history of nicotine dependence: Secondary | ICD-10-CM

## 2011-10-21 DIAGNOSIS — K449 Diaphragmatic hernia without obstruction or gangrene: Secondary | ICD-10-CM | POA: Diagnosis present

## 2011-10-21 DIAGNOSIS — I447 Left bundle-branch block, unspecified: Secondary | ICD-10-CM | POA: Diagnosis present

## 2011-10-21 DIAGNOSIS — K219 Gastro-esophageal reflux disease without esophagitis: Secondary | ICD-10-CM | POA: Diagnosis present

## 2011-10-21 DIAGNOSIS — J4489 Other specified chronic obstructive pulmonary disease: Secondary | ICD-10-CM | POA: Diagnosis present

## 2011-10-21 DIAGNOSIS — I5022 Chronic systolic (congestive) heart failure: Secondary | ICD-10-CM | POA: Diagnosis present

## 2011-10-21 DIAGNOSIS — Z952 Presence of prosthetic heart valve: Secondary | ICD-10-CM

## 2011-10-21 DIAGNOSIS — F05 Delirium due to known physiological condition: Secondary | ICD-10-CM | POA: Diagnosis not present

## 2011-10-21 DIAGNOSIS — F4310 Post-traumatic stress disorder, unspecified: Secondary | ICD-10-CM | POA: Diagnosis present

## 2011-10-21 DIAGNOSIS — J449 Chronic obstructive pulmonary disease, unspecified: Secondary | ICD-10-CM | POA: Diagnosis present

## 2011-10-21 DIAGNOSIS — R0902 Hypoxemia: Secondary | ICD-10-CM | POA: Diagnosis not present

## 2011-10-21 DIAGNOSIS — I2510 Atherosclerotic heart disease of native coronary artery without angina pectoris: Secondary | ICD-10-CM | POA: Diagnosis present

## 2011-10-21 DIAGNOSIS — I252 Old myocardial infarction: Secondary | ICD-10-CM

## 2011-10-21 DIAGNOSIS — F17200 Nicotine dependence, unspecified, uncomplicated: Secondary | ICD-10-CM

## 2011-10-21 HISTORY — PX: THORACOTOMY: SHX5074

## 2011-10-21 LAB — GLUCOSE, CAPILLARY: Glucose-Capillary: 115 mg/dL — ABNORMAL HIGH (ref 70–99)

## 2011-10-21 SURGERY — THORACOTOMY, MAJOR
Anesthesia: General | Site: Chest | Wound class: Clean

## 2011-10-21 MED ORDER — ONDANSETRON HCL 4 MG/2ML IJ SOLN: 4.0000 mg | Freq: Four times a day (QID) | INTRAMUSCULAR | Status: DC | PRN

## 2011-10-21 MED ORDER — PHENYLEPHRINE HCL 10 MG/ML IJ SOLN
10.0000 mg | INTRAVENOUS | Status: DC | PRN
  Administered 2011-10-21: 80 ug via INTRAVENOUS

## 2011-10-21 MED ORDER — ONDANSETRON HCL 4 MG/2ML IJ SOLN
INTRAMUSCULAR | Status: DC | PRN
  Administered 2011-10-21: 4 mg via INTRAVENOUS

## 2011-10-21 MED ORDER — BISACODYL 5 MG PO TBEC
10.0000 mg | DELAYED_RELEASE_TABLET | Freq: Every day | ORAL | Status: DC
  Administered 2011-10-22 – 2011-10-28 (×4): 10 mg via ORAL
  Filled 2011-10-21 (×4): qty 2

## 2011-10-21 MED ORDER — ASPIRIN EC 81 MG PO TBEC
81.0000 mg | DELAYED_RELEASE_TABLET | Freq: Every day | ORAL | Status: DC
  Administered 2011-10-22 – 2011-10-28 (×7): 81 mg via ORAL
  Filled 2011-10-21 (×8): qty 1

## 2011-10-21 MED ORDER — LACTATED RINGERS IV SOLN
INTRAVENOUS | Status: DC | PRN
  Administered 2011-10-21: 08:00:00 via INTRAVENOUS

## 2011-10-21 MED ORDER — 0.9 % SODIUM CHLORIDE (POUR BTL) OPTIME
TOPICAL | Status: DC | PRN
  Administered 2011-10-21: 1000 mL

## 2011-10-21 MED ORDER — SENNOSIDES-DOCUSATE SODIUM 8.6-50 MG PO TABS
1.0000 | ORAL_TABLET | Freq: Every evening | ORAL | Status: DC | PRN
  Filled 2011-10-21: qty 1

## 2011-10-21 MED ORDER — SUFENTANIL CITRATE 50 MCG/ML IV SOLN
INTRAVENOUS | Status: DC | PRN
  Administered 2011-10-21: 20 ug via INTRAVENOUS
  Administered 2011-10-21 (×2): 5 ug via INTRAVENOUS
  Administered 2011-10-21 (×2): 10 ug via INTRAVENOUS

## 2011-10-21 MED ORDER — POTASSIUM CHLORIDE 10 MEQ/50ML IV SOLN
10.0000 meq | Freq: Every day | INTRAVENOUS | Status: DC | PRN
  Filled 2011-10-21: qty 50

## 2011-10-21 MED ORDER — INSULIN ASPART 100 UNIT/ML ~~LOC~~ SOLN: 0.0000 [IU] | SUBCUTANEOUS | Status: DC

## 2011-10-21 MED ORDER — OXYCODONE HCL 5 MG PO TABS
5.0000 mg | ORAL_TABLET | ORAL | Status: AC | PRN
  Administered 2011-10-21: 10 mg via ORAL

## 2011-10-21 MED ORDER — VENLAFAXINE HCL ER 75 MG PO CP24
75.0000 mg | ORAL_CAPSULE | Freq: Two times a day (BID) | ORAL | Status: DC
  Administered 2011-10-21 – 2011-10-28 (×14): 75 mg via ORAL
  Filled 2011-10-21 (×16): qty 1

## 2011-10-21 MED ORDER — HYDROMORPHONE HCL PF 1 MG/ML IJ SOLN
INTRAMUSCULAR | Status: AC
  Filled 2011-10-21: qty 1

## 2011-10-21 MED ORDER — MOXIFLOXACIN HCL IN NACL 400 MG/250ML IV SOLN
400.0000 mg | INTRAVENOUS | Status: DC
  Administered 2011-10-21: 400 mg via INTRAVENOUS
  Filled 2011-10-21: qty 250

## 2011-10-21 MED ORDER — TRAMADOL HCL 50 MG PO TABS
50.0000 mg | ORAL_TABLET | Freq: Four times a day (QID) | ORAL | Status: DC | PRN
  Administered 2011-10-25 (×2): 100 mg via ORAL
  Administered 2011-10-26 – 2011-10-27 (×2): 50 mg via ORAL
  Filled 2011-10-21: qty 2
  Filled 2011-10-21 (×2): qty 1
  Filled 2011-10-21: qty 2
  Filled 2011-10-21: qty 1

## 2011-10-21 MED ORDER — OXYCODONE-ACETAMINOPHEN 5-325 MG PO TABS
1.0000 | ORAL_TABLET | ORAL | Status: DC | PRN
  Administered 2011-10-22 (×2): 1 via ORAL
  Filled 2011-10-21: qty 2
  Filled 2011-10-21: qty 1

## 2011-10-21 MED ORDER — GLYCOPYRROLATE 0.2 MG/ML IJ SOLN
INTRAMUSCULAR | Status: DC | PRN
  Administered 2011-10-21: .6 mg via INTRAVENOUS

## 2011-10-21 MED ORDER — MORPHINE SULFATE 2 MG/ML IJ SOLN
2.0000 mg | INTRAMUSCULAR | Status: DC | PRN
  Administered 2011-10-21 – 2011-10-22 (×4): 2 mg via INTRAVENOUS
  Filled 2011-10-21 (×4): qty 1

## 2011-10-21 MED ORDER — PHENYLEPHRINE HCL 10 MG/ML IJ SOLN
INTRAMUSCULAR | Status: DC | PRN
  Administered 2011-10-21 (×5): 40 ug via INTRAVENOUS

## 2011-10-21 MED ORDER — HYDROMORPHONE HCL PF 1 MG/ML IJ SOLN
0.2500 mg | INTRAMUSCULAR | Status: DC | PRN
  Administered 2011-10-21 (×4): 0.5 mg via INTRAVENOUS

## 2011-10-21 MED ORDER — ETOMIDATE 2 MG/ML IV SOLN
INTRAVENOUS | Status: DC | PRN
  Administered 2011-10-21: 20 mg via INTRAVENOUS

## 2011-10-21 MED ORDER — LIDOCAINE HCL (PF) 1 % IJ SOLN
INTRAMUSCULAR | Status: AC
  Filled 2011-10-21: qty 30

## 2011-10-21 MED ORDER — LIDOCAINE HCL 1 % IJ SOLN
INTRAMUSCULAR | Status: DC | PRN
  Administered 2011-10-21: 3 mL

## 2011-10-21 MED ORDER — RAMIPRIL 5 MG PO CAPS
5.0000 mg | ORAL_CAPSULE | Freq: Every morning | ORAL | Status: DC
  Administered 2011-10-22 – 2011-10-27 (×6): 5 mg via ORAL
  Filled 2011-10-21 (×8): qty 1

## 2011-10-21 MED ORDER — ALBUTEROL SULFATE HFA 108 (90 BASE) MCG/ACT IN AERS: 2.0000 | INHALATION_SPRAY | Freq: Four times a day (QID) | RESPIRATORY_TRACT | Status: DC | PRN

## 2011-10-21 MED ORDER — ONDANSETRON HCL 4 MG/2ML IJ SOLN
4.0000 mg | Freq: Four times a day (QID) | INTRAMUSCULAR | Status: DC | PRN
  Administered 2011-10-26: 4 mg via INTRAVENOUS
  Filled 2011-10-21 (×2): qty 2

## 2011-10-21 MED ORDER — MIDAZOLAM HCL 5 MG/5ML IJ SOLN
INTRAMUSCULAR | Status: DC | PRN
  Administered 2011-10-21: 1 mg via INTRAVENOUS
  Administered 2011-10-21: 2 mg via INTRAVENOUS
  Administered 2011-10-21: 1 mg via INTRAVENOUS

## 2011-10-21 MED ORDER — PANTOPRAZOLE SODIUM 40 MG PO TBEC
40.0000 mg | DELAYED_RELEASE_TABLET | Freq: Every day | ORAL | Status: DC
Start: 2011-10-21 — End: 2011-10-28
  Administered 2011-10-22 – 2011-10-27 (×6): 40 mg via ORAL
  Filled 2011-10-21 (×5): qty 1

## 2011-10-21 MED ORDER — TRAZODONE HCL 50 MG PO TABS
50.0000 mg | ORAL_TABLET | Freq: Every evening | ORAL | Status: DC | PRN
  Administered 2011-10-23 – 2011-10-27 (×4): 50 mg via ORAL
  Filled 2011-10-21 (×4): qty 1

## 2011-10-21 MED ORDER — OXYCODONE-ACETAMINOPHEN 5-325 MG PO TABS
1.0000 | ORAL_TABLET | ORAL | Status: DC | PRN
  Administered 2011-10-22 (×2): 2 via ORAL
  Administered 2011-10-23 (×2): 1 via ORAL
  Filled 2011-10-21: qty 2
  Filled 2011-10-21 (×2): qty 1
  Filled 2011-10-21: qty 2

## 2011-10-21 MED ORDER — EPHEDRINE SULFATE 50 MG/ML IJ SOLN
INTRAMUSCULAR | Status: DC | PRN
  Administered 2011-10-21: 5 mg via INTRAVENOUS

## 2011-10-21 MED ORDER — OXYCODONE HCL 5 MG PO TABS
ORAL_TABLET | ORAL | Status: AC
  Filled 2011-10-21: qty 2

## 2011-10-21 MED ORDER — DIAZEPAM 5 MG PO TABS: 10.0000 mg | ORAL_TABLET | Freq: Three times a day (TID) | ORAL | Status: DC | PRN

## 2011-10-21 MED ORDER — NEOSTIGMINE METHYLSULFATE 1 MG/ML IJ SOLN
INTRAMUSCULAR | Status: DC | PRN
  Administered 2011-10-21: 4 mg via INTRAVENOUS

## 2011-10-21 MED ORDER — DEXTROSE-NACL 5-0.45 % IV SOLN
INTRAVENOUS | Status: DC
  Administered 2011-10-21: 14:00:00 via INTRAVENOUS

## 2011-10-21 MED ORDER — SPIRONOLACTONE 25 MG PO TABS
25.0000 mg | ORAL_TABLET | Freq: Every day | ORAL | Status: DC
  Administered 2011-10-22 – 2011-10-28 (×7): 25 mg via ORAL
  Filled 2011-10-21 (×8): qty 1

## 2011-10-21 MED ORDER — ROCURONIUM BROMIDE 100 MG/10ML IV SOLN
INTRAVENOUS | Status: DC | PRN
  Administered 2011-10-21: 50 mg via INTRAVENOUS

## 2011-10-21 MED ORDER — CARVEDILOL 6.25 MG PO TABS
6.2500 mg | ORAL_TABLET | Freq: Two times a day (BID) | ORAL | Status: DC
  Administered 2011-10-22 – 2011-10-28 (×11): 6.25 mg via ORAL
  Filled 2011-10-21 (×16): qty 1

## 2011-10-21 MED ORDER — VANCOMYCIN HCL IN DEXTROSE 1-5 GM/200ML-% IV SOLN
1000.0000 mg | Freq: Two times a day (BID) | INTRAVENOUS | Status: AC
  Administered 2011-10-21: 1000 mg via INTRAVENOUS
  Filled 2011-10-21: qty 200

## 2011-10-21 MED ORDER — ATORVASTATIN CALCIUM 10 MG PO TABS
10.0000 mg | ORAL_TABLET | Freq: Every day | ORAL | Status: DC
  Administered 2011-10-22 – 2011-10-26 (×5): 10 mg via ORAL
  Filled 2011-10-21 (×8): qty 1

## 2011-10-21 MED ORDER — LEVALBUTEROL HCL 0.63 MG/3ML IN NEBU
0.6300 mg | INHALATION_SOLUTION | Freq: Four times a day (QID) | RESPIRATORY_TRACT | Status: DC
  Administered 2011-10-21 – 2011-10-28 (×28): 0.63 mg via RESPIRATORY_TRACT
  Filled 2011-10-21 (×32): qty 3

## 2011-10-21 SURGICAL SUPPLY — 61 items
ADH SKN CLS APL DERMABOND .7 (GAUZE/BANDAGES/DRESSINGS) ×8
CANISTER SUCTION 2500CC (MISCELLANEOUS) ×5 IMPLANT
CATH KIT ON Q 5IN SLV (PAIN MANAGEMENT) IMPLANT
CATH THORACIC 28FR (CATHETERS) IMPLANT
CATH THORACIC 36FR (CATHETERS) IMPLANT
CATH THORACIC 36FR RT ANG (CATHETERS) IMPLANT
CLOTH BEACON ORANGE TIMEOUT ST (SAFETY) ×3 IMPLANT
CONT SPEC 4OZ CLIKSEAL STRL BL (MISCELLANEOUS) ×4 IMPLANT
COVER SURGICAL LIGHT HANDLE (MISCELLANEOUS) ×5 IMPLANT
DERMABOND ADVANCED (GAUZE/BANDAGES/DRESSINGS) ×4
DERMABOND ADVANCED .7 DNX12 (GAUZE/BANDAGES/DRESSINGS) IMPLANT
DRAIN CHANNEL 28F RND 3/8 FF (WOUND CARE) ×1 IMPLANT
DRAPE LAPAROSCOPIC ABDOMINAL (DRAPES) ×3 IMPLANT
DRAPE SLUSH MACHINE 52X66 (DRAPES) IMPLANT
DRAPE SLUSH/WARMER DISC (DRAPES) IMPLANT
ELECT REM PT RETURN 9FT ADLT (ELECTROSURGICAL) ×3
ELECTRODE REM PT RTRN 9FT ADLT (ELECTROSURGICAL) ×2 IMPLANT
GLOVE BIO SURGEON STRL SZ 6 (GLOVE) ×2 IMPLANT
GLOVE EUDERMIC 7 POWDERFREE (GLOVE) ×7 IMPLANT
GOWN PREVENTION PLUS XLARGE (GOWN DISPOSABLE) ×4 IMPLANT
GOWN STRL NON-REIN LRG LVL3 (GOWN DISPOSABLE) ×5 IMPLANT
KIT BASIN OR (CUSTOM PROCEDURE TRAY) ×4 IMPLANT
KIT ROOM TURNOVER OR (KITS) ×3 IMPLANT
KIT WRENCH (KITS) ×1 IMPLANT
LEAD PACING EPI (Prosthesis & Implant Heart) ×2 IMPLANT
NDL HYPO 25GX1X1/2 BEV (NEEDLE) IMPLANT
NEEDLE HYPO 25GX1X1/2 BEV (NEEDLE) ×3 IMPLANT
NS IRRIG 1000ML POUR BTL (IV SOLUTION) ×5 IMPLANT
PACK CHEST (CUSTOM PROCEDURE TRAY) ×3 IMPLANT
PACK CV ACCESS (CUSTOM PROCEDURE TRAY) ×1 IMPLANT
PAD ARMBOARD 7.5X6 YLW CONV (MISCELLANEOUS) ×6 IMPLANT
SEALANT PROGEL (MISCELLANEOUS) IMPLANT
SEALANT SURG COSEAL 4ML (VASCULAR PRODUCTS) IMPLANT
SEALANT SURG COSEAL 8ML (VASCULAR PRODUCTS) IMPLANT
SOLUTION ANTI FOG 6CC (MISCELLANEOUS) ×2 IMPLANT
SPONGE GAUZE 4X4 12PLY (GAUZE/BANDAGES/DRESSINGS) ×4 IMPLANT
SUT PROLENE 3 0 SH DA (SUTURE) IMPLANT
SUT SILK  1 MH (SUTURE) ×2
SUT SILK 1 MH (SUTURE) ×4 IMPLANT
SUT SILK 2 0SH CR/8 30 (SUTURE) ×1 IMPLANT
SUT SILK 3 0SH CR/8 30 (SUTURE) IMPLANT
SUT VIC AB 1 CTX 36 (SUTURE) ×6
SUT VIC AB 1 CTX36XBRD ANBCTR (SUTURE) ×2 IMPLANT
SUT VIC AB 2-0 CT1 27 (SUTURE)
SUT VIC AB 2-0 CT1 TAPERPNT 27 (SUTURE) IMPLANT
SUT VIC AB 2-0 CTX 36 (SUTURE) ×4 IMPLANT
SUT VIC AB 3-0 MH 27 (SUTURE) IMPLANT
SUT VIC AB 3-0 SH 27 (SUTURE) ×9
SUT VIC AB 3-0 SH 27X BRD (SUTURE) IMPLANT
SUT VIC AB 3-0 X1 27 (SUTURE) ×5 IMPLANT
SUT VICRYL 2 TP 1 (SUTURE) ×3 IMPLANT
SYR CONTROL 10ML LL (SYRINGE) ×1 IMPLANT
SYSTEM SAHARA CHEST DRAIN ATS (WOUND CARE) ×3 IMPLANT
TAPE CLOTH SURG 4X10 WHT LF (GAUZE/BANDAGES/DRESSINGS) ×2 IMPLANT
TIP APPLICATOR SPRAY EXTEND 16 (VASCULAR PRODUCTS) IMPLANT
TOWEL OR 17X24 6PK STRL BLUE (TOWEL DISPOSABLE) ×4 IMPLANT
TOWEL OR 17X26 10 PK STRL BLUE (TOWEL DISPOSABLE) ×5 IMPLANT
TRAP SPECIMEN MUCOUS 40CC (MISCELLANEOUS) IMPLANT
TRAY FOLEY CATH 14FRSI W/METER (CATHETERS) ×3 IMPLANT
TUNNELER SHEATH ON-Q 11GX8 (MISCELLANEOUS) IMPLANT
WATER STERILE IRR 1000ML POUR (IV SOLUTION) ×6 IMPLANT

## 2011-10-21 NOTE — Anesthesia Preprocedure Evaluation (Addendum)
Anesthesia Evaluation  Patient identified by MRN, date of birth, ID band Patient awake    Reviewed: Allergy & Precautions, H&P , NPO status , Patient's Chart, lab work & pertinent test results, reviewed documented beta blocker date and time   Airway Mallampati: II  Neck ROM: full    Dental  (+) Teeth Intact, Dental Advisory Given and Missing,    Pulmonary shortness of breath, COPDCurrent Smoker,          Cardiovascular + angina + CAD, + Past MI and +CHF + dysrhythmias + pacemaker + Cardiac Defibrillator + Valvular Problems/Murmurs AS  S/p AVR   Neuro/Psych  Headaches, PSYCHIATRIC DISORDERS Depression  Neuromuscular disease    GI/Hepatic Neg liver ROS, hiatal hernia, PUD, GERD-  Medicated and Controlled,  Endo/Other  negative endocrine ROS  Renal/GU Renal disease     Musculoskeletal  (+) Arthritis -, Osteoarthritis,    Abdominal   Peds  Hematology negative hematology ROS (+)   Anesthesia Other Findings   Reproductive/Obstetrics                         Anesthesia Physical Anesthesia Plan  ASA: IV  Anesthesia Plan: General   Post-op Pain Management:    Induction: Intravenous  Airway Management Planned: Double Lumen EBT  Additional Equipment: Arterial line and CVP  Intra-op Plan:   Post-operative Plan: Extubation in OR  Informed Consent: I have reviewed the patients History and Physical, chart, labs and discussed the procedure including the risks, benefits and alternatives for the proposed anesthesia with the patient or authorized representative who has indicated his/her understanding and acceptance.     Plan Discussed with: CRNA, Surgeon and Anesthesiologist  Anesthesia Plan Comments:        Anesthesia Quick Evaluation

## 2011-10-21 NOTE — OR Nursing (Signed)
Upon moving the patient to the stretcher, a large tennis ball sized hematoma is noted over the left upper chest incision.  Dr. Laneta Simmers paged and notified.

## 2011-10-21 NOTE — Op Note (Signed)
NAME:  Marvin Bell, GLOGOWSKI NO.:  [id number]  MEDICAL RECORD NO.:  [id number]  LOCATION:  2316                         FACILITY:  [facility]  PHYSICIAN:  Evelene Croon, M.D.     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  OPERATIVE PROCEDURE:  Left thoracotomy and insertion of 2 epicardial left ventricular pacing leads with revision of biventricular pacemaker/defibrillator.  ATTENDING SURGEON:  Evelene Croon, MD  ANESTHESIA:  General endotracheal.  CLINICAL HISTORY:  This patient is a 66 year old gentleman with history of mostly nonischemic cardiomyopathy.  He was diagnosed with severe aortic stenosis and ejection fraction of 20% to 25% in January 2012 after presenting with shortness of breath.  Cardiac catheterization at that time showed normal coronary arteries with the exception of total occlusion of the mid obtuse marginal sleeve.  The patient was diagnosed with severe aortic stenosis with a valve area of 0.52 cm2 and low gradient across the aortic valve of only 20 mean.  He was referred to [facility] for consideration of high-risk aortic valve replacement and the patient underwent bioprosthetic aortic valve replacement there in [place] 2012.  A postoperative echocardiogram in September 2012 showed ejection fraction of 15%.  Repeat echocardiogram in March 2013 showed an ejection fraction of 20% to 25% with left ventricular dilatation.  He remained symptomatic with congestive heart failure and in April 2013 underwent insertion of a CRT-D device for left bundle-branch block morphology with congestive heart failure.  This was a Medtronic  device. The left ventricular lead could not be placed successfully through the coronary sinus due to tortuosity.  The patient continued to have shortness of breath with mild exertion and was not able to do any kind of work around the house and has had decreased appetite.  It was felt that this was an indication to proceed with left thoracotomy for insertion of left ventricular epicardial pacing lead to complete his biventricular pacing system for resynchronization therapy.  I discussed the operative procedure with the patient and his wife including alternatives, benefits, and risks including, but not limited to bleeding, blood transfusion, infection, malfunction of the existing system requiring revision, and the possibility that this may not improve his symptoms at all.  He understood and agreed to proceed.  OPERATIVE PROCEDURE:  The patient was seen in the preoperative holding area and proper patient, proper operation, proper operative side were confirmed with the patient and chart after reviewing his medical records and x-ray.  The left side of the chest was signed by me.  He was given intravenous antibiotics including vancomycin.  He was taken back to the operating room, placed on the table in a supine position.  After induction of general endotracheal anesthesia using a double-lumen tube, a Foley catheter was placed in bladder using sterile technique.  The patient was then positioned supine with a roll beneath the left side of the chest to raise this area up slightly.  Then  the neck and chest and abdomen were prepped with Betadine soap and solution, draped in usual sterile manner.  Time-out was taken, proper patient, proper operation were confirmed with nursing and anesthesia staff.  Then, a small left thoracotomy was made anterolaterally just below the pectoralis muscle. This was carried down to the subcutaneous tissue using electrocautery. The pleural space was entered through  approximately the 5th or 6th intercostal space.  A small retractor was placed.  The heart was easily visualized.  The phrenic nerve was identified and carefully avoided. The left lung did have adhesions present, but these were located posteriorly and near the apex.  The pericardium was then opened longitudinally just posterior to the phrenic nerve where the pericardium did not appear adherent to the heart.  This area appeared to have reasonable muscle contractility.  Then, a Medtronic screw in pacing lead was positioned on the mid lateral wall of the left ventricle.  The impedance was 1342 with a threshold of 1.4 V at 0.5 msec.  Sensing showed an R-wave of 21.1 mV.  This was felt to be satisfactory.  This lead had model number 5071-53 cm and serial number [id number].  Then a second left ventricular screw-in lead was placed.  This was also a Medtronic lead with model number 5071-53 cm and serial number [id number].  This lead was positioned slightly more towards the apex about 2 cm distal to the first lead.  This had a threshold of 1.8 V at 0.5 msec with impedance of 1396 and a sensing R-wave of 7.6 mV.  We decided to first lead up to the system and cap the second lead.  Then, attention was turned to the generator.  The previous generator pocket was opened through the old incision scar.  The pocket was entered and the generator removed from the pocket along with the pleural leads.  The pocket was extended inferiorly using electric cautery to open the base of the pocket.  Then the first left ventricular lead ending in serial number [id number] was connected to the device.  The device was a Protecta XT-CRT-D with serial number [id number].  After connecting the leads, the device was tested by the Med[employer] representative who was in the room.  It was functioning appropriately and was programmed by him.  The generator was then placed in the pocket and excess lead was coiled up behind the generator.   There was complete hemostasis.  Then, the pocket was closed with continuous 3-0 Vicryl suture.  Subcutaneous tissue was closed with continuous 3-0 Vicryl suture and the skin with 3-0 Vicryl subcuticular skin stitch.  Dermabond was placed over this incision followed by dry sterile dressing.  Then attention was turned to the thoracotomy incision.  It should be noted that we brought up the leads out through the thoracotomy interspace and then tunnelled them in the subcutaneous plane up to the device pocket.  Then the pleural space was drained using a 28-French soft Blake drain, which was positioned posteriorly and up towards the apex.  This was brought out through a separate stab incision.  Then the ribs were approximated with #2 Vicryl pericostal sutures.  The lung was reinflated.  The muscle was reapproximated with continuous 0-Vicryl suture.  Subcutaneous tissue was reapproximated with continuous 2-0 Vicryl and the skin with a 3-0 Vicryl subcuticular closure.  The incision was then coated with Dermabond and covered with dry dressing.  Sponge, needle, and instrument counts were correct according to the scrub nurse.  Dry sterile  dressing applied over the incision and around the chest tube, which was hooked to Pleur-Evac suction.  The patient was then awakened and extubated.  Just as he was being transferred, the nurse anesthetist noted that there was a sudden development of swelling over the generator pocket.  I was called back to the operating room to examine this and was concerned that there could be development of hematoma within the pocket.  I cannot tell whether this was residual air or hematoma, but I thought it would be best to explore the pocket and be sure.  While the patient was being ventilated by mask under the direction of Anesthesiology, the left chest was prepped again with Betadine soap and solution and draped in usual sterile manner.  The Dermabond was removed from the  device incision.  The skin and subcutaneous tissues were anesthetized with 1% lidocaine local anesthesia, and then the subcuticular and subcutaneous suture layers were removed.  There was immediate release of large amount of air from the pocket and there was no fluid present.  Then the subcutaneous tissue was reapproximated with continuous 3-0 Vicryl suture and the skin with a 3-0 Vicryl subcuticular closure.  Dermabond was applied over the incision. The sponge, needle, and instrument counts were correct according to the scrub nurse.  Dry sterile dressing was applied over this incision.  The patient was then transported to the post anesthesia care unit in a satisfactory and stable condition.     Evelene Croon, M.D.     BB/MEDQ  D:  10/21/2011  T:  10/21/2011  Job:  [id number]
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
## 2011-10-21 NOTE — Interval H&P Note (Signed)
History and Physical Interval Note:  10/21/2011 8:29 AM  Marvin Bell  has presented today for surgery, with the diagnosis of CHF Aortic stenosis  The various methods of treatment have been discussed with the patient and family. After consideration of risks, benefits and other options for treatment, the patient has consented to  Procedure(s) (LRB): THORACOTOMY MAJOR (Left) EPICARDIAL PACING LEAD PLACEMENT (N/A) as a surgical intervention .  The patient's history has been reviewed, patient examined, no change in status, stable for surgery.  I have reviewed the patient's chart and labs.  Questions were answered to the patient's satisfaction.     Alleen Borne

## 2011-10-21 NOTE — Anesthesia Procedure Notes (Signed)
Procedure Name: Intubation Date/Time: 10/21/2011 8:50 AM Performed by: Nicholos Johns Pre-anesthesia Checklist: Patient identified, Emergency Drugs available, Suction available, Patient being monitored and Timeout performed Patient Re-evaluated:Patient Re-evaluated prior to inductionOxygen Delivery Method: Circle system utilized Preoxygenation: Pre-oxygenation with 100% oxygen Intubation Type: IV induction Ventilation: Mask ventilation without difficulty Laryngoscope Size: Mac and 4 Grade View: Grade I Tube type: Oral Endobronchial tube: Left and Double lumen EBT and 39 Fr Airway Equipment and Method: Stylet Placement Confirmation: ETT inserted through vocal cords under direct vision,  positive ETCO2 and breath sounds checked- equal and bilateral Tube secured with: Tape Dental Injury: Teeth and Oropharynx as per pre-operative assessment

## 2011-10-21 NOTE — Brief Op Note (Signed)
10/21/2011  10:54 AM  PATIENT:  Marvin Bell  66 y.o. male  PRE-OPERATIVE DIAGNOSIS:  CHF Aortic stenosis  POST-OPERATIVE DIAGNOSIS:  Congestive Heart Failure, Aortic Stenosis  PROCEDURE:  Procedure(s) (LRB): THORACOTOMY MAJOR (Left) EPICARDIAL PACING LEAD PLACEMENT (N/A)  SURGEON:  Surgeon(s) and Role:    * Alleen Borne, MD - Primary  PHYSICIAN ASSISTANT: none  ASSISTANTS: none   ANESTHESIA:   general  EBL:  Total I/O In: 700 [I.V.:700] Out: 425 [Urine:425]  BLOOD ADMINISTERED:none  DRAINS: (1) Blake drain(s) in the left pleural space   LOCAL MEDICATIONS USED:  NONE  COUNTS:  YES  PLAN OF CARE: Admit to inpatient   PATIENT DISPOSITION:  PACU - hemodynamically stable.   Delay start of Pharmacological VTE agent (>24hrs) due to surgical blood loss or risk of bleeding: yes

## 2011-10-21 NOTE — OR Nursing (Signed)
Patient was repositioned supine, arms tucked at sides, pillow under knees.  Patient prepped left upper chest from midline to bed, shoulder to nipple line.

## 2011-10-21 NOTE — Progress Notes (Signed)
Patient ID: Marvin Bell, male   DOB: 09-11-1945, 66 y.o.   MRN: [id number]   Filed Vitals:   10/21/11 1600 10/21/11 1630 10/21/11 1700 10/21/11 1730  BP: 93/56 93/54 93/59  96/55  Pulse: 73 70 68 65  Temp:      TempSrc:      Resp: 20 21 22 23   Height:      Weight:      SpO2: 100% 99% 98% 97%   CT output low. No air leak  Urine output ok.  Stable postop

## 2011-10-21 NOTE — Transfer of Care (Signed)
Immediate Anesthesia Transfer of Care Note  Patient: Marvin Bell  Procedure(s) Performed: Procedure(s) (LRB): THORACOTOMY MAJOR (Left) EPICARDIAL PACING LEAD PLACEMENT (N/A)  Patient Location: PACU  Anesthesia Type: General  Level of Consciousness: awake and sedated  Airway & Oxygen Therapy: Patient Spontanous Breathing and Patient connected to face mask oxygen  Post-op Assessment: Report given to PACU RN and Post -op Vital signs reviewed and stable  Post vital signs: Reviewed and stable  Complications: No apparent anesthesia complications

## 2011-10-21 NOTE — Anesthesia Postprocedure Evaluation (Signed)
Anesthesia Post Note  Patient: Marvin Bell  Procedure(s) Performed: Procedure(s) (LRB): THORACOTOMY MAJOR (Left) EPICARDIAL PACING LEAD PLACEMENT (N/A)  Anesthesia type: General  Patient location: PACU  Post pain: Pain level controlled and Adequate analgesia  Post assessment: Post-op Vital signs reviewed, Patient's Cardiovascular Status Stable, Respiratory Function Stable, Patent Airway and Pain level controlled  Last Vitals:  Filed Vitals:   10/21/11 1200  BP:   Pulse:   Temp: 36.7 C  Resp:     Post vital signs: Reviewed and stable  Level of consciousness: awake, alert  and oriented  Complications: No apparent anesthesia complications

## 2011-10-21 NOTE — Preoperative (Signed)
Beta Blockers   Reason not to administer Beta Blockers:Not Applicable, taken this am per patient 

## 2011-10-22 ENCOUNTER — Inpatient Hospital Stay (HOSPITAL_COMMUNITY): Payer: Medicare Other

## 2011-10-22 ENCOUNTER — Encounter (HOSPITAL_COMMUNITY): Payer: Self-pay | Admitting: Surgery

## 2011-10-22 LAB — CBC
HCT: 36.9 % — ABNORMAL LOW (ref 39.0–52.0)
Hemoglobin: 12.7 g/dL — ABNORMAL LOW (ref 13.0–17.0)
MCHC: 34.4 g/dL (ref 30.0–36.0)
MCV: 92.3 fL (ref 78.0–100.0)

## 2011-10-22 LAB — BLOOD GAS, ARTERIAL
Acid-Base Excess: 0.1 mmol/L (ref 0.0–2.0)
Bicarbonate: 24.9 mEq/L — ABNORMAL HIGH (ref 20.0–24.0)
O2 Saturation: 93.2 %
pCO2 arterial: 44.8 mmHg (ref 35.0–45.0)
pO2, Arterial: 68 mmHg — ABNORMAL LOW (ref 80.0–100.0)

## 2011-10-22 LAB — GLUCOSE, CAPILLARY
Glucose-Capillary: 116 mg/dL — ABNORMAL HIGH (ref 70–99)
Glucose-Capillary: 110 mg/dL — ABNORMAL HIGH (ref 70–99)
Glucose-Capillary: 114 mg/dL — ABNORMAL HIGH (ref 70–99)

## 2011-10-22 LAB — BASIC METABOLIC PANEL
BUN: 12 mg/dL (ref 6–23)
Creatinine, Ser: 1.13 mg/dL (ref 0.50–1.35)
GFR calc non Af Amer: 66 mL/min — ABNORMAL LOW (ref >90)
Glucose, Bld: 128 mg/dL — ABNORMAL HIGH (ref 70–99)
Potassium: 4.3 mEq/L (ref 3.5–5.1)

## 2011-10-22 LAB — POCT I-STAT, CHEM 8
Chloride: 98 mEq/L (ref 96–112)
Glucose, Bld: 100 mg/dL — ABNORMAL HIGH (ref 70–99)
HCT: 40 % (ref 39.0–52.0)
Hemoglobin: 13.6 g/dL (ref 13.0–17.0)
Potassium: 3.9 mEq/L (ref 3.5–5.1)
Sodium: 134 mEq/L — ABNORMAL LOW (ref 135–145)

## 2011-10-22 MED ORDER — FUROSEMIDE 10 MG/ML IJ SOLN
40.0000 mg | Freq: Once | INTRAMUSCULAR | Status: AC
  Administered 2011-10-22: 40 mg via INTRAVENOUS
  Filled 2011-10-22: qty 4

## 2011-10-22 MED ORDER — DIAZEPAM 5 MG PO TABS: 5.0000 mg | ORAL_TABLET | Freq: Three times a day (TID) | ORAL | Status: DC | PRN

## 2011-10-22 MED ORDER — FUROSEMIDE 10 MG/ML IJ SOLN
40.0000 mg | Freq: Once | INTRAMUSCULAR | Status: AC
  Administered 2011-10-22: 40 mg via INTRAVENOUS

## 2011-10-22 NOTE — Progress Notes (Signed)
Pt is having an increase in WOB, with saturations on 6L [place] amounting to 70%. Pt placed on venturi mask then NRB with sats now being 92%. Lung sounds are diminished bilaterally. Pt still c/o being SOB after NRB and WOB is still increased. Pt repositioned in bed with HOB raised to 45 degrees and TCDB with IS performed with no change in O2 level. MD Bartle called to make aware- new orders received. Will monitor  Marvin Bell

## 2011-10-22 NOTE — Progress Notes (Signed)
SICU p.m. Rounds  Status post left mini thoracotomy and placement of epicardial pacing leads for LV dysfunction yesterday  Patient with severe COPD on nonrebreather O2 mask with sats 90% Patient neurologically intact and responsive breath sounds are present bilaterally Left chest tube has been removed earlier today Blood pressure stable

## 2011-10-22 NOTE — Progress Notes (Signed)
Pt's 0800 blood pressure soft at 89 systolic. MD has lasix and coreg ordered to give at this time- MD [facility] aware and gave OK to administer meds.  Felipa Emory

## 2011-10-22 NOTE — Progress Notes (Signed)
Patient moaning in bed.  Rates left sided pain 3/10.  VSS: hr 82, 113/49, rr 27, sO2 100%.  Patient rec'd 2mg  morphine and 1 percocet around 0130.  Patient's  Breathing is unlabored but shallow.  Patient offered pain medication but refused.  Will continue to monitor.

## 2011-10-22 NOTE — Progress Notes (Signed)
1 Day Post-Op Procedure(s) (LRB): THORACOTOMY MAJOR (Left) EPICARDIAL PACING LEAD PLACEMENT (N/A) Subjective: Pain under control No complaints  Objective: Vital signs in last 24 hours: Desaturation this am into 80's with activity Temp:  [97.3 F (36.3 C)-98.5 F (36.9 C)] 98.3 F (36.8 C) (07/25 0400) Pulse Rate:  [52-139] 79  (07/25 0700) Cardiac Rhythm:  [-] Normal sinus rhythm (07/25 0000) Resp:  [15-29] 18  (07/25 0700) BP: (87-125)/(43-106) 95/55 mmHg (07/25 0700) SpO2:  [78 %-100 %] 90 % (07/25 0726) Arterial Line BP: (83-127)/(43-54) 109/48 mmHg (07/25 0700) Weight:  [71.8 kg (158 lb 4.6 oz)-72.4 kg (159 lb 9.8 oz)] 72.4 kg (159 lb 9.8 oz) (07/25 0600)  Hemodynamic parameters for last 24 hours:    Intake/Output from previous day: 07/24 0701 - 07/25 0700 In: 2090 [P.O.:340; I.V.:1300; IV Piggyback:450] Out: 2110 [Urine:1630; Blood:80; Chest Tube:400] Intake/Output this shift:    General appearance: alert and cooperative Neurologic: intact Heart: regular rate and rhythm, S1, S2 normal, no murmur, click, rub or gallop Lungs: diminished breath sounds bibasilar Extremities: extremities normal, atraumatic, no cyanosis or edema Wound: dressing dry  Lab Results:  Basename 10/22/11 0405 10/20/11 1427  WBC 15.3* 13.0*  HGB 12.7* 14.3  HCT 36.9* 40.8  PLT 155 190   BMET:  Basename 10/22/11 0405 10/20/11 1427  NA 134* 136  K 4.3 4.4  CL 100 102  CO2 27 22  GLUCOSE 128* 91  BUN 12 12  CREATININE 1.13 1.36*  CALCIUM 8.6 9.2    PT/INR:  Basename 10/20/11 1427  LABPROT 13.8  INR 1.04   ABG    Component Value Date/Time   PHART 7.400 10/20/2011 1427   HCO3 24.1* 10/20/2011 1427   TCO2 25.3 10/20/2011 1427   ACIDBASEDEF 0.1 10/20/2011 1427   O2SAT 96.6 10/20/2011 1427   CBG (last 3)   Basename 10/22/11 0321 10/22/11 0113 10/21/11 1939  GLUCAP 110* 116* 119*    Assessment/Plan: S/P Procedure(s) (LRB): THORACOTOMY MAJOR (Left) EPICARDIAL PACING LEAD  PLACEMENT (N/A) Mobilize Diuresis d/c tubes/lines Continue foley due to patient in ICU and urinary output monitoring   LOS: 1 day    BARTLE,BRYAN K 10/22/2011

## 2011-10-22 NOTE — Progress Notes (Signed)
Patient maintaining sO2 of 90% on non-rebreather mask.  Requires frequent reminders to keep mask on, desats quickly without mask.  Patient follows commands and is oriented to self, place, and situation although he does make inappropriate comments at times.  Bilateral breath sounds diminished throughout.  Pulling 750 on incentive spirometer.  Denies pain.  Will continue to encourage pulmonary toilet and monitor.

## 2011-10-22 NOTE — Progress Notes (Signed)
Pt's sO2 dropped to mid 80's getting from bed to chair.  Oxygen increased to 6L from 2L nasal cannula.  Pt's breathing shallow and labored.  Chest rise symmetrical with diminished breath sounds throughout.  2mg  IV morphine and 1 percocet given to ease pain and facilitate deep breathing.  Currently patient's RR 16 and sO2 90% on 6L nasal cannula.  Will continue to monitor.

## 2011-10-23 ENCOUNTER — Inpatient Hospital Stay (HOSPITAL_COMMUNITY): Payer: Medicare Other

## 2011-10-23 LAB — BASIC METABOLIC PANEL
CO2: 28 mEq/L (ref 19–32)
Chloride: 94 mEq/L — ABNORMAL LOW (ref 96–112)
Creatinine, Ser: 1.2 mg/dL (ref 0.50–1.35)
GFR calc Af Amer: 71 mL/min — ABNORMAL LOW (ref >90)
Potassium: 3.9 mEq/L (ref 3.5–5.1)

## 2011-10-23 NOTE — Care Management Note (Signed)
    Page 1 of 1   10/28/2011     1:36:07 PM   CARE MANAGEMENT NOTE 10/28/2011  Patient:  Marvin Bell, Marvin Bell   Account Number:  [id number]  Date Initiated:  10/23/2011  Documentation initiated by:  Alyxandria Wentz  Subjective/Objective Assessment:   PT S/P THORACOTOMY AND EPICARDIAL PACING LEAD PLACEMENT ON 10/21/11.  PTA, PT INDEPENDENT, LIVES WITH SPOUSE.     Action/Plan:   MET WITH PT AND SON TO DISCUSS DC PLANS.  SPOUSE TO PROVIDE 24HR CARE AT DISCHARGE. WILL FOLLOW FOR HOME NEEDS AS PT PROGRESSES.   Anticipated DC Date:  10/27/2011   Anticipated DC Plan:  HOME W HOME HEALTH SERVICES      DC Planning Services  CM consult      Choice offered to / List presented to:             Status of service:  Completed, signed off Medicare Important Message given?   (If response is "NO", the following Medicare IM given date fields will be blank) Date Medicare IM given:   Date Additional Medicare IM given:    Discharge Disposition:  HOME/SELF CARE  Per UR Regulation:  Reviewed for med. necessity/level of care/duration of stay  If discussed at Long Length of Stay Meetings, dates discussed:    Comments:  10/28/11 Treyshon Buchanon,RN,BSN 1030 PT FOR DC HOME TODAY.  PT AND WIFE DENY ANY NEEDS FOR HOME.  10-26-11 2:15pm Johny Shears - [phone] Verified Mr. Wollman does live at home with wife who is with him 24/7 and does not work.  Also has daughter and son - now in [place] on vacation, availble to assist on discharge.  At this time - no HH needs identified.

## 2011-10-23 NOTE — Progress Notes (Signed)
Pt responded well to ambulation. O2 sat maintained above 87% throughout ambulation. Pt O2 sat at rest is now 98% on 5L [place].  Marvin Bell

## 2011-10-23 NOTE — Progress Notes (Signed)
2 Days Post-Op Procedure(s) (LRB): THORACOTOMY MAJOR (Left) EPICARDIAL PACING LEAD PLACEMENT (N/A) Subjective: No complaints  Objective: Vital signs in last 24 hours: Temp:  [97.7 F (36.5 C)-98.5 F (36.9 C)] 97.7 F (36.5 C) (07/26 0735) Pulse Rate:  [66-96] 96  (07/26 0800) Cardiac Rhythm:  [-] Normal sinus rhythm (07/26 0800) Resp:  [15-27] 20  (07/26 0800) BP: (76-124)/(43-104) 109/63 mmHg (07/26 0800) SpO2:  [84 %-95 %] 85 % (07/26 0800)  86% on 6L this am Arterial Line BP: (112)/(44) 112/44 mmHg (07/25 0900) FiO2 (%):  [50 %-100 %] 100 % (07/26 0212) Weight:  [70.7 kg (155 lb 13.8 oz)] 70.7 kg (155 lb 13.8 oz) (07/26 0700)  Hemodynamic parameters for last 24 hours:    Intake/Output from previous day: 07/25 0701 - 07/26 0700 In: 789 [P.O.:785; IV Piggyback:4] Out: 2395 [Urine:2370; Chest Tube:25] Intake/Output this shift:    General appearance: alert and cooperative Heart: regular rate and rhythm, S1, S2 normal, no murmur, click, rub or gallop Lungs: diminished breath sounds bibasilar Wound: dressing dry  Lab Results:  Basename 10/22/11 1530 10/22/11 0405 10/20/11 1427  WBC -- 15.3* 13.0*  HGB 13.6 12.7* --  HCT 40.0 36.9* --  PLT -- 155 190   BMET:  Basename 10/23/11 0230 10/22/11 1530 10/22/11 0405  NA 134* 134* --  K 3.9 3.9 --  CL 94* 98 --  CO2 28 -- 27  GLUCOSE 95 100* --  BUN 17 14 --  CREATININE 1.20 1.30 --  CALCIUM 9.1 -- 8.6    PT/INR:  Basename 10/20/11 1427  LABPROT 13.8  INR 1.04   ABG    Component Value Date/Time   PHART 7.363 10/22/2011 1520   HCO3 24.9* 10/22/2011 1520   TCO2 24 10/22/2011 1530   ACIDBASEDEF 0.1 10/20/2011 1427   O2SAT 93.2 10/22/2011 1520   CBG (last 3)   Basename 10/22/11 0809 10/22/11 0321 10/22/11 0113  GLUCAP 114* 110* 116*   CXR:  Bibasilar atelectasis  Assessment/Plan: S/P Procedure(s) (LRB): THORACOTOMY MAJOR (Left) EPICARDIAL PACING LEAD PLACEMENT (N/A) Continue IS DC foley, central line Keep  in ICU for pulmonary toilet with sats in 80's on 6L.   LOS: 2 days    Evelene Croon K 10/23/2011

## 2011-10-23 NOTE — Progress Notes (Signed)
[facility] BRIEF SICU PROGRESS NOTE  2 Days Post-Op  S/P Procedure(s) (LRB): THORACOTOMY MAJOR (Left) EPICARDIAL PACING LEAD PLACEMENT (N/A)   Stable hemodynamics O2 sats 88-92% on 5 L/min via [place] UOP adequate  Plan: Continue current plan  OWEN,CLARENCE H 10/23/2011 5:51 PM

## 2011-10-23 NOTE — Progress Notes (Signed)
Patient oriented to self, time, and place.  Forgetful at times, attempts to get out of bed by himself.  Bed alarm is not functioning.  Sitter in room for patient safety.

## 2011-10-24 ENCOUNTER — Inpatient Hospital Stay (HOSPITAL_COMMUNITY): Payer: Medicare Other

## 2011-10-24 LAB — GLUCOSE, CAPILLARY

## 2011-10-24 MED ORDER — POTASSIUM CHLORIDE CRYS ER 20 MEQ PO TBCR
20.0000 meq | EXTENDED_RELEASE_TABLET | Freq: Every day | ORAL | Status: AC
  Administered 2011-10-24 – 2011-10-26 (×3): 20 meq via ORAL
  Filled 2011-10-24 (×2): qty 1

## 2011-10-24 MED ORDER — FUROSEMIDE 40 MG PO TABS
40.0000 mg | ORAL_TABLET | Freq: Every day | ORAL | Status: AC
  Administered 2011-10-24 – 2011-10-26 (×3): 40 mg via ORAL
  Filled 2011-10-24 (×3): qty 1

## 2011-10-24 NOTE — Progress Notes (Signed)
Pt t/x to 2018 in wheelchair on monitor without event. Pt's wife phoned to make aware of t/x. Report called to [facility] RN on 2000 and she was present for bedside report and telemetry hookup.   Felipa Emory

## 2011-10-24 NOTE — Progress Notes (Signed)
Pt had 14 bits of non sustained V-tach. Pt asymptomatic during the episode.Dr Barry Dienes notified. Will continue to monitor.

## 2011-10-24 NOTE — Progress Notes (Signed)
   CARDIOTHORACIC SURGERY PROGRESS NOTE   R3 Days Post-Op Procedure(s) (LRB): THORACOTOMY MAJOR (Left) EPICARDIAL PACING LEAD PLACEMENT (N/A)  Subjective: No complaints.  Minimal pain.  Confusion reportedly improved.  Objective: Vital signs: BP Readings from Last 1 Encounters:  10/24/11 100/53   Pulse Readings from Last 1 Encounters:  10/24/11 95   Resp Readings from Last 1 Encounters:  10/24/11 20   Temp Readings from Last 1 Encounters:  10/24/11 98.6 F (37 C) Oral    Hemodynamics:    Physical Exam:  Rhythm:   sinus  Breath sounds: clear  Heart sounds:  RRR  Incisions:  Dressings dry  Abdomen:  soft  Extremities:  warm   Intake/Output from previous day: 07/26 0701 - 07/27 0700 In: 1590 [P.O.:1590] Out: 1580 [Urine:1580] Intake/Output this shift:    Lab Results:  Basename 10/22/11 1530 10/22/11 0405  WBC -- 15.3*  HGB 13.6 12.7*  HCT 40.0 36.9*  PLT -- 155   BMET:  Basename 10/23/11 0230 10/22/11 1530 10/22/11 0405  NA 134* 134* --  K 3.9 3.9 --  CL 94* 98 --  CO2 28 -- 27  GLUCOSE 95 100* --  BUN 17 14 --  CREATININE 1.20 1.30 --  CALCIUM 9.1 -- 8.6    CBG (last 3)   Basename 10/22/11 0809 10/22/11 0321 10/22/11 0113  GLUCAP 114* 110* 116*   ABG    Component Value Date/Time   PHART 7.363 10/22/2011 1520   HCO3 24.9* 10/22/2011 1520   TCO2 24 10/22/2011 1530   ACIDBASEDEF 0.1 10/20/2011 1427   O2SAT 93.2 10/22/2011 1520   CXR: Stable mild bibasilar atelectasis  Assessment/Plan: S/P Procedure(s) (LRB): THORACOTOMY MAJOR (Left) EPICARDIAL PACING LEAD PLACEMENT (N/A)  Overall stable POD3 Unclear what the pacemaker is set for - not currently pacing at all Chronic systolic CHF stable Acute on chronic hypoxemia, improving Postop confusion and delerium improved   Mobilize  Diuresis  Transfer step down  Needs f/u from Cardiology team  [facility] H 10/24/2011 9:30 AM

## 2011-10-25 ENCOUNTER — Inpatient Hospital Stay (HOSPITAL_COMMUNITY): Payer: Medicare Other

## 2011-10-25 DIAGNOSIS — I5022 Chronic systolic (congestive) heart failure: Secondary | ICD-10-CM

## 2011-10-25 LAB — CBC
HCT: 37.1 % — ABNORMAL LOW (ref 39.0–52.0)
Hemoglobin: 12.8 g/dL — ABNORMAL LOW (ref 13.0–17.0)
MCV: 91.6 fL (ref 78.0–100.0)
RBC: 4.05 MIL/uL — ABNORMAL LOW (ref 4.22–5.81)
RDW: 13.2 % (ref 11.5–15.5)
WBC: 11.6 10*3/uL — ABNORMAL HIGH (ref 4.0–10.5)

## 2011-10-25 LAB — BASIC METABOLIC PANEL
BUN: 16 mg/dL (ref 6–23)
CO2: 29 mEq/L (ref 19–32)
Calcium: 9.3 mg/dL (ref 8.4–10.5)
Glucose, Bld: 105 mg/dL — ABNORMAL HIGH (ref 70–99)
Potassium: 3.4 mEq/L — ABNORMAL LOW (ref 3.5–5.1)
Sodium: 138 mEq/L (ref 135–145)

## 2011-10-25 NOTE — Progress Notes (Signed)
   Pt well known to Dr Graciela Husbands now admitted for epicardial LV lead placement.  SUBJECTIVE: The patient is doing well today.  He is making slow recovery s/p LV lead placement.  He has been ambulating in the halls with SOB.     [name] aspirin EC  81 mg Oral Daily  . atorvastatin  10 mg Oral q1800  . bisacodyl  10 mg Oral Daily  . carvedilol  6.25 mg Oral BID WC  . furosemide  40 mg Oral Daily  . levalbuterol  0.63 mg Nebulization Q6H  . pantoprazole  40 mg Oral Q1200  . potassium chloride  20 mEq Oral Daily  . ramipril  5 mg Oral q morning - 10a  . spironolactone  25 mg Oral Daily  . venlafaxine XR  75 mg Oral BID      OBJECTIVE: Physical Exam: Filed Vitals:   10/25/11 0211 10/25/11 0500 10/25/11 0752 10/25/11 0948  BP:  97/60    Pulse:  88    Temp:  98.2 F (36.8 C)    TempSrc:  Oral    Resp:  18    Height:      Weight:      SpO2: 92% 91% 94% 91%    Intake/Output Summary (Last 24 hours) at 10/25/11 1414 Last data filed at 10/24/11 2222  Gross per 24 hour  Intake    480 ml  Output   1200 ml  Net   -720 ml    Telemetry reveals sinus rhythm/ V paced  GEN- The patient is chronically ill appearing, alert and oriented x 3 today.   Head- normocephalic, atraumatic Eyes-  Sclera clear, conjunctiva pink Ears- hearing intact Oropharynx- clear Neck- supple,  Lungs- Clear to ausculation bilaterally, normal work of breathing Heart- Regular rate and rhythm,  GI- soft, NT, ND, + BS Extremities- no clubbing, cyanosis, trace edema Skin- no rash or lesion, dressing over device sight is stable Psych- euthymic mood, full affect Neuro- strength and sensation are intact  LABS: Basic Metabolic Panel:  Basename 10/25/11 0540 10/23/11 0230  NA 138 134*  K 3.4* 3.9  CL 99 94*  CO2 29 28  GLUCOSE 105* 95  BUN 16 17  CREATININE 1.07 1.20  CALCIUM 9.3 9.1  MG -- --  PHOS -- --   Liver Function Tests: No results found for this basename:  AST:2,ALT:2,ALKPHOS:2,BILITOT:2,PROT:2,ALBUMIN:2 in the last 72 hours No results found for this basename: LIPASE:2,AMYLASE:2 in the last 72 hours CBC:  Basename 10/25/11 0540 10/22/11 1530  WBC 11.6* --  NEUTROABS -- --  HGB 12.8* 13.6  HCT 37.1* 40.0  MCV 91.6 --  PLT 213 --   ASSESSMENT AND PLAN:  Active Problems:  * No active hospital problems. *   1.  Chronic systolic dysfunction Recovering s/p Epicardial LV lead placement. Telemetry reveals that he is ventricularly paced.  I will obtain an ekg to evaluate the QRS morphology. I will also ask medtronic to re-interrogate the device, though interrogation 10/22/10 is reviewed and is normal. Continue current medical management for CHF  We will follow as he recovers from his recent surgical procedure.   Hillis Range, MD 10/25/2011 2:14 PM

## 2011-10-25 NOTE — Progress Notes (Addendum)
[address]            [place] [id number]          [phone]     4 Days Post-Op  Procedure(s) (LRB): THORACOTOMY MAJOR (Left) EPICARDIAL PACING LEAD PLACEMENT (N/A) Subjective: Weak, but improving  Objective  Telemetry appears to be vpacing, some wide complex beats  Temp:  [98 F (36.7 C)-99.7 F (37.6 C)] 98.2 F (36.8 C) (07/28 0500) Pulse Rate:  [75-100] 88  (07/28 0500) Resp:  [16-20] 18  (07/28 0500) BP: (84-104)/(46-78) 97/60 mmHg (07/28 0500) SpO2:  [89 %-96 %] 94 % (07/28 0752)   Intake/Output Summary (Last 24 hours) at 10/25/11 0849 Last data filed at 10/24/11 2222  Gross per 24 hour  Intake    480 ml  Output   2200 ml  Net  -1720 ml       General appearance: alert, cooperative and no distress Heart: regular rate and rhythm and S1, S2 normal Lungs: diminished in bases Abdomen: benign exam Extremities: no edema Wound: echymosis, incis healing well  Lab Results:  Basename 10/25/11 0540 10/23/11 0230  NA 138 134*  K 3.4* 3.9  CL 99 94*  CO2 29 28  GLUCOSE 105* 95  BUN 16 17  CREATININE 1.07 1.20  CALCIUM 9.3 9.1  MG -- --  PHOS -- --   No results found for this basename: AST:2,ALT:2,ALKPHOS:2,BILITOT:2,PROT:2,ALBUMIN:2 in the last 72 hours No results found for this basename: LIPASE:2,AMYLASE:2 in the last 72 hours  Basename 10/25/11 0540 10/22/11 1530  WBC 11.6* --  NEUTROABS -- --  HGB 12.8* 13.6  HCT 37.1* 40.0  MCV 91.6 --  PLT 213 --   No results found for this basename: CKTOTAL:4,CKMB:4,TROPONINI:4 in the last 72 hours No components found with this basename: POCBNP:3 No results found for this basename: DDIMER in the last 72 hours No results found for this basename: HGBA1C in the last 72 hours No results found for this basename: CHOL,HDL,LDLCALC,TRIG,CHOLHDL in the last 72 hours No results found for this basename: TSH,T4TOTAL,FREET3,T3FREE,THYROIDAB in the last 72 hours No results found for this  basename: VITAMINB12,FOLATE,FERRITIN,TIBC,IRON,RETICCTPCT in the last 72 hours  Medications: Scheduled    . aspirin EC  81 mg Oral Daily  . atorvastatin  10 mg Oral q1800  . bisacodyl  10 mg Oral Daily  . carvedilol  6.25 mg Oral BID WC  . furosemide  40 mg Oral Daily  . levalbuterol  0.63 mg Nebulization Q6H  . pantoprazole  40 mg Oral Q1200  . potassium chloride  20 mEq Oral Daily  . ramipril  5 mg Oral q morning - 10a  . spironolactone  25 mg Oral Daily  . venlafaxine XR  75 mg Oral BID     Radiology/Studies:  Dg Chest Port 1 View  10/24/2011  *RADIOLOGY REPORT*  Clinical Data: Postop from left thoracotomy for epicardial pacing leads.  COPD.  Previous myocardial infarct.  PORTABLE CHEST - 1 VIEW  Comparison: 10/23/2011  Findings: Low lung volumes are again seen.  Bibasilar atelectasis shows no significant change.  Cardiomegaly stable.  No evidence of congestive heart failure.  No pneumothorax identified.  IMPRESSION: Stable low lung volumes and bibasilar atelectasis.  Stable cardiomegaly, without evidence of congestive failure.  Original Report Authenticated By: Danae Orleans, M.D.    INR: Will add last result for INR, ABG once components are confirmed  Will add last 4 CBG results once components are confirmed  Assessment/Plan: S/P Procedure(s) (LRB): THORACOTOMY MAJOR (Left) EPICARDIAL PACING LEAD PLACEMENT (N/A)  1. Doing well, wean O2 , push pulm toilet/rehab 2 mental status appears to have normalized 3 will need cardiol f/u for device management   LOS: 4 days    GOLD,WAYNE E 7/28/20138:49 AM    I have seen and examined the patient and agree with the assessment and plan as outlined.  Patient is currently sinus rhythm with V pacing.  Kem Hensen H 10/25/2011 12:02 PM

## 2011-10-25 NOTE — Progress Notes (Signed)
Patient ambulated 350 ft with 1 assist, O2 at 2L , wheelchair for support.

## 2011-10-26 DIAGNOSIS — I359 Nonrheumatic aortic valve disorder, unspecified: Secondary | ICD-10-CM

## 2011-10-26 MED ORDER — GUAIFENESIN ER 600 MG PO TB12
600.0000 mg | ORAL_TABLET | Freq: Two times a day (BID) | ORAL | Status: DC
  Administered 2011-10-26 – 2011-10-28 (×5): 600 mg via ORAL
  Filled 2011-10-26 (×6): qty 1

## 2011-10-26 MED ORDER — DIGOXIN 125 MCG PO TABS
0.1250 mg | ORAL_TABLET | Freq: Every day | ORAL | Status: DC
  Administered 2011-10-26 – 2011-10-28 (×3): 0.125 mg via ORAL
  Filled 2011-10-26 (×3): qty 1

## 2011-10-26 MED ORDER — POTASSIUM CHLORIDE CRYS ER 20 MEQ PO TBCR: 20.0000 meq | EXTENDED_RELEASE_TABLET | Freq: Every day | ORAL | Status: DC

## 2011-10-26 MED ORDER — FUROSEMIDE 20 MG PO TABS
20.0000 mg | ORAL_TABLET | Freq: Every day | ORAL | Status: DC
  Administered 2011-10-26 – 2011-10-28 (×3): 20 mg via ORAL
  Filled 2011-10-26 (×3): qty 1

## 2011-10-26 NOTE — Progress Notes (Addendum)
                    [address]            [place] [address]          [phone]     5 Days Post-Op Procedure(s) (LRB): THORACOTOMY MAJOR (Left) EPICARDIAL PACING LEAD PLACEMENT (N/A)  Subjective: Feels well, no complaints. Walked 550 ft without difficulty.  Still on 2L O2, and having some nonproductive cough.  Objective: Vital signs in last 24 hours: Patient Vitals for the past 24 hrs:  BP Temp Temp src Pulse Resp SpO2  10/26/11 0801 - - - - - 94 %  10/26/11 0414 97/55 mmHg - - - - -  10/26/11 0408 83/53 mmHg 98.4 F (36.9 C) - 94  18  94 %  10/26/11 0232 - - - - - 91 %  10/25/11 2025 - - - - - 90 %  10/25/11 2015 100/50 mmHg 98.2 F (36.8 C) - 90  18  94 %  10/25/11 1804 92/56 mmHg - - 85  - -  10/25/11 1557 - - - - - 92 %  10/25/11 1508 86/58 mmHg 98.2 F (36.8 C) Oral 82  16  90 %   Current Weight  10/24/11 154 lb 5.2 oz (70 kg)     Intake/Output from previous day: 07/28 0701 - 07/29 0700 In: -  Out: 200 [Urine:200]    PHYSICAL EXAM:  Heart: RRR, Vpaced Lungs: clear Wound: clean and dry Extremities: no significant edema    Lab Results: CBC: Basename 10/25/11 0540  WBC 11.6*  HGB 12.8*  HCT 37.1*  PLT 213   BMET:  Basename 10/25/11 0540  NA 138  K 3.4*  CL 99  CO2 29  GLUCOSE 105*  BUN 16  CREATININE 1.07  CALCIUM 9.3    PT/INR: No results found for this basename: LABPROT,INR in the last 72 hours    Assessment/Plan: S/P Procedure(s) (LRB): THORACOTOMY MAJOR (Left) EPICARDIAL PACING LEAD PLACEMENT (N/A) CV- Appreciate EP's assistance.  Pacer to be re-interrogated.  Continue current care. Pulm- still requiring 2L.  Continue pulm toilet,will add Mucinex for sputum. CRPI. Hypokalemia- replace K+.  LOS: 5 days    COLLINS,GINA H 10/26/2011  Pacer reinterrogated. Functioning well.

## 2011-10-26 NOTE — Progress Notes (Signed)
Pt took 2nd walk late because he was nauseated this evening/sitting on toilet a lot.  Offered nausea medicine but wanted to wait to see how he felt after having bm.  Nausea went away after 2nd bm, but too weak to walk.  Walked circle without o2- ra 92-93.  Left o2 off.

## 2011-10-26 NOTE — Progress Notes (Addendum)
Patient ID: Marvin Bell, male   DOB: 1945/06/26, 66 y.o.   MRN: [id number]    SUBJECTIVE: Doing well overall.  Has had some dyspnea but walked up and down hall today.   CRT-D device interrogated today, it is functioning appropriately.      [name] aspirin EC  81 mg Oral Daily  . atorvastatin  10 mg Oral q1800  . bisacodyl  10 mg Oral Daily  . carvedilol  6.25 mg Oral BID WC  . digoxin  0.125 mg Oral Daily  . furosemide  20 mg Oral Daily  . furosemide  40 mg Oral Daily  . guaiFENesin  600 mg Oral BID  . levalbuterol  0.63 mg Nebulization Q6H  . pantoprazole  40 mg Oral Q1200  . potassium chloride  20 mEq Oral Daily  . potassium chloride  20 mEq Oral Daily  . potassium chloride  20 mEq Oral Daily  . ramipril  5 mg Oral q morning - 10a  . spironolactone  25 mg Oral Daily  . venlafaxine XR  75 mg Oral BID  . DISCONTD: potassium chloride  20 mEq Oral Daily      Filed Vitals:   10/26/11 0232 10/26/11 0408 10/26/11 0414 10/26/11 0801  BP:  83/53 97/55   Pulse:  94    Temp:  98.4 F (36.9 C)    TempSrc:      Resp:  18    Height:      Weight:      SpO2: 91% 94%  94%    Intake/Output Summary (Last 24 hours) at 10/26/11 1351 Last data filed at 10/26/11 0300  Gross per 24 hour  Intake      0 ml  Output    200 ml  Net   -200 ml    LABS: Basic Metabolic Panel:  Basename 10/25/11 0540  NA 138  K 3.4*  CL 99  CO2 29  GLUCOSE 105*  BUN 16  CREATININE 1.07  CALCIUM 9.3  MG --  PHOS --   Liver Function Tests: No results found for this basename: AST:2,ALT:2,ALKPHOS:2,BILITOT:2,PROT:2,ALBUMIN:2 in the last 72 hours No results found for this basename: LIPASE:2,AMYLASE:2 in the last 72 hours CBC:  Basename 10/25/11 0540  WBC 11.6*  NEUTROABS --  HGB 12.8*  HCT 37.1*  MCV 91.6  PLT 213   Cardiac Enzymes: No results found for this basename: CKTOTAL:3,CKMB:3,CKMBINDEX:3,TROPONINI:3 in the last 72 hours BNP: No components found with this basename:  POCBNP:3 D-Dimer: No results found for this basename: DDIMER:2 in the last 72 hours Hemoglobin A1C: No results found for this basename: HGBA1C in the last 72 hours Fasting Lipid Panel: No results found for this basename: CHOL,HDL,LDLCALC,TRIG,CHOLHDL,LDLDIRECT in the last 72 hours Thyroid Function Tests: No results found for this basename: TSH,T4TOTAL,FREET3,T3FREE,THYROIDAB in the last 72 hours Anemia Panel: No results found for this basename: VITAMINB12,FOLATE,FERRITIN,TIBC,IRON,RETICCTPCT in the last 72 hours   PHYSICAL EXAM General: NAD Neck: JVP 8 cm, no thyromegaly or thyroid nodule.  Lungs: Clear to auscultation bilaterally with normal respiratory effort. CV: Nondisplaced PMI.  Heart regular S1/S2, no S3/S4, no murmur.  No peripheral edema.  No carotid bruit.  Normal pedal pulses.  Abdomen: Soft, nontender, no hepatosplenomegaly, no distention.  Neurologic: Alert and oriented x 3.  Psych: Normal affect. Extremities: No clubbing or cyanosis.   TELEMETRY: Reviewed telemetry pt in a-sensed, BiV-paced  ASSESSMENT AND PLAN: 66 yo with history of primarily nonischemic cardiomyopathy and AS s/p AVR now admitted for placement of epicardial  LV lead.  I suspect he is mildly volume overloaded.  The LV lead is functioning appropriately (device interrogated this morning).  - Add lasix 20 mg po qd, replace K - Restart digoxin 0.125 mg daily (on at home).  - Continue other cardiac meds - Continue PT  [name] 10/26/2011 1:54 PM

## 2011-10-26 NOTE — Progress Notes (Signed)
CARDIAC REHAB PHASE I   PRE:  Rate/Rhythm: paced 88  BP:  Supine: 99/49  Sitting:   Standing:    SaO2: 92%2L  MODE:  Ambulation: 550 ft   POST:  Rate/Rhythem: 109 paced  BP:  Supine:   Sitting: 113/69  Standing:    SaO2: 94%2L 0845-0928 Pt walked 550 ft on 2L with rolling walker and asst x 1. Did not stop to rest. Got a little fast with rolling walker at end of walk. Encouraged pt to stay close to walker. Had tendency to get walker too far out. To recliner with call bell after walk. Sitter in room. Will attempt walking without oxygen next visit.  [name]

## 2011-10-27 LAB — BASIC METABOLIC PANEL
Chloride: 97 mEq/L (ref 96–112)
GFR calc Af Amer: 68 mL/min — ABNORMAL LOW (ref >90)
Potassium: 3.2 mEq/L — ABNORMAL LOW (ref 3.5–5.1)

## 2011-10-27 MED ORDER — DIGOXIN 125 MCG PO TABS: 0.1250 mg | ORAL_TABLET | Freq: Every day | ORAL | Status: DC

## 2011-10-27 MED ORDER — POTASSIUM CHLORIDE CRYS ER 20 MEQ PO TBCR
40.0000 meq | EXTENDED_RELEASE_TABLET | Freq: Once | ORAL | Status: AC
  Administered 2011-10-27: 40 meq via ORAL
  Filled 2011-10-27: qty 2

## 2011-10-27 MED ORDER — TRAMADOL HCL 50 MG PO TABS: 50.0000 mg | ORAL_TABLET | Freq: Four times a day (QID) | ORAL | Status: AC | PRN

## 2011-10-27 MED ORDER — POTASSIUM CHLORIDE CRYS ER 20 MEQ PO TBCR
20.0000 meq | EXTENDED_RELEASE_TABLET | Freq: Two times a day (BID) | ORAL | Status: DC
  Administered 2011-10-27 – 2011-10-28 (×2): 20 meq via ORAL
  Filled 2011-10-27 (×3): qty 1

## 2011-10-27 NOTE — Progress Notes (Signed)
Patient ID: Marvin Bell, male   DOB: [birth year], 66 y.o.   MRN: [id number]     SUBJECTIVE: Breathing better today.  Had Lasix yesterday.     [name] aspirin EC  81 mg Oral Daily  . atorvastatin  10 mg Oral q1800  . bisacodyl  10 mg Oral Daily  . carvedilol  6.25 mg Oral BID WC  . digoxin  0.125 mg Oral Daily  . furosemide  20 mg Oral Daily  . guaiFENesin  600 mg Oral BID  . levalbuterol  0.63 mg Nebulization Q6H  . pantoprazole  40 mg Oral Q1200  . potassium chloride  20 mEq Oral BID  . potassium chloride  40 mEq Oral Once  . ramipril  5 mg Oral q morning - 10a  . spironolactone  25 mg Oral Daily  . venlafaxine XR  75 mg Oral BID  . DISCONTD: potassium chloride  20 mEq Oral Daily  . DISCONTD: potassium chloride  20 mEq Oral Daily  . DISCONTD: potassium chloride  20 mEq Oral Daily      Filed Vitals:   10/27/11 0214 10/27/11 0435 10/27/11 0812 10/27/11 1009  BP:  95/61  94/41  Pulse: 74 96    Temp: 98.6 F (37 C) 99 F (37.2 C)    TempSrc: Oral Oral    Resp: 16 18    Height:      Weight:      SpO2: 87% 93% 92%     Intake/Output Summary (Last 24 hours) at 10/27/11 1104 Last data filed at 10/27/11 0440  Gross per 24 hour  Intake    440 ml  Output    400 ml  Net     40 ml    LABS: Basic Metabolic Panel:  Basename 10/27/11 0535 10/25/11 0540  NA 136 138  K 3.2* 3.4*  CL 97 99  CO2 26 29  GLUCOSE 98 105*  BUN 21 16  CREATININE 1.24 1.07  CALCIUM 9.0 9.3  MG -- --  PHOS -- --   Liver Function Tests: No results found for this basename: AST:2,ALT:2,ALKPHOS:2,BILITOT:2,PROT:2,ALBUMIN:2 in the last 72 hours No results found for this basename: LIPASE:2,AMYLASE:2 in the last 72 hours CBC:  Basename 10/25/11 0540  WBC 11.6*  NEUTROABS --  HGB 12.8*  HCT 37.1*  MCV 91.6  PLT 213   Cardiac Enzymes: No results found for this basename: CKTOTAL:3,CKMB:3,CKMBINDEX:3,TROPONINI:3 in the last 72 hours BNP: No components found with this basename:  POCBNP:3 D-Dimer: No results found for this basename: DDIMER:2 in the last 72 hours Hemoglobin A1C: No results found for this basename: HGBA1C in the last 72 hours Fasting Lipid Panel: No results found for this basename: CHOL,HDL,LDLCALC,TRIG,CHOLHDL,LDLDIRECT in the last 72 hours Thyroid Function Tests: No results found for this basename: TSH,T4TOTAL,FREET3,T3FREE,THYROIDAB in the last 72 hours Anemia Panel: No results found for this basename: VITAMINB12,FOLATE,FERRITIN,TIBC,IRON,RETICCTPCT in the last 72 hours   PHYSICAL EXAM General: NAD Neck: JVP 7 cm, no thyromegaly or thyroid nodule.  Lungs: Clear to auscultation bilaterally with normal respiratory effort. CV: Nondisplaced PMI.  Heart regular S1/S2, no S3/S4, no murmur.  No peripheral edema.  No carotid bruit.  Normal pedal pulses.  Abdomen: Soft, nontender, no hepatosplenomegaly, no distention.  Neurologic: Alert and oriented x 3.  Psych: Normal affect. Extremities: No clubbing or cyanosis.   TELEMETRY: Reviewed telemetry pt a-sensed, BiV-paced  ASSESSMENT AND PLAN: 66 yo with history of primarily nonischemic cardiomyopathy and AS s/p AVR now admitted for placement of epicardial LV  lead.  The LV lead is functioning appropriately (device interrogated yesterday).  Breathing better today after Lasix, does not actually look significant volume overloaded.  Marvin Bell give a total of 5 days Lasix then stop.  May not need long-term (has not in the past).  - Continue other cardiac meds - Continue PT - Followup with me 10-14 days post-discharge.   Marvin Bell 10/27/2011 11:04 AM

## 2011-10-27 NOTE — Progress Notes (Signed)
Pt walked 550 ft with a rolling walker and assist x 1 on Room air. Pt tolerated well. Pt assisted back to room and back in in bed. Call bell within reach.

## 2011-10-27 NOTE — Progress Notes (Signed)
CARDIAC REHAB PHASE I   PRE:  Rate/Rhythm: 95paced   BP:  Supine: 76/33 left   Sitting: 76/63 left, 89/61 right  Standing:    SaO2: 96%2L  MODE:  Ambulation: 750 ft   POST:  Rate/Rhythem: 119paced  BP:  Supine:   Sitting: 101/58 right arm  Standing:    SaO2: 92-94%RA 0740-0800 Pt's BP low this am. Better in right arm. Denied dizziness when up. Walked 750 ft on RA with rollator. Has tendency to get walker out a little too far and does not stay close to it in turns but tolerated well. To recliner with call bell after walk. Set up breakfast. Notified RN keeping off of oxygen. Discussed smoking cessation. Pt states he doe not plan to smoke any more.  [name]
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
## 2011-10-27 NOTE — Progress Notes (Addendum)
6 Days Post-Op Procedure(s) (LRB): THORACOTOMY MAJOR (Left) EPICARDIAL PACING LEAD PLACEMENT (N/A)  Subjective: Marvin Bell has no complaints this morning.  He states he is feeling better.  He does have some hypotension this morning with SBPs of 70s.  Objective: Vital signs in last 24 hours: Temp:  [97.5 F (36.4 C)-99 F (37.2 C)] 99 F (37.2 C) (07/30 0435) Pulse Rate:  [70-96] 96  (07/30 0435) Cardiac Rhythm:  [-] Ventricular paced (07/29 2010) Resp:  [16-18] 18  (07/30 0435) BP: (91-96)/(46-61) 95/61 mmHg (07/30 0435) SpO2:  [87 %-96 %] 92 % (07/30 0812)  Intake/Output from previous day: 07/29 0701 - 07/30 0700 In: 440 [P.O.:440] Out: 400 [Urine:400]  General appearance: alert, cooperative and no distress Heart: regular rate and rhythm Lungs: clear to auscultation bilaterally Abdomen: soft, non-tender; bowel sounds normal; no masses,  no organomegaly Extremities: edema trace Wound: clean and dry  Lab Results:  [facility] 10/25/11 0540  WBC 11.6*  HGB 12.8*  HCT 37.1*  PLT 213   BMET:  Basename 10/27/11 0535 10/25/11 0540  NA 136 138  K 3.2* 3.4*  CL 97 99  CO2 26 29  GLUCOSE 98 105*  BUN 21 16  CREATININE 1.24 1.07  CALCIUM 9.0 9.3    PT/INR: No results found for this basename: LABPROT,INR in the last 72 hours ABG    Component Value Date/Time   PHART 7.363 10/22/2011 1520   HCO3 24.9* 10/22/2011 1520   TCO2 24 10/22/2011 1530   ACIDBASEDEF 0.1 10/20/2011 1427   O2SAT 93.2 10/22/2011 1520   CBG (last 3)   Basename 10/24/11 2144  GLUCAP 113*    Assessment/Plan: S/P Procedure(s) (LRB): THORACOTOMY MAJOR (Left) EPICARDIAL PACING LEAD PLACEMENT (N/A)  1. CV- Pacer interrogated, functioning properly, continue current care 2. Pulm- weaned off oxygen yesterday 3. Hypokalemia- will replace K, recheck in AM 4. Renal- creatinine mildly elevated, will monitor 5. Dispo- patient progressing, will replace K and patient possibly ready for discharge in 24-48  hrs   LOS: 6 days    BARRETT, ERIN 10/27/2011   Plan d/c in am I have seen and examined Marvin Bell and agree with the above assessment  and plan.  Delight Ovens MD Beeper [phone] Office [phone] 10/27/2011 12:24 PM

## 2011-10-28 ENCOUNTER — Ambulatory Visit: Payer: Self-pay | Admitting: Cardiology

## 2011-10-28 ENCOUNTER — Other Ambulatory Visit: Payer: Self-pay

## 2011-10-28 LAB — BASIC METABOLIC PANEL
CO2: 25 mEq/L (ref 19–32)
Chloride: 100 mEq/L (ref 96–112)
Sodium: 137 mEq/L (ref 135–145)

## 2011-10-28 NOTE — Progress Notes (Signed)
7 Days Post-Op Procedure(s) (LRB): THORACOTOMY MAJOR (Left) EPICARDIAL PACING LEAD PLACEMENT (N/A)  Subjective: Mr. Voeltz has no complaints this morning.  He states he feels up to going home today.   Objective: Vital signs in last 24 hours: Temp:  [98 F (36.7 C)-99.1 F (37.3 C)] 99 F (37.2 C) (07/31 0419) Pulse Rate:  [84-94] 94  (07/31 0419) Cardiac Rhythm:  [-] Ventricular paced (07/30 2000) Resp:  [16-18] 16  (07/31 0419) BP: (82-94)/(41-58) 88/52 mmHg (07/31 0500) SpO2:  [90 %-97 %] 90 % (07/31 0734)  Intake/Output from previous day: 07/30 0701 - 07/31 0700 In: 240 [P.O.:240] Out: -   General appearance: alert, cooperative and no distress Neurologic: intact Heart: regular rate and rhythm Lungs: clear to auscultation bilaterally Abdomen: soft, non-tender; bowel sounds normal; no masses,  no organomegaly Extremities: edema none appreciated Wound: clean and dry  Lab Results: No results found for this basename: WBC:2,HGB:2,HCT:2,PLT:2 in the last 72 hours BMET:  [facility] 10/28/11 0550 10/27/11 0535  NA 137 136  K 4.0 3.2*  CL 100 97  CO2 25 26  GLUCOSE 97 98  BUN 17 21  CREATININE 1.16 1.24  CALCIUM 9.3 9.0    PT/INR: No results found for this basename: LABPROT,INR in the last 72 hours ABG    Component Value Date/Time   PHART 7.363 10/22/2011 1520   HCO3 24.9* 10/22/2011 1520   TCO2 24 10/22/2011 1530   ACIDBASEDEF 0.1 10/20/2011 1427   O2SAT 93.2 10/22/2011 1520   CBG (last 3)  No results found for this basename: GLUCAP:3 in the last 72 hours  Assessment/Plan: S/P Procedure(s) (LRB): THORACOTOMY MAJOR (Left) EPICARDIAL PACING LEAD PLACEMENT (N/A)  1. CV- V paced, on Coreg, Altace 2. Pulm- no acute issues, weaned off oxygen 3. Hypokalemia- resolved 4. Renal- creatinine improved 5. Dispo- patient doing well, will discharge home today   LOS: 7 days    Raford Pitcher, Trinidy Masterson 10/28/2011

## 2011-10-28 NOTE — Progress Notes (Signed)
[id number] Cardiac Rehab Completed discharge education with pt and wife. Pt agrees to Outpt. CRP in  [place], will send referral.

## 2011-10-28 NOTE — Progress Notes (Signed)
Suture removed L chest as ordered.  Steri strip placed over site.

## 2011-11-03 ENCOUNTER — Encounter: Payer: Self-pay | Admitting: Cardiology

## 2011-11-03 ENCOUNTER — Ambulatory Visit (INDEPENDENT_AMBULATORY_CARE_PROVIDER_SITE_OTHER): Payer: Medicare Other | Admitting: Cardiology

## 2011-11-03 VITALS — BP 101/60 | HR 81 | Ht 67.0 in | Wt 150.8 lb

## 2011-11-03 DIAGNOSIS — I350 Nonrheumatic aortic (valve) stenosis: Secondary | ICD-10-CM

## 2011-11-03 DIAGNOSIS — I5022 Chronic systolic (congestive) heart failure: Secondary | ICD-10-CM

## 2011-11-03 DIAGNOSIS — I359 Nonrheumatic aortic valve disorder, unspecified: Secondary | ICD-10-CM

## 2011-11-03 DIAGNOSIS — I714 Abdominal aortic aneurysm, without rupture: Secondary | ICD-10-CM

## 2011-11-03 DIAGNOSIS — F17200 Nicotine dependence, unspecified, uncomplicated: Secondary | ICD-10-CM

## 2011-11-03 DIAGNOSIS — I2510 Atherosclerotic heart disease of native coronary artery without angina pectoris: Secondary | ICD-10-CM

## 2011-11-03 MED ORDER — CARVEDILOL 6.25 MG PO TABS: ORAL_TABLET | ORAL | Status: DC

## 2011-11-03 NOTE — Patient Instructions (Addendum)
Increase coreg(carvedilol) to 9.375mg  twice a day. This will be one and one-half 6.25mg  tablets twice a day.  Your physician recommends that you return for lab work in: about 1 week--Digoxin level.   Dr Shirlee Latch has recommended you participate in Cardiac Rehab at [facility] in [place]. We will make the referral.   Your physician recommends that you schedule a follow-up appointment in: 1 month with Dr Shirlee Latch.

## 2011-11-04 DIAGNOSIS — I2510 Atherosclerotic heart disease of native coronary artery without angina pectoris: Secondary | ICD-10-CM | POA: Insufficient documentation

## 2011-11-04 NOTE — Assessment & Plan Note (Signed)
Bioprosthetic aortic valve appeared well-seated by the last echo.

## 2011-11-04 NOTE — Assessment & Plan Note (Signed)
Working on quitting.  I again advised him to stop.

## 2011-11-04 NOTE — Assessment & Plan Note (Signed)
Stable NYHA class III symptoms.  Weight is down.  - Continue current ramipril and spironolactone.  - Continue digoxin and check a level in 1 week.  - Increase Coreg to 9.375 mg bid.  - I will make sure he has followup with Dr. Graciela Husbands => is LV lead functioning appropriately? ECG with LBBB morphology.

## 2011-11-04 NOTE — Assessment & Plan Note (Signed)
Repeat abdominal US in 3/14.

## 2011-11-04 NOTE — Assessment & Plan Note (Signed)
No ischemic symptoms.  LDL at goal (< 70).
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
## 2011-11-09 ENCOUNTER — Other Ambulatory Visit: Payer: Medicare Other

## 2011-11-09 ENCOUNTER — Encounter: Payer: Medicare Other | Admitting: Cardiology

## 2011-11-10 ENCOUNTER — Encounter: Payer: Self-pay | Admitting: Internal Medicine

## 2011-11-10 ENCOUNTER — Ambulatory Visit (INDEPENDENT_AMBULATORY_CARE_PROVIDER_SITE_OTHER): Payer: Medicare Other | Admitting: Cardiology

## 2011-11-10 ENCOUNTER — Encounter: Payer: Self-pay | Admitting: Cardiology

## 2011-11-10 ENCOUNTER — Other Ambulatory Visit (INDEPENDENT_AMBULATORY_CARE_PROVIDER_SITE_OTHER): Payer: Medicare Other

## 2011-11-10 VITALS — BP 98/60 | HR 63 | Ht 67.0 in | Wt 150.6 lb

## 2011-11-10 DIAGNOSIS — I2510 Atherosclerotic heart disease of native coronary artery without angina pectoris: Secondary | ICD-10-CM

## 2011-11-10 DIAGNOSIS — I428 Other cardiomyopathies: Secondary | ICD-10-CM

## 2011-11-10 DIAGNOSIS — I429 Cardiomyopathy, unspecified: Secondary | ICD-10-CM

## 2011-11-10 DIAGNOSIS — I5022 Chronic systolic (congestive) heart failure: Secondary | ICD-10-CM

## 2011-11-10 DIAGNOSIS — Z95810 Presence of automatic (implantable) cardiac defibrillator: Secondary | ICD-10-CM

## 2011-11-10 DIAGNOSIS — R0989 Other specified symptoms and signs involving the circulatory and respiratory systems: Secondary | ICD-10-CM

## 2011-11-10 LAB — ICD DEVICE OBSERVATION
AL THRESHOLD: 1 V
BAMS-0001: 170 {beats}/min
BATTERY VOLTAGE: 3.18 V
LV LEAD IMPEDENCE ICD: 399 Ohm
RV LEAD IMPEDENCE ICD: 513 Ohm
TZAT-0001FASTVT: 1
TZAT-0002ATACH: NEGATIVE
TZAT-0004SLOWVT: 8
TZAT-0005SLOWVT: 88 pct
TZAT-0011SLOWVT: 10 ms
TZAT-0012ATACH: 150 ms
TZAT-0012SLOWVT: 170 ms
TZAT-0013SLOWVT: 3
TZAT-0018ATACH: NEGATIVE
TZAT-0018FASTVT: NEGATIVE
TZAT-0019ATACH: 6 V
TZAT-0020ATACH: 1.5 ms
TZAT-0020ATACH: 1.5 ms
TZAT-0020ATACH: 1.5 ms
TZON-0003SLOWVT: 300 ms
TZON-0003VSLOWVT: 450 ms
TZON-0004SLOWVT: 28
TZST-0001ATACH: 5
TZST-0001ATACH: 6
TZST-0001FASTVT: 2
TZST-0001FASTVT: 4
TZST-0001FASTVT: 6
TZST-0001SLOWVT: 3
TZST-0001SLOWVT: 5
TZST-0002ATACH: NEGATIVE
TZST-0002ATACH: NEGATIVE
TZST-0002FASTVT: NEGATIVE
TZST-0002FASTVT: NEGATIVE
TZST-0003SLOWVT: 35 J
TZST-0003SLOWVT: 35 J
VENTRICULAR PACING ICD: 99.9 pct

## 2011-11-10 NOTE — Patient Instructions (Addendum)
Keep follow up as scheduled with Dr Shirlee Latch.  If you do not feel that you are better before that visit, call and we will schedule for AV Optimization echo.

## 2011-11-11 ENCOUNTER — Other Ambulatory Visit: Payer: Medicare Other

## 2011-11-11 LAB — DIGOXIN LEVEL: Digoxin Level: 0.9 ng/mL (ref 0.8–2.0)

## 2011-11-12 ENCOUNTER — Other Ambulatory Visit: Payer: Self-pay | Admitting: Surgery

## 2011-11-12 DIAGNOSIS — I428 Other cardiomyopathies: Secondary | ICD-10-CM
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
## 2011-11-17 ENCOUNTER — Ambulatory Visit: Payer: Medicare Other | Admitting: Surgery

## 2011-11-17 ENCOUNTER — Ambulatory Visit
Admission: RE | Admit: 2011-11-17 | Discharge: 2011-11-17 | Disposition: A | Discharge status: Home / Self Care | Payer: Medicare Other | Source: Ambulatory Visit | Attending: Surgery | Admitting: Surgery

## 2011-11-17 ENCOUNTER — Encounter: Payer: Self-pay | Admitting: Surgery

## 2011-11-17 ENCOUNTER — Ambulatory Visit (INDEPENDENT_AMBULATORY_CARE_PROVIDER_SITE_OTHER): Payer: Self-pay | Admitting: Surgery

## 2011-11-17 VITALS — BP 105/58 | HR 53 | Resp 16 | Ht 66.0 in | Wt 153.5 lb

## 2011-11-17 DIAGNOSIS — Z09 Encounter for follow-up examination after completed treatment for conditions other than malignant neoplasm: Secondary | ICD-10-CM

## 2011-11-17 DIAGNOSIS — I428 Other cardiomyopathies: Secondary | ICD-10-CM

## 2011-11-17 NOTE — Progress Notes (Signed)
[address]            [place] [address]          [phone]     HPI:  Patient returns for routine postoperative follow-up having undergone left thoracotomy and insertion of two epicardial left ventricular pacing leads with revision of his biventricular pacemaker/defibrillator system on 10/21/2011. The patient's early postoperative recovery while in the hospital was notable for an uncomplicated postoperative course. Since hospital discharge the patient reports that he is been feeling well. He said his breathing has significantly improved since having the left ventricular leads placed. He is continuing to smoke.   Current Outpatient Prescriptions  Medication Sig Dispense Refill  . albuterol (PROVENTIL HFA;VENTOLIN HFA) 108 (90 BASE) MCG/ACT inhaler Inhale 2 puffs into the lungs every 6 (six) hours as needed. For wheezing.      [name] aspirin EC 81 MG tablet Take 81 mg by mouth daily.      . carvedilol (COREG) 6.25 MG tablet 1 and 1/2 tablets (total 9.375mg ) twice a day  270 tablet  3  . Coenzyme Q10 50 MG CAPS Take 50 mg by mouth daily.      . diazepam (VALIUM) 10 MG tablet Take 10 mg by mouth 3 (three) times daily as needed. For anxiety.      . digoxin (LANOXIN) 0.125 MG tablet Take 1 tablet (0.125 mg total) by mouth daily.  30 tablet  1  . HYDROcodone-acetaminophen (LORTAB) 7.5-500 MG per tablet Take 1-2 tablets by mouth every 6 (six) hours as needed. For back and shoulder pain.      [name] omeprazole (PRILOSEC) 20 MG capsule Take 20 mg by mouth 2 (two) times daily.      . ramipril (ALTACE) 5 MG capsule Take 5 mg by mouth every morning.      . rosuvastatin (CRESTOR) 5 MG tablet Take 5 mg by mouth at bedtime.      [name] spironolactone (ALDACTONE) 25 MG tablet Take 25 mg by mouth daily.      . traZODone (DESYREL) 50 MG tablet Take 50 mg by mouth at bedtime as needed. For insomnia.      [name] venlafaxine XR (EFFEXOR-XR) 75 MG 24 hr capsule Take 75 mg by mouth 2 (two)  times daily.      [name] zolpidem (AMBIEN) 10 MG tablet Take 10 mg by mouth at bedtime as needed. For insomnia.      [name] DISCONTD: roflumilast (DALIRESP) 500 MCG TABS tablet Take 1 tablet (500 mcg total) by mouth daily.  30 tablet  6    Physical Exam: BP 105/58  Pulse 53  Resp 16  Ht 5\' 6"  (1.676 m)  Wt 153 lb 8 oz (69.627 kg)  BMI 24.78 kg/m2  SpO2 98% He looks well. He smells like cigarettes. Cardiac exam shows a regular rate and rhythm with normal heart sounds. Lung exam is clear. The left thoracotomy incision is healing well. The pacemaker/defibrillator incision is healing well.  Diagnostic Tests:  *RADIOLOGY REPORT*   Clinical Data: Pacemaker placement, heart surgery 1 week ago, chest pain, smoker, COPD   CHEST - 2 VIEW   Comparison: 10/25/2011   Findings: Normal heart size post median sternotomy. Epicardial pacing wires present. Left subclavian transvenous AICD leads project at right atrium and right ventricle. Mediastinal contours and pulmonary vascularity normal. Lungs clear. No pleural effusion or pneumothorax. No acute osseous findings.  IMPRESSION: No acute abnormalities.     Original Report Authenticated By: Lollie Marrow, M.D.     Impression:  He is doing well following his surgery. He is symptomatically improved.  Plan:  He'll continue followup with Dr. Shirlee Latch and will contact me if he develops any problems with his incisions.

## 2011-11-18 ENCOUNTER — Telehealth: Payer: Self-pay | Admitting: *Deleted

## 2011-11-18 NOTE — Telephone Encounter (Signed)
Called patient to follow up on device programming changes made at last office visit.  Patient states he is feeling better since changes were made.  He does not wish to pursue AV optimization at this time.  He will keep appt with Dr Shirlee Latch on 9-10 and follow up with his device as scheduled.

## 2011-12-08 ENCOUNTER — Encounter: Payer: Self-pay | Admitting: Cardiology

## 2011-12-08 ENCOUNTER — Ambulatory Visit (INDEPENDENT_AMBULATORY_CARE_PROVIDER_SITE_OTHER): Payer: Medicare Other | Admitting: Cardiology

## 2011-12-08 VITALS — BP 116/62 | HR 81 | Ht 66.0 in | Wt 150.0 lb

## 2011-12-08 DIAGNOSIS — I5022 Chronic systolic (congestive) heart failure: Secondary | ICD-10-CM

## 2011-12-08 DIAGNOSIS — I2510 Atherosclerotic heart disease of native coronary artery without angina pectoris: Secondary | ICD-10-CM

## 2011-12-08 DIAGNOSIS — I714 Abdominal aortic aneurysm, without rupture, unspecified: Secondary | ICD-10-CM

## 2011-12-08 DIAGNOSIS — F329 Major depressive disorder, single episode, unspecified: Secondary | ICD-10-CM

## 2011-12-08 DIAGNOSIS — F3289 Other specified depressive episodes: Secondary | ICD-10-CM

## 2011-12-08 DIAGNOSIS — I359 Nonrheumatic aortic valve disorder, unspecified: Secondary | ICD-10-CM

## 2011-12-08 MED ORDER — TIOTROPIUM BROMIDE MONOHYDRATE 18 MCG IN CAPS: 18.0000 ug | ORAL_CAPSULE | Freq: Every day | RESPIRATORY_TRACT | Status: DC

## 2011-12-08 MED ORDER — CARVEDILOL 12.5 MG PO TABS: 12.5000 mg | ORAL_TABLET | Freq: Two times a day (BID) | ORAL | Status: DC

## 2011-12-08 NOTE — Patient Instructions (Addendum)
Increase coreg(carvedilol) to 12.5mg  two times a day. You can take two 6.25mg  tablets two times a day and use your current supply.  Use Spiriva Inhaler to help with your shortness of breath.  Your physician recommends that you have  lab work today--BMET/CBC/TSH  A chest x-ray takes a picture of the organs and structures inside the chest, including the heart, lungs, and blood vessels. This test can show several things, including, whether the heart is enlarges; whether fluid is building up in the lungs; and whether pacemaker / defibrillator leads are still in place.   Your physician has recommended that you have a pulmonary function test. Pulmonary Function Tests are a group of tests that measure how well air moves in and out of your lungs.  Your physician has recommended that you have a cardiopulmonary stress test (CPX). CPX testing is a non-invasive measurement of heart and lung function. It replaces a traditional treadmill stress test. This type of test provides a tremendous amount of information that relates not only to your present condition but also for future outcomes. This test combines measurements of you ventilation, respiratory gas exchange in the lungs, electrocardiogram (EKG), blood pressure and physical response before, during, and following an exercise protocol.   Call [facility] and start the Cardiac Rehab program.  Your physician recommends that you schedule a follow-up appointment in: 1 month with Dr Shirlee Latch.

## 2011-12-09 LAB — BASIC METABOLIC PANEL
BUN: 10 mg/dL (ref 6–23)
Calcium: 9.5 mg/dL (ref 8.4–10.5)
Creatinine, Ser: 1.2 mg/dL (ref 0.4–1.5)

## 2011-12-09 LAB — CBC WITH DIFFERENTIAL/PLATELET
Basophils Absolute: 0.1 10*3/uL (ref 0.0–0.1)
Eosinophils Absolute: 0.3 10*3/uL (ref 0.0–0.7)
HCT: 43.1 % (ref 39.0–52.0)
Hemoglobin: 14.4 g/dL (ref 13.0–17.0)
Lymphs Abs: 2.1 10*3/uL (ref 0.7–4.0)
MCHC: 33.4 g/dL (ref 30.0–36.0)
MCV: 94.5 fl (ref 78.0–100.0)
Monocytes Absolute: 1.1 10*3/uL — ABNORMAL HIGH (ref 0.1–1.0)
Neutro Abs: 8.2 10*3/uL — ABNORMAL HIGH (ref 1.4–7.7)
RDW: 13.5 % (ref 11.5–14.6)
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
## 2011-12-10 ENCOUNTER — Ambulatory Visit
Admission: RE | Admit: 2011-12-10 | Discharge: 2011-12-10 | Disposition: A | Discharge status: Home / Self Care | Payer: Medicare Other | Source: Ambulatory Visit | Attending: Cardiology | Admitting: Cardiology

## 2011-12-10 DIAGNOSIS — I5022 Chronic systolic (congestive) heart failure: Secondary | ICD-10-CM

## 2011-12-16 ENCOUNTER — Ambulatory Visit: Payer: Medicare Other | Admitting: Family Medicine

## 2011-12-21 ENCOUNTER — Encounter: Payer: Self-pay | Admitting: Family Medicine

## 2011-12-21 ENCOUNTER — Ambulatory Visit (INDEPENDENT_AMBULATORY_CARE_PROVIDER_SITE_OTHER): Payer: Medicare Other | Admitting: Family Medicine

## 2011-12-21 VITALS — BP 96/62 | HR 59 | Temp 98.4°F | Ht 67.0 in | Wt 151.0 lb

## 2011-12-21 DIAGNOSIS — M545 Low back pain: Secondary | ICD-10-CM

## 2011-12-21 DIAGNOSIS — R432 Parageusia: Secondary | ICD-10-CM

## 2011-12-21 DIAGNOSIS — Z23 Encounter for immunization: Secondary | ICD-10-CM

## 2011-12-21 DIAGNOSIS — R439 Unspecified disturbances of smell and taste: Secondary | ICD-10-CM

## 2011-12-21 NOTE — Assessment & Plan Note (Signed)
He tends to undertreat his pain. I told him it was ok to take 1 and 1/2 to 2 of the vicodin 7.5/500 tabs 1-2 times per day.   He says he'll try this in order to try to get better pain control.

## 2011-12-21 NOTE — Assessment & Plan Note (Addendum)
I believe this is from his lifetime of smoking. Encouraged him to quit completely. He is depressed, as well, but he is pretty set on continuing his current antidepressant and not changing or adding anything to this right now. He agrees that a large portion of his depression is situational. Other causes of taste dysfunction like med side effect could be contributing but the timing of things does not fit.
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
## 2011-12-22 ENCOUNTER — Ambulatory Visit (HOSPITAL_COMMUNITY): Payer: Medicare Other

## 2011-12-22 ENCOUNTER — Ambulatory Visit (HOSPITAL_COMMUNITY)
Admission: RE | Admit: 2011-12-22 | Discharge: 2011-12-22 | Disposition: A | Discharge status: Home / Self Care | Payer: Medicare Other | Source: Ambulatory Visit | Attending: Cardiology | Admitting: Cardiology

## 2011-12-22 DIAGNOSIS — I5020 Unspecified systolic (congestive) heart failure: Secondary | ICD-10-CM | POA: Insufficient documentation

## 2011-12-22 DIAGNOSIS — I5022 Chronic systolic (congestive) heart failure: Secondary | ICD-10-CM

## 2011-12-22 LAB — PULMONARY FUNCTION TEST

## 2011-12-22 MED ORDER — ALBUTEROL SULFATE (5 MG/ML) 0.5% IN NEBU
2.5000 mg | INHALATION_SOLUTION | Freq: Once | RESPIRATORY_TRACT | Status: AC
  Administered 2011-12-22: 2.5 mg via RESPIRATORY_TRACT

## 2011-12-25 ENCOUNTER — Other Ambulatory Visit: Payer: Self-pay | Admitting: Family Medicine

## 2012-01-01 ENCOUNTER — Other Ambulatory Visit: Payer: Self-pay | Admitting: Family Medicine

## 2012-01-01 MED ORDER — ALBUTEROL SULFATE HFA 108 (90 BASE) MCG/ACT IN AERS: 2.0000 | INHALATION_SPRAY | Freq: Four times a day (QID) | RESPIRATORY_TRACT | Status: DC | PRN

## 2012-01-01 NOTE — Telephone Encounter (Signed)
RX sent

## 2012-01-06 ENCOUNTER — Ambulatory Visit (HOSPITAL_COMMUNITY): Payer: Medicare Other

## 2012-01-13 ENCOUNTER — Ambulatory Visit (HOSPITAL_COMMUNITY): Payer: Medicare Other

## 2012-01-14 ENCOUNTER — Telehealth: Payer: Self-pay | Admitting: Cardiology

## 2012-01-14 NOTE — Telephone Encounter (Signed)
plz return call to pt [phone] regarding medical care

## 2012-01-14 NOTE — Telephone Encounter (Signed)
Pt. had CPX on 9-24 but due to a computer malfunction all information of test was lost so pt. was asked to repeat test. Pt. agreed to repeat CPX but has since decided not to due to chronic back pain. He states that the test was difficult and painful for him and does not want to repeat. Pt. Is scheduled to see Dr. Shirlee Latch on 10-22 and would like a call back concerning test before visit.

## 2012-01-15 NOTE — Telephone Encounter (Signed)
Reviewed with Dr Shirlee Latch. Pt does not have to repeat CPX test at this time. Pt states he had a significant amount of hip pain and that is the reason he had to stop the test.

## 2012-01-18 ENCOUNTER — Encounter: Payer: Self-pay | Admitting: Internal Medicine

## 2012-01-18 ENCOUNTER — Ambulatory Visit (INDEPENDENT_AMBULATORY_CARE_PROVIDER_SITE_OTHER): Payer: Medicare Other | Admitting: *Deleted

## 2012-01-18 DIAGNOSIS — I429 Cardiomyopathy, unspecified: Secondary | ICD-10-CM

## 2012-01-18 DIAGNOSIS — I5022 Chronic systolic (congestive) heart failure: Secondary | ICD-10-CM

## 2012-01-18 DIAGNOSIS — I428 Other cardiomyopathies: Secondary | ICD-10-CM

## 2012-01-19 ENCOUNTER — Encounter: Payer: Self-pay | Admitting: Cardiology

## 2012-01-19 ENCOUNTER — Ambulatory Visit (INDEPENDENT_AMBULATORY_CARE_PROVIDER_SITE_OTHER): Payer: Medicare Other | Admitting: Cardiology

## 2012-01-19 VITALS — BP 93/47 | HR 68 | Ht 66.0 in | Wt 152.0 lb

## 2012-01-19 DIAGNOSIS — I2510 Atherosclerotic heart disease of native coronary artery without angina pectoris: Secondary | ICD-10-CM

## 2012-01-19 DIAGNOSIS — I714 Abdominal aortic aneurysm, without rupture, unspecified: Secondary | ICD-10-CM

## 2012-01-19 DIAGNOSIS — I5022 Chronic systolic (congestive) heart failure: Secondary | ICD-10-CM

## 2012-01-19 DIAGNOSIS — IMO0001 Reserved for inherently not codable concepts without codable children: Secondary | ICD-10-CM

## 2012-01-19 DIAGNOSIS — E785 Hyperlipidemia, unspecified: Secondary | ICD-10-CM

## 2012-01-19 DIAGNOSIS — F17200 Nicotine dependence, unspecified, uncomplicated: Secondary | ICD-10-CM

## 2012-01-19 MED ORDER — TRAMADOL HCL 50 MG PO TABS: ORAL_TABLET | ORAL | Status: DC

## 2012-01-19 NOTE — Patient Instructions (Addendum)
Your physician recommends that you have  lab work today--BMET/Digoxin level  Use tramadol 50mg  two times a day as needed for pain.   I will call you tomorrow with instructions for the right heart catheterization. I am going to try and schedule it for Friday November 1,2013.   Your physician recommends that you schedule a follow-up appointment in: 4 weeks with Dr Shirlee Latch.

## 2012-01-20 ENCOUNTER — Encounter: Payer: Self-pay | Admitting: *Deleted

## 2012-01-20 LAB — BASIC METABOLIC PANEL
CO2: 28 mEq/L (ref 19–32)
Chloride: 104 mEq/L (ref 96–112)
Sodium: 137 mEq/L (ref 135–145)
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
## 2012-01-22 LAB — REMOTE ICD DEVICE
AL IMPEDENCE ICD: 513 Ohm
AL THRESHOLD: 1 V
BATTERY VOLTAGE: 3.1628 V
LV LEAD IMPEDENCE ICD: 456 Ohm
RV LEAD AMPLITUDE: 16.1 mv
TOT-0002: 0
TOT-0006: 20130410000000
TZAT-0001FASTVT: 1
TZAT-0001SLOWVT: 1
TZAT-0002ATACH: NEGATIVE
TZAT-0002ATACH: NEGATIVE
TZAT-0002FASTVT: NEGATIVE
TZAT-0004SLOWVT: 8
TZAT-0012ATACH: 150 ms
TZAT-0012ATACH: 150 ms
TZAT-0013SLOWVT: 3
TZAT-0018FASTVT: NEGATIVE
TZAT-0018SLOWVT: NEGATIVE
TZAT-0019ATACH: 6 V
TZAT-0019ATACH: 6 V
TZAT-0019ATACH: 6 V
TZAT-0019SLOWVT: 8 V
TZAT-0020ATACH: 1.5 ms
TZAT-0020ATACH: 1.5 ms
TZAT-0020ATACH: 1.5 ms
TZAT-0020SLOWVT: 1.5 ms
TZON-0003ATACH: 350 ms
TZON-0004SLOWVT: 28
TZON-0005SLOWVT: 12
TZST-0001ATACH: 4
TZST-0001FASTVT: 4
TZST-0001FASTVT: 6
TZST-0001SLOWVT: 2
TZST-0001SLOWVT: 4
TZST-0002ATACH: NEGATIVE
TZST-0002FASTVT: NEGATIVE
TZST-0002FASTVT: NEGATIVE
TZST-0002FASTVT: NEGATIVE
TZST-0003SLOWVT: 35 J
TZST-0003SLOWVT: 35 J
TZST-0003SLOWVT: 35 J

## 2012-01-27 ENCOUNTER — Encounter: Payer: Self-pay | Admitting: Family Medicine

## 2012-01-27 ENCOUNTER — Ambulatory Visit (INDEPENDENT_AMBULATORY_CARE_PROVIDER_SITE_OTHER): Payer: Medicare Other | Admitting: Family Medicine

## 2012-01-27 VITALS — BP 106/69 | HR 81 | Temp 98.4°F | Ht 67.0 in | Wt 150.0 lb

## 2012-01-27 DIAGNOSIS — M1990 Unspecified osteoarthritis, unspecified site: Secondary | ICD-10-CM

## 2012-01-28 ENCOUNTER — Other Ambulatory Visit: Payer: Self-pay | Admitting: Cardiology

## 2012-01-28 DIAGNOSIS — I509 Heart failure, unspecified: Secondary | ICD-10-CM

## 2012-01-29 ENCOUNTER — Encounter (HOSPITAL_BASED_OUTPATIENT_CLINIC_OR_DEPARTMENT_OTHER)
Admission: RE | Disposition: A | Discharge status: Home / Self Care | Payer: Self-pay | Source: Ambulatory Visit | Attending: Cardiology

## 2012-01-29 ENCOUNTER — Inpatient Hospital Stay (HOSPITAL_BASED_OUTPATIENT_CLINIC_OR_DEPARTMENT_OTHER)
Admission: RE | Admit: 2012-01-29 | Discharge: 2012-01-29 | Disposition: A | Discharge status: Home / Self Care | Payer: Medicare Other | Source: Ambulatory Visit | Attending: Cardiology | Admitting: Cardiology

## 2012-01-29 ENCOUNTER — Encounter: Payer: Self-pay | Admitting: Family Medicine

## 2012-01-29 DIAGNOSIS — I2582 Chronic total occlusion of coronary artery: Secondary | ICD-10-CM | POA: Insufficient documentation

## 2012-01-29 DIAGNOSIS — I2510 Atherosclerotic heart disease of native coronary artery without angina pectoris: Secondary | ICD-10-CM | POA: Insufficient documentation

## 2012-01-29 DIAGNOSIS — I714 Abdominal aortic aneurysm, without rupture, unspecified: Secondary | ICD-10-CM | POA: Insufficient documentation

## 2012-01-29 DIAGNOSIS — Z952 Presence of prosthetic heart valve: Secondary | ICD-10-CM | POA: Insufficient documentation

## 2012-01-29 DIAGNOSIS — I428 Other cardiomyopathies: Secondary | ICD-10-CM | POA: Insufficient documentation

## 2012-01-29 DIAGNOSIS — Z95810 Presence of automatic (implantable) cardiac defibrillator: Secondary | ICD-10-CM | POA: Insufficient documentation

## 2012-01-29 DIAGNOSIS — I5022 Chronic systolic (congestive) heart failure: Secondary | ICD-10-CM | POA: Insufficient documentation

## 2012-01-29 DIAGNOSIS — R0789 Other chest pain: Secondary | ICD-10-CM | POA: Insufficient documentation

## 2012-01-29 DIAGNOSIS — G8929 Other chronic pain: Secondary | ICD-10-CM | POA: Insufficient documentation

## 2012-01-29 DIAGNOSIS — J449 Chronic obstructive pulmonary disease, unspecified: Secondary | ICD-10-CM | POA: Insufficient documentation

## 2012-01-29 DIAGNOSIS — F17200 Nicotine dependence, unspecified, uncomplicated: Secondary | ICD-10-CM | POA: Insufficient documentation

## 2012-01-29 DIAGNOSIS — J4489 Other specified chronic obstructive pulmonary disease: Secondary | ICD-10-CM | POA: Insufficient documentation

## 2012-01-29 DIAGNOSIS — I509 Heart failure, unspecified: Secondary | ICD-10-CM

## 2012-01-29 LAB — POCT I-STAT 3, VENOUS BLOOD GAS (G3P V)
Acid-base deficit: 1 mmol/L (ref 0.0–2.0)
Bicarbonate: 24.3 mEq/L — ABNORMAL HIGH (ref 20.0–24.0)
O2 Saturation: 62 %
TCO2: 26 mmol/L (ref 0–100)
pH, Ven: 7.36 — ABNORMAL HIGH (ref 7.250–7.300)

## 2012-01-29 LAB — POCT I-STAT 3, ART BLOOD GAS (G3+)
Bicarbonate: 24.4 mEq/L — ABNORMAL HIGH (ref 20.0–24.0)
TCO2: 26 mmol/L (ref 0–100)
pCO2 arterial: 39.5 mmHg (ref 35.0–45.0)
pH, Arterial: 7.399 (ref 7.350–7.450)
pO2, Arterial: 59 mmHg — ABNORMAL LOW (ref 80.0–100.0)

## 2012-01-29 SURGERY — JV RIGHT HEART CATHETERIZATION
Anesthesia: Moderate Sedation

## 2012-01-29 MED ORDER — ONDANSETRON HCL 4 MG/2ML IJ SOLN: 4.0000 mg | Freq: Four times a day (QID) | INTRAMUSCULAR | Status: DC | PRN

## 2012-01-29 MED ORDER — SODIUM CHLORIDE 0.9 % IJ SOLN: 3.0000 mL | INTRAMUSCULAR | Status: DC | PRN

## 2012-01-29 MED ORDER — SODIUM CHLORIDE 0.9 % IJ SOLN: 3.0000 mL | Freq: Two times a day (BID) | INTRAMUSCULAR | Status: DC

## 2012-01-29 MED ORDER — FENTANYL CITRATE 0.05 MG/ML IJ SOLN
25.0000 ug | Freq: Once | INTRAMUSCULAR | Status: AC
  Administered 2012-01-29: 25 ug via INTRAVENOUS

## 2012-01-29 MED ORDER — SODIUM CHLORIDE 0.9 % IV SOLN: INTRAVENOUS | Status: AC

## 2012-01-29 MED ORDER — ASPIRIN 81 MG PO CHEW: 324.0000 mg | CHEWABLE_TABLET | ORAL | Status: DC

## 2012-01-29 MED ORDER — ACETAMINOPHEN 325 MG PO TABS: 650.0000 mg | ORAL_TABLET | ORAL | Status: DC | PRN

## 2012-01-29 MED ORDER — SODIUM CHLORIDE 0.9 % IV SOLN
250.0000 mL | INTRAVENOUS | Status: DC | PRN
  Administered 2012-01-29: 250 mL via INTRAVENOUS

## 2012-01-29 NOTE — Interval H&P Note (Signed)
History and Physical Interval Note:  01/29/2012 11:20 AM  Marvin Bell  has presented today for surgery, with the diagnosis of SOB  The various methods of treatment have been discussed with the patient and family. After consideration of risks, benefits and other options for treatment, the patient has consented to  Procedure(s) (LRB) with comments: JV RIGHT HEART CATHETERIZATION (N/A) and left heart catheterization as a surgical intervention .    Additional history was obtained today.  Patient reports that he has been having chest tightness with exertion and sometimes waking him up at night.  This has been going on for more than a month.  He has not told me about this in the past.  He did have an occluded OM3 on LHC prior to valve surgery.  As we had planned to do a right heart cath today, we will add a left heart cath.  No LV-gram.   The patient's history has been reviewed, patient examined, no change in status, stable for surgery.  I have reviewed the patient's chart and labs.  Questions were answered to the patient's satisfaction.     Marvin Bell [employer]

## 2012-01-29 NOTE — OR Nursing (Signed)
Bleeding occurred after deflation, TR band reinflated

## 2012-01-29 NOTE — CV Procedure (Signed)
   Cardiac Catheterization Procedure Note  Name: TYTUS STRAHLE MRN: [id number] DOB: 18-Feb-1946  Procedure: Right Heart Cath, Left Heart Cath, Selective Coronary Angiography  Indication:  Exertional chest pain, dyspnea/fatigue, cardiomyopathy.    Procedural Details: The right groin was prepped, draped, and anesthetized with 1% lidocaine. Using the modified Seldinger technique a 4 French sheath was placed in the right femoral artery and a 7 French sheath was placed in the right femoral vein. A Swan-Ganz catheter was used for the right heart catheterization. Standard protocol was followed for recording of right heart pressures and sampling of oxygen saturations. Fick cardiac output was calculated. Left heart catheterization was next attempted.  I was unable to navigate the wire beyond the patient's saccular infrarenal aortic aneurysm.  Therefore, the right wrist was prepped for access. Allen's test was positive.  The right radial artery was engaged using modified Seldinger technique and a 48F catheter was placed.  Verapamil 3 mg IV and heparin 4000 units IV were given.  The LCA was engaged with the JL3.5 catheter.  The nondominant right could not be cannulated directly due to position of the struts from the prosthetic aortic valve.  Therefore, aortic root shot was done to visualized the RCA.   Procedural Findings: Hemodynamics (mmHg) RA 3 RV 38/5 PA 33/12, mean 20 PCWP 5  Oxygen saturations: PA 62% AO 90%  Cardiac Output (Fick) 4.1  Cardiac Index (Fick) 2.3  Cardiac Output (Thermo) 4.5 Cardiac Output (Fick) 2.5   Coronary angiography: Coronary dominance: left  Left mainstem: Short, no significant disease.   Left anterior descending (LAD): The LAD itself was not a large vessel.  The 1st and 2nd diagonals were comparable in size to the LAD and covered a significant territory.  There were mild luminal irregularities.   Left circumflex (LCx): The LCx was a large dominant vessel with  minimal disease.  There was a high OM1 with 30% ostial stenosis. On prior cath, an occluded OM3 was noted.  I do not see this today.  There was minimal disease in the multiple OMs.   Right coronary artery (RCA): Small, nondominant, minimal disease.   Left ventriculography: Left ventricular systolic function is normal, LVEF is estimated at 55-65%, there is no significant mitral regurgitation   Final Conclusions:  Filling pressures are not elevated.  Cardiac index is low but is much improved from RHC prior to AVR.  No significant coronary disease to explain chest pain.  Suspect noncardiac chest pain.   Recommendations: Continue current medication regimen.  Make sure to take 12.5 mg bid of Coreg.    [name] 01/29/2012, 12:38 PM
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
## 2012-02-01 ENCOUNTER — Other Ambulatory Visit: Payer: Self-pay | Admitting: *Deleted

## 2012-02-01 DIAGNOSIS — J449 Chronic obstructive pulmonary disease, unspecified: Secondary | ICD-10-CM

## 2012-02-01 MED ORDER — ALBUTEROL SULFATE (2.5 MG/3ML) 0.083% IN NEBU: 2.5000 mg | INHALATION_SOLUTION | RESPIRATORY_TRACT | Status: DC | PRN

## 2012-02-01 NOTE — Addendum Note (Signed)
Addended by: Luisa Dago on: 02/01/2012 02:42 PM   Modules accepted: Orders

## 2012-02-01 NOTE — Telephone Encounter (Signed)
RX previously printed.  Resent "Normal".

## 2012-02-01 NOTE — Telephone Encounter (Signed)
Faxed refill request received from pharmacy for DIAZEPAM 10 mg Last filled by MD on ???.  No record found in EPIC.  Last filled at pharmacy on 11/16/11, #90   Faxed refill request received from pharmacy for [facility] Last filled by MD on 03/06/11, #30 X 5  Faxed refill request received from pharmacy for HYDROCODONE Last filled by MD on 08/18/11, #75 X 3  Last office visit was 12/21/11.  Pt had appt on 10/3, but had to leave for another doctor's appt prior to being seen.  Pt has follow up scheduled for 02/03/12.  Please advise refills.

## 2012-02-01 NOTE — Addendum Note (Signed)
Addended by: Luisa Dago on: 02/01/2012 06:28 PM   Modules accepted: Orders

## 2012-02-01 NOTE — Telephone Encounter (Signed)
Pt request refill on albuterol for nebulizer.  RX sent.

## 2012-02-03 ENCOUNTER — Ambulatory Visit: Payer: Medicare Other | Admitting: Family Medicine

## 2012-02-05 NOTE — Telephone Encounter (Signed)
Please advise for AMBIEN, HYDROCODONE AND DIAZEPAM. THANKS!!!

## 2012-02-07 ENCOUNTER — Other Ambulatory Visit: Payer: Self-pay | Admitting: Family Medicine

## 2012-02-07 MED ORDER — DIAZEPAM 10 MG PO TABS: 10.0000 mg | ORAL_TABLET | Freq: Three times a day (TID) | ORAL | Status: DC | PRN

## 2012-02-07 MED ORDER — HYDROCODONE-ACETAMINOPHEN 7.5-500 MG PO TABS: 1.0000 | ORAL_TABLET | Freq: Four times a day (QID) | ORAL | Status: DC | PRN

## 2012-02-07 MED ORDER — ZOLPIDEM TARTRATE 5 MG PO TABS: 5.0000 mg | ORAL_TABLET | Freq: Every evening | ORAL | Status: DC | PRN

## 2012-02-07 NOTE — Telephone Encounter (Signed)
Rx's done, but notify pt that I had to change his ambien dose from 10mg  to 5mg  due to his age: anyone over age 66 CANNOT get more than 5mg  dosing of this--new FDA rules.

## 2012-02-08 NOTE — Telephone Encounter (Signed)
Faxed to pharmacy

## 2012-02-22 ENCOUNTER — Encounter: Payer: Self-pay | Admitting: *Deleted

## 2012-02-26 ENCOUNTER — Encounter: Payer: Self-pay | Admitting: *Deleted

## 2012-02-29 ENCOUNTER — Encounter: Payer: Self-pay | Admitting: Cardiology

## 2012-02-29 ENCOUNTER — Telehealth: Payer: Self-pay | Admitting: Cardiology

## 2012-02-29 ENCOUNTER — Ambulatory Visit (INDEPENDENT_AMBULATORY_CARE_PROVIDER_SITE_OTHER): Payer: Medicare Other | Admitting: Cardiology

## 2012-02-29 VITALS — BP 120/64 | HR 55 | Ht 67.0 in | Wt 156.0 lb

## 2012-02-29 DIAGNOSIS — I5022 Chronic systolic (congestive) heart failure: Secondary | ICD-10-CM

## 2012-02-29 DIAGNOSIS — I714 Abdominal aortic aneurysm, without rupture: Secondary | ICD-10-CM

## 2012-02-29 DIAGNOSIS — F17200 Nicotine dependence, unspecified, uncomplicated: Secondary | ICD-10-CM

## 2012-02-29 DIAGNOSIS — I359 Nonrheumatic aortic valve disorder, unspecified: Secondary | ICD-10-CM

## 2012-02-29 DIAGNOSIS — I2510 Atherosclerotic heart disease of native coronary artery without angina pectoris: Secondary | ICD-10-CM

## 2012-02-29 MED ORDER — RAMIPRIL 5 MG PO CAPS: 5.0000 mg | ORAL_CAPSULE | Freq: Every day | ORAL | Status: DC

## 2012-02-29 MED ORDER — RAMIPRIL 5 MG PO CAPS: ORAL_CAPSULE | ORAL | Status: DC

## 2012-02-29 MED ORDER — RAMIPRIL 2.5 MG PO CAPS: 2.5000 mg | ORAL_CAPSULE | Freq: Every day | ORAL | Status: DC

## 2012-02-29 NOTE — Telephone Encounter (Signed)
plz return call to [facility] pharmacy [phone]  Dr. Shirlee Latch has requested a medication for pt that cannot be cut in half  Per dosage instruction.  The medication is a capsule.  Plz return call .

## 2012-02-29 NOTE — Telephone Encounter (Signed)
Spoke with [employer]. She will delete prescription for 1 and 1/2 of a 5mg  tablet send to [facility] earlier. Prescription for ramipril 5mg  daily and 2.5mg  daily sent to [facility] at pt's request.

## 2012-02-29 NOTE — Patient Instructions (Addendum)
Increase ramipril (altace) to 7.5mg  daily. You will need to take a 2.5mg  capsule and a 5mg  capsule daily at the same time.  Try nicotine patch 14mg  daily to help you stop smoking. You can get this without a prescription.  Your physician recommends that you return for lab work in: 10 days--BMET/BNP/Digoxin level.  Your physician recommends that you schedule a follow-up appointment in: 3 months with Dr Shirlee Latch.  Schedule an appointment for an ultrasound of your abdominal aorta in March 2014.
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
## 2012-03-02 ENCOUNTER — Ambulatory Visit (INDEPENDENT_AMBULATORY_CARE_PROVIDER_SITE_OTHER): Payer: Medicare Other | Admitting: Family Medicine

## 2012-03-02 ENCOUNTER — Encounter: Payer: Self-pay | Admitting: Family Medicine

## 2012-03-02 VITALS — BP 120/63 | HR 54 | Ht 67.0 in | Wt 158.0 lb

## 2012-03-02 DIAGNOSIS — M25519 Pain in unspecified shoulder: Secondary | ICD-10-CM

## 2012-03-02 DIAGNOSIS — M25511 Pain in right shoulder: Secondary | ICD-10-CM

## 2012-03-02 MED ORDER — HYDROCODONE-ACETAMINOPHEN 10-325 MG PO TABS: ORAL_TABLET | ORAL | Status: DC

## 2012-03-02 MED ORDER — ALBUTEROL SULFATE HFA 108 (90 BASE) MCG/ACT IN AERS: 2.0000 | INHALATION_SPRAY | Freq: Four times a day (QID) | RESPIRATORY_TRACT | Status: DC | PRN
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
## 2012-03-06 DIAGNOSIS — M25511 Pain in right shoulder: Secondary | ICD-10-CM | POA: Insufficient documentation

## 2012-03-06 NOTE — Assessment & Plan Note (Signed)
Multifactorial: I think AC joint arthritis, rotator cuff tendinopathy, and possibly labral pathology are contributing. I recommended ortho referral but he declines for now, citing financial and time constraints right now as well as emotional and mental fatigue due to the overwhelming amount of medical attention he has been receiving over the last 1-2 yrs.  He declined PT referral for the same reasons. I don't think injection of steroid into the joint is a good option for him at this time.   Will avoid over-reliance on NSAIDs for pain but he may use them sparingly to help minimize narcotic pain med requirement. I agreed to increase his Vicodin pill strength to 10/325, take 1-2 q6h prn (he took avg of 2 in morning and 2 in evening when on 7.5mg  vicodin strength), #120 today, RF x 3.

## 2012-03-11 ENCOUNTER — Other Ambulatory Visit: Payer: Medicare Other

## 2012-03-15 ENCOUNTER — Other Ambulatory Visit: Payer: Medicare Other

## 2012-03-21 ENCOUNTER — Other Ambulatory Visit (INDEPENDENT_AMBULATORY_CARE_PROVIDER_SITE_OTHER): Payer: Medicare Other

## 2012-03-21 DIAGNOSIS — I359 Nonrheumatic aortic valve disorder, unspecified: Secondary | ICD-10-CM

## 2012-03-21 DIAGNOSIS — I5022 Chronic systolic (congestive) heart failure: Secondary | ICD-10-CM

## 2012-03-21 LAB — BRAIN NATRIURETIC PEPTIDE: Pro B Natriuretic peptide (BNP): 30 pg/mL (ref 0.0–100.0)

## 2012-03-21 LAB — BASIC METABOLIC PANEL WITH GFR
BUN: 11 mg/dL (ref 6–23)
CO2: 27 meq/L (ref 19–32)
Calcium: 9 mg/dL (ref 8.4–10.5)
Chloride: 104 meq/L (ref 96–112)
Creatinine, Ser: 1.3 mg/dL (ref 0.4–1.5)
GFR: 59.13 mL/min — ABNORMAL LOW
Glucose, Bld: 83 mg/dL (ref 70–99)
Potassium: 4 meq/L (ref 3.5–5.1)
Sodium: 136 meq/L (ref 135–145)

## 2012-03-22 ENCOUNTER — Telehealth: Payer: Self-pay | Admitting: Cardiology

## 2012-03-22 LAB — DIGOXIN LEVEL: Digoxin Level: 1.2 ng/mL (ref 0.8–2.0)

## 2012-03-22 NOTE — Telephone Encounter (Signed)
The patient dropped off a mail order form to be completed by Dr. Shirlee Latch. The original was placed in Dr. Alford Highland box and medical records will retain a copy...djc

## 2012-03-24 ENCOUNTER — Other Ambulatory Visit: Payer: Self-pay

## 2012-03-24 MED ORDER — DIGOXIN 125 MCG PO TABS: 0.0625 mg | ORAL_TABLET | Freq: Every day | ORAL | Status: DC

## 2012-03-27 NOTE — Progress Notes (Signed)
Patient waited a long time to see me today and he eventually had to leave the office to make it to another appointment, so he was not actually seen by me today.

## 2012-03-28 ENCOUNTER — Other Ambulatory Visit: Payer: Self-pay | Admitting: *Deleted

## 2012-03-28 DIAGNOSIS — I5022 Chronic systolic (congestive) heart failure: Secondary | ICD-10-CM

## 2012-03-28 MED ORDER — RAMIPRIL 2.5 MG PO CAPS: 2.5000 mg | ORAL_CAPSULE | Freq: Every day | ORAL | Status: DC

## 2012-03-28 MED ORDER — CARVEDILOL 12.5 MG PO TABS: 12.5000 mg | ORAL_TABLET | Freq: Two times a day (BID) | ORAL | Status: DC

## 2012-03-28 MED ORDER — RAMIPRIL 5 MG PO CAPS: 5.0000 mg | ORAL_CAPSULE | Freq: Every day | ORAL | Status: DC

## 2012-03-28 MED ORDER — DIGOXIN 125 MCG PO TABS: 0.0625 mg | ORAL_TABLET | Freq: Every day | ORAL | Status: DC

## 2012-03-31 ENCOUNTER — Other Ambulatory Visit: Payer: Self-pay | Admitting: *Deleted

## 2012-03-31 MED ORDER — RAMIPRIL 5 MG PO CAPS: ORAL_CAPSULE | ORAL | Status: DC

## 2012-03-31 MED ORDER — RAMIPRIL 2.5 MG PO CAPS: ORAL_CAPSULE | ORAL | Status: DC

## 2012-04-12 ENCOUNTER — Other Ambulatory Visit: Payer: Self-pay | Admitting: Family Medicine

## 2012-04-12 MED ORDER — DIAZEPAM 10 MG PO TABS: 10.0000 mg | ORAL_TABLET | Freq: Three times a day (TID) | ORAL | Status: DC | PRN

## 2012-04-12 NOTE — Telephone Encounter (Signed)
Refill request for DIAZEPAM Last filled- 02/07/12, #90 X 0 Last seen- 03/02/12 Follow up - 4 MONTHS Please advise refills

## 2012-04-12 NOTE — Telephone Encounter (Signed)
RX faxed

## 2012-04-25 ENCOUNTER — Encounter: Payer: Medicare Other | Admitting: *Deleted

## 2012-04-28 ENCOUNTER — Encounter: Payer: Self-pay | Admitting: *Deleted

## 2012-05-04 ENCOUNTER — Telehealth: Payer: Self-pay | Admitting: Internal Medicine

## 2012-05-04 ENCOUNTER — Ambulatory Visit (INDEPENDENT_AMBULATORY_CARE_PROVIDER_SITE_OTHER): Payer: Medicare Other | Admitting: *Deleted

## 2012-05-04 DIAGNOSIS — Z95810 Presence of automatic (implantable) cardiac defibrillator: Secondary | ICD-10-CM

## 2012-05-04 DIAGNOSIS — I5022 Chronic systolic (congestive) heart failure: Secondary | ICD-10-CM

## 2012-05-04 DIAGNOSIS — I428 Other cardiomyopathies: Secondary | ICD-10-CM

## 2012-05-04 DIAGNOSIS — I429 Cardiomyopathy, unspecified: Secondary | ICD-10-CM

## 2012-05-04 NOTE — Telephone Encounter (Signed)
Patient to resend missed transmission.

## 2012-05-04 NOTE — Telephone Encounter (Signed)
New Problem:    Patient's wife called in needing to speak with you about her husband's transmissions.  Please call back.

## 2012-05-06 LAB — REMOTE ICD DEVICE
AL AMPLITUDE: 2.4 mv
ATRIAL PACING ICD: 1.45 pct
BAMS-0001: 170 {beats}/min
CHARGE TIME: 8.828 s
FVT: 0
LV LEAD THRESHOLD: 1.25 V
PACEART VT: 0
RV LEAD AMPLITUDE: 18.1 mv
RV LEAD IMPEDENCE ICD: 456 Ohm
RV LEAD THRESHOLD: 0.875 V
TOT-0001: 0
TZAT-0001ATACH: 1
TZAT-0001ATACH: 2
TZAT-0001ATACH: 3
TZAT-0002ATACH: NEGATIVE
TZAT-0002ATACH: NEGATIVE
TZAT-0004SLOWVT: 8
TZAT-0005SLOWVT: 88 pct
TZAT-0011SLOWVT: 10 ms
TZAT-0012ATACH: 150 ms
TZAT-0012FASTVT: 170 ms
TZAT-0012SLOWVT: 170 ms
TZAT-0018ATACH: NEGATIVE
TZAT-0018ATACH: NEGATIVE
TZAT-0019ATACH: 6 V
TZAT-0019ATACH: 6 V
TZAT-0019FASTVT: 8 V
TZAT-0019SLOWVT: 8 V
TZAT-0020FASTVT: 1.5 ms
TZON-0003SLOWVT: 300 ms
TZON-0004VSLOWVT: 32
TZST-0001ATACH: 5
TZST-0001ATACH: 6
TZST-0001FASTVT: 2
TZST-0001FASTVT: 5
TZST-0001FASTVT: 6
TZST-0001SLOWVT: 3
TZST-0001SLOWVT: 6
TZST-0002ATACH: NEGATIVE
TZST-0002ATACH: NEGATIVE
TZST-0002FASTVT: NEGATIVE
TZST-0002FASTVT: NEGATIVE
TZST-0002FASTVT: NEGATIVE
TZST-0003SLOWVT: 20 J
TZST-0003SLOWVT: 35 J
VF: 0

## 2012-05-17 ENCOUNTER — Telehealth: Payer: Self-pay | Admitting: Family Medicine

## 2012-05-17 NOTE — Telephone Encounter (Signed)
Patient Information:  Caller Name: Jaziel  Phone: [phone]  Patient: Marvin Bell, Marvin Bell  Gender: Male  DOB: October 01, 1945  Age: 67 Years  PCP: Earley Favor [facility])  Office Follow Up:  Does the office need to follow up with this patient?: No  Instructions For The Office: N/A  RN Note:  Caller has no desire to eat. Onset 10 months ago. Caller denies weight loss at this time because he drinking fluids. Caller is voiding and interacting within normal limits. Caller states he feels depressed occasionally but nothing out of the normal. Caller denies abdominal pain, difficulty swallowing, and rectal bleeding. Triaged per [facility] Weight Loss, Unintentional: RN advised see provider within 72 hours due to positive loss of appetite for 3 or more days.  Symptoms  Reason For Call & Symptoms: decreased appetite for 10 months.  Reviewed Health History In EMR: Yes  Reviewed Medications In EMR: Yes  Reviewed Allergies In EMR: Yes  Reviewed Surgeries / Procedures: Yes  Date of Onset of Symptoms: Unknown  Guideline(s) Used:  No Protocol Available - Sick Adult  Disposition Per Guideline:   See Within 3 Days in Office  Reason For Disposition Reached:   Nursing judgment  Advice Given:  Call Back If:  New symptoms develop  You become worse.  Appointment Scheduled:  05/18/2012 10:30:00 Appointment Scheduled Provider:  Earley Favor [facility])

## 2012-05-17 NOTE — Telephone Encounter (Signed)
Please advise 

## 2012-05-18 ENCOUNTER — Ambulatory Visit: Payer: Self-pay | Admitting: Family Medicine

## 2012-05-18 NOTE — Telephone Encounter (Signed)
Pt to be seen today

## 2012-06-01 ENCOUNTER — Telehealth: Payer: Self-pay | Admitting: Family Medicine

## 2012-06-01 ENCOUNTER — Telehealth: Payer: Self-pay | Admitting: Cardiology

## 2012-06-01 MED ORDER — CARVEDILOL 12.5 MG PO TABS: 12.5000 mg | ORAL_TABLET | Freq: Two times a day (BID) | ORAL | Status: DC

## 2012-06-01 MED ORDER — SPIRONOLACTONE 25 MG PO TABS: 25.0000 mg | ORAL_TABLET | Freq: Every day | ORAL | Status: DC

## 2012-06-01 MED ORDER — RAMIPRIL 5 MG PO CAPS: ORAL_CAPSULE | ORAL | Status: DC

## 2012-06-01 NOTE — Telephone Encounter (Signed)
New problem   Pt need a new prescription for Spironolact 25mg  per his wife at [facility] [phone].

## 2012-06-01 NOTE — Telephone Encounter (Signed)
Patient needs refill on medication to [facility]. Called to confirm refill orders. Refills ordered for Ramipril, Carvedilol and Spironolactone to [facility].

## 2012-06-01 NOTE — Telephone Encounter (Signed)
Call pt back and tell him he needs to call the pharmacy and request the RF and then if he has no further RFs on that rx the pharmacy will contact our office to request new RF's.-thx

## 2012-06-02 ENCOUNTER — Encounter: Payer: Self-pay | Admitting: Cardiology

## 2012-06-06 ENCOUNTER — Other Ambulatory Visit: Payer: Self-pay | Admitting: *Deleted

## 2012-06-06 MED ORDER — RAMIPRIL 5 MG PO CAPS: ORAL_CAPSULE | ORAL | Status: DC

## 2012-06-07 ENCOUNTER — Encounter: Payer: Self-pay | Admitting: *Deleted

## 2012-06-08 ENCOUNTER — Other Ambulatory Visit: Payer: Self-pay | Admitting: *Deleted

## 2012-06-08 MED ORDER — TRAZODONE HCL 50 MG PO TABS: 50.0000 mg | ORAL_TABLET | Freq: Every evening | ORAL | Status: DC | PRN

## 2012-06-08 NOTE — Telephone Encounter (Signed)
Refill request for TRAZODONE Last filled-09/11/11, #30 X 5 Last seen- 03/02/12 Follow up - 4 MONTHS Please advise refills.

## 2012-06-09 ENCOUNTER — Encounter: Payer: Self-pay | Admitting: Internal Medicine

## 2012-06-09 MED ORDER — TRAZODONE HCL 50 MG PO TABS: 50.0000 mg | ORAL_TABLET | Freq: Every evening | ORAL | Status: DC | PRN

## 2012-06-09 NOTE — Telephone Encounter (Signed)
RX sent to [facility] in error.  RX resent to [facility]

## 2012-06-09 NOTE — Addendum Note (Signed)
Addended by: Luisa Dago on: 06/09/2012 01:21 PM   Modules accepted: Orders

## 2012-06-13 ENCOUNTER — Ambulatory Visit: Payer: Medicare Other | Admitting: Cardiology

## 2012-06-15 ENCOUNTER — Other Ambulatory Visit: Payer: Self-pay | Admitting: *Deleted

## 2012-06-15 MED ORDER — VENLAFAXINE HCL ER 75 MG PO CP24: 75.0000 mg | ORAL_CAPSULE | Freq: Three times a day (TID) | ORAL | Status: DC

## 2012-06-15 NOTE — Telephone Encounter (Signed)
Refill request for VENLAFAXINE Last filled-  Last seen- 02/2012 RX sent.

## 2012-07-01 ENCOUNTER — Ambulatory Visit: Payer: Medicare Other | Admitting: Family Medicine

## 2012-07-01 ENCOUNTER — Telehealth: Payer: Self-pay | Admitting: Family Medicine

## 2012-07-01 NOTE — Telephone Encounter (Signed)
I spoke to [facility] who says pt does not want to eat.  He drinks Coke all day, occasionally some juice.  Says food does not have a taste and will only eat once a day around 3-4 o'clock.  Pt takes all meds on an empty stomach.  Pt will not drink Boost or Ensure.  Dennie Bible wants to know what we can do. Please advise.

## 2012-07-01 NOTE — Telephone Encounter (Signed)
He has a long history of this. All we can do is encourage him to come in for evaluation in the office.--thx

## 2012-07-06 ENCOUNTER — Encounter: Payer: Self-pay | Admitting: Family Medicine

## 2012-07-06 ENCOUNTER — Ambulatory Visit (INDEPENDENT_AMBULATORY_CARE_PROVIDER_SITE_OTHER)
Admission: RE | Admit: 2012-07-06 | Discharge: 2012-07-06 | Disposition: A | Discharge status: Home / Self Care | Payer: Medicare Other | Source: Ambulatory Visit | Attending: Family Medicine | Admitting: Family Medicine

## 2012-07-06 ENCOUNTER — Ambulatory Visit (INDEPENDENT_AMBULATORY_CARE_PROVIDER_SITE_OTHER): Payer: Medicare Other | Admitting: Family Medicine

## 2012-07-06 VITALS — BP 100/62 | HR 60 | Temp 98.0°F | Ht 67.0 in | Wt 155.0 lb

## 2012-07-06 DIAGNOSIS — R0689 Other abnormalities of breathing: Secondary | ICD-10-CM

## 2012-07-06 DIAGNOSIS — M19019 Primary osteoarthritis, unspecified shoulder: Secondary | ICD-10-CM

## 2012-07-06 DIAGNOSIS — R0989 Other specified symptoms and signs involving the circulatory and respiratory systems: Secondary | ICD-10-CM

## 2012-07-06 DIAGNOSIS — M76899 Other specified enthesopathies of unspecified lower limb, excluding foot: Secondary | ICD-10-CM

## 2012-07-06 DIAGNOSIS — J42 Unspecified chronic bronchitis: Secondary | ICD-10-CM

## 2012-07-06 DIAGNOSIS — M7061 Trochanteric bursitis, right hip: Secondary | ICD-10-CM

## 2012-07-06 DIAGNOSIS — M2550 Pain in unspecified joint: Secondary | ICD-10-CM

## 2012-07-06 MED ORDER — DESLORATADINE 5 MG PO TABS: 5.0000 mg | ORAL_TABLET | Freq: Every day | ORAL | Status: DC

## 2012-07-06 MED ORDER — HYDROCODONE-ACETAMINOPHEN 10-325 MG PO TABS: ORAL_TABLET | ORAL | Status: DC

## 2012-07-06 MED ORDER — CLINDAMYCIN HCL 300 MG PO CAPS: 300.0000 mg | ORAL_CAPSULE | Freq: Three times a day (TID) | ORAL | Status: DC

## 2012-07-06 MED ORDER — ALBUTEROL SULFATE HFA 108 (90 BASE) MCG/ACT IN AERS: 2.0000 | INHALATION_SPRAY | Freq: Four times a day (QID) | RESPIRATORY_TRACT | Status: DC | PRN
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
## 2012-07-07 ENCOUNTER — Encounter (INDEPENDENT_AMBULATORY_CARE_PROVIDER_SITE_OTHER): Payer: Medicare Other

## 2012-07-07 DIAGNOSIS — I700 Atherosclerosis of aorta: Secondary | ICD-10-CM

## 2012-07-07 DIAGNOSIS — I714 Abdominal aortic aneurysm, without rupture: Secondary | ICD-10-CM

## 2012-07-07 NOTE — Telephone Encounter (Signed)
Discussed at pt visit on 07/06/12

## 2012-07-13 ENCOUNTER — Encounter: Payer: Self-pay | Admitting: Cardiology

## 2012-07-13 ENCOUNTER — Ambulatory Visit (INDEPENDENT_AMBULATORY_CARE_PROVIDER_SITE_OTHER): Payer: Medicare Other | Admitting: Cardiology

## 2012-07-13 VITALS — BP 110/64 | HR 63 | Wt 154.0 lb

## 2012-07-13 DIAGNOSIS — I2589 Other forms of chronic ischemic heart disease: Secondary | ICD-10-CM

## 2012-07-13 DIAGNOSIS — F17200 Nicotine dependence, unspecified, uncomplicated: Secondary | ICD-10-CM

## 2012-07-13 DIAGNOSIS — I25810 Atherosclerosis of coronary artery bypass graft(s) without angina pectoris: Secondary | ICD-10-CM

## 2012-07-13 DIAGNOSIS — I5022 Chronic systolic (congestive) heart failure: Secondary | ICD-10-CM

## 2012-07-13 DIAGNOSIS — I255 Ischemic cardiomyopathy: Secondary | ICD-10-CM

## 2012-07-13 DIAGNOSIS — I359 Nonrheumatic aortic valve disorder, unspecified: Secondary | ICD-10-CM

## 2012-07-13 DIAGNOSIS — I714 Abdominal aortic aneurysm, without rupture, unspecified: Secondary | ICD-10-CM

## 2012-07-13 DIAGNOSIS — E785 Hyperlipidemia, unspecified: Secondary | ICD-10-CM

## 2012-07-13 LAB — LIPID PANEL: Cholesterol: 168 mg/dL (ref 0–200)

## 2012-07-13 NOTE — Patient Instructions (Addendum)
Your physician recommends that you schedule a follow-up appointment in: 3 months with Dr Shirlee Latch  Your physician recommends that you return for lab work today Lipid/bmp/bnp/dig level

## 2012-07-14 LAB — BASIC METABOLIC PANEL WITH GFR

## 2012-07-14 LAB — DIGOXIN LEVEL: Digoxin Level: 0.8 ng/mL (ref 0.8–2.0)
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
## 2012-07-16 ENCOUNTER — Encounter: Payer: Self-pay | Admitting: Family Medicine

## 2012-07-16 DIAGNOSIS — M2550 Pain in unspecified joint: Secondary | ICD-10-CM

## 2012-07-16 HISTORY — DX: Pain in unspecified joint: M25.50

## 2012-07-18 ENCOUNTER — Encounter: Payer: Self-pay | Admitting: *Deleted

## 2012-07-20 ENCOUNTER — Ambulatory Visit: Payer: Medicare Other | Admitting: Family Medicine

## 2012-08-01 ENCOUNTER — Encounter: Payer: Medicare Other | Admitting: *Deleted

## 2012-08-03 ENCOUNTER — Ambulatory Visit (INDEPENDENT_AMBULATORY_CARE_PROVIDER_SITE_OTHER): Payer: Medicare Other | Admitting: Family Medicine

## 2012-08-03 ENCOUNTER — Encounter: Payer: Self-pay | Admitting: Family Medicine

## 2012-08-03 VITALS — BP 103/61 | HR 78 | Temp 98.1°F | Resp 18 | Wt 151.5 lb

## 2012-08-03 DIAGNOSIS — M25511 Pain in right shoulder: Secondary | ICD-10-CM

## 2012-08-03 DIAGNOSIS — M25519 Pain in unspecified shoulder: Secondary | ICD-10-CM

## 2012-08-03 DIAGNOSIS — M19019 Primary osteoarthritis, unspecified shoulder: Secondary | ICD-10-CM

## 2012-08-03 DIAGNOSIS — M67919 Unspecified disorder of synovium and tendon, unspecified shoulder: Secondary | ICD-10-CM

## 2012-08-03 DIAGNOSIS — M7581 Other shoulder lesions, right shoulder: Secondary | ICD-10-CM

## 2012-08-03 MED ORDER — METHYLPREDNISOLONE ACETATE 40 MG/ML IJ SUSP
20.0000 mg | Freq: Once | INTRAMUSCULAR | Status: AC
  Administered 2012-08-03: 20 mg via INTRA_ARTICULAR

## 2012-08-03 MED ORDER — METHYLPREDNISOLONE ACETATE 40 MG/ML IJ SUSP
40.0000 mg | Freq: Once | INTRAMUSCULAR | Status: AC
  Administered 2012-08-03: 40 mg via INTRA_ARTICULAR

## 2012-08-03 MED ORDER — METHYLPREDNISOLONE ACETATE 40 MG/ML IJ SUSP
150.0000 mg | Freq: Once | INTRAMUSCULAR | Status: AC
  Administered 2012-08-03: 150 mg via INTRAMUSCULAR
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
## 2012-08-03 NOTE — Progress Notes (Signed)
Erroneous Entry x2 for Methylprednisolone 150 mg IM injections; charges for both injections & medications have been deleted/SLS Correct injection entries have been made per provider instructions/SLS CMA, (AAMA)

## 2012-08-06 IMAGING — CR DG CHEST 2V
2 series · 2 of 2 positions shown · non-contrast
Comparison: 10/24/2011 and prior chest radiographs

CLINICAL DATA: 66-year-old male - recent left thoracotomy.

CHEST - 2 VIEW

[w chest pa]
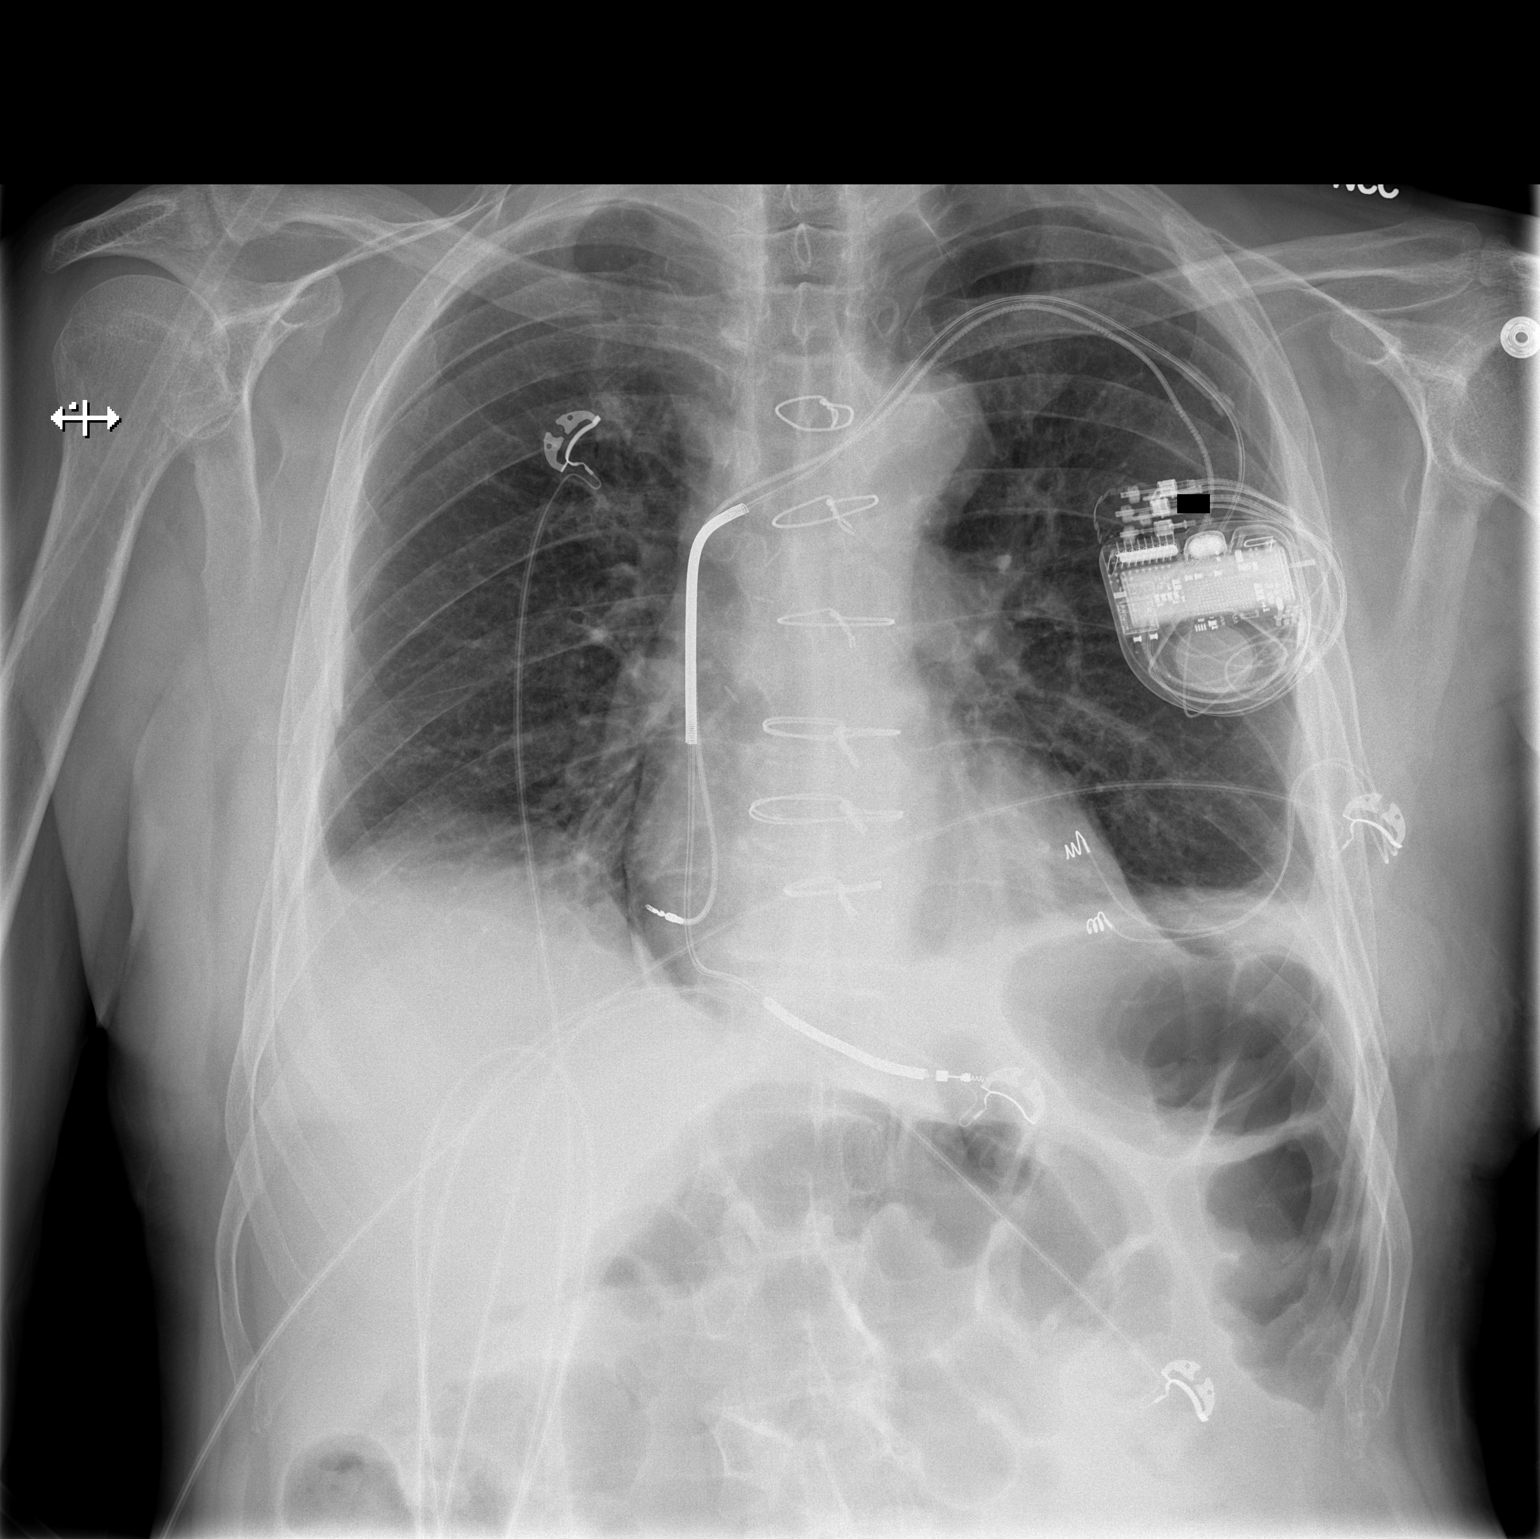

[w chest lat]
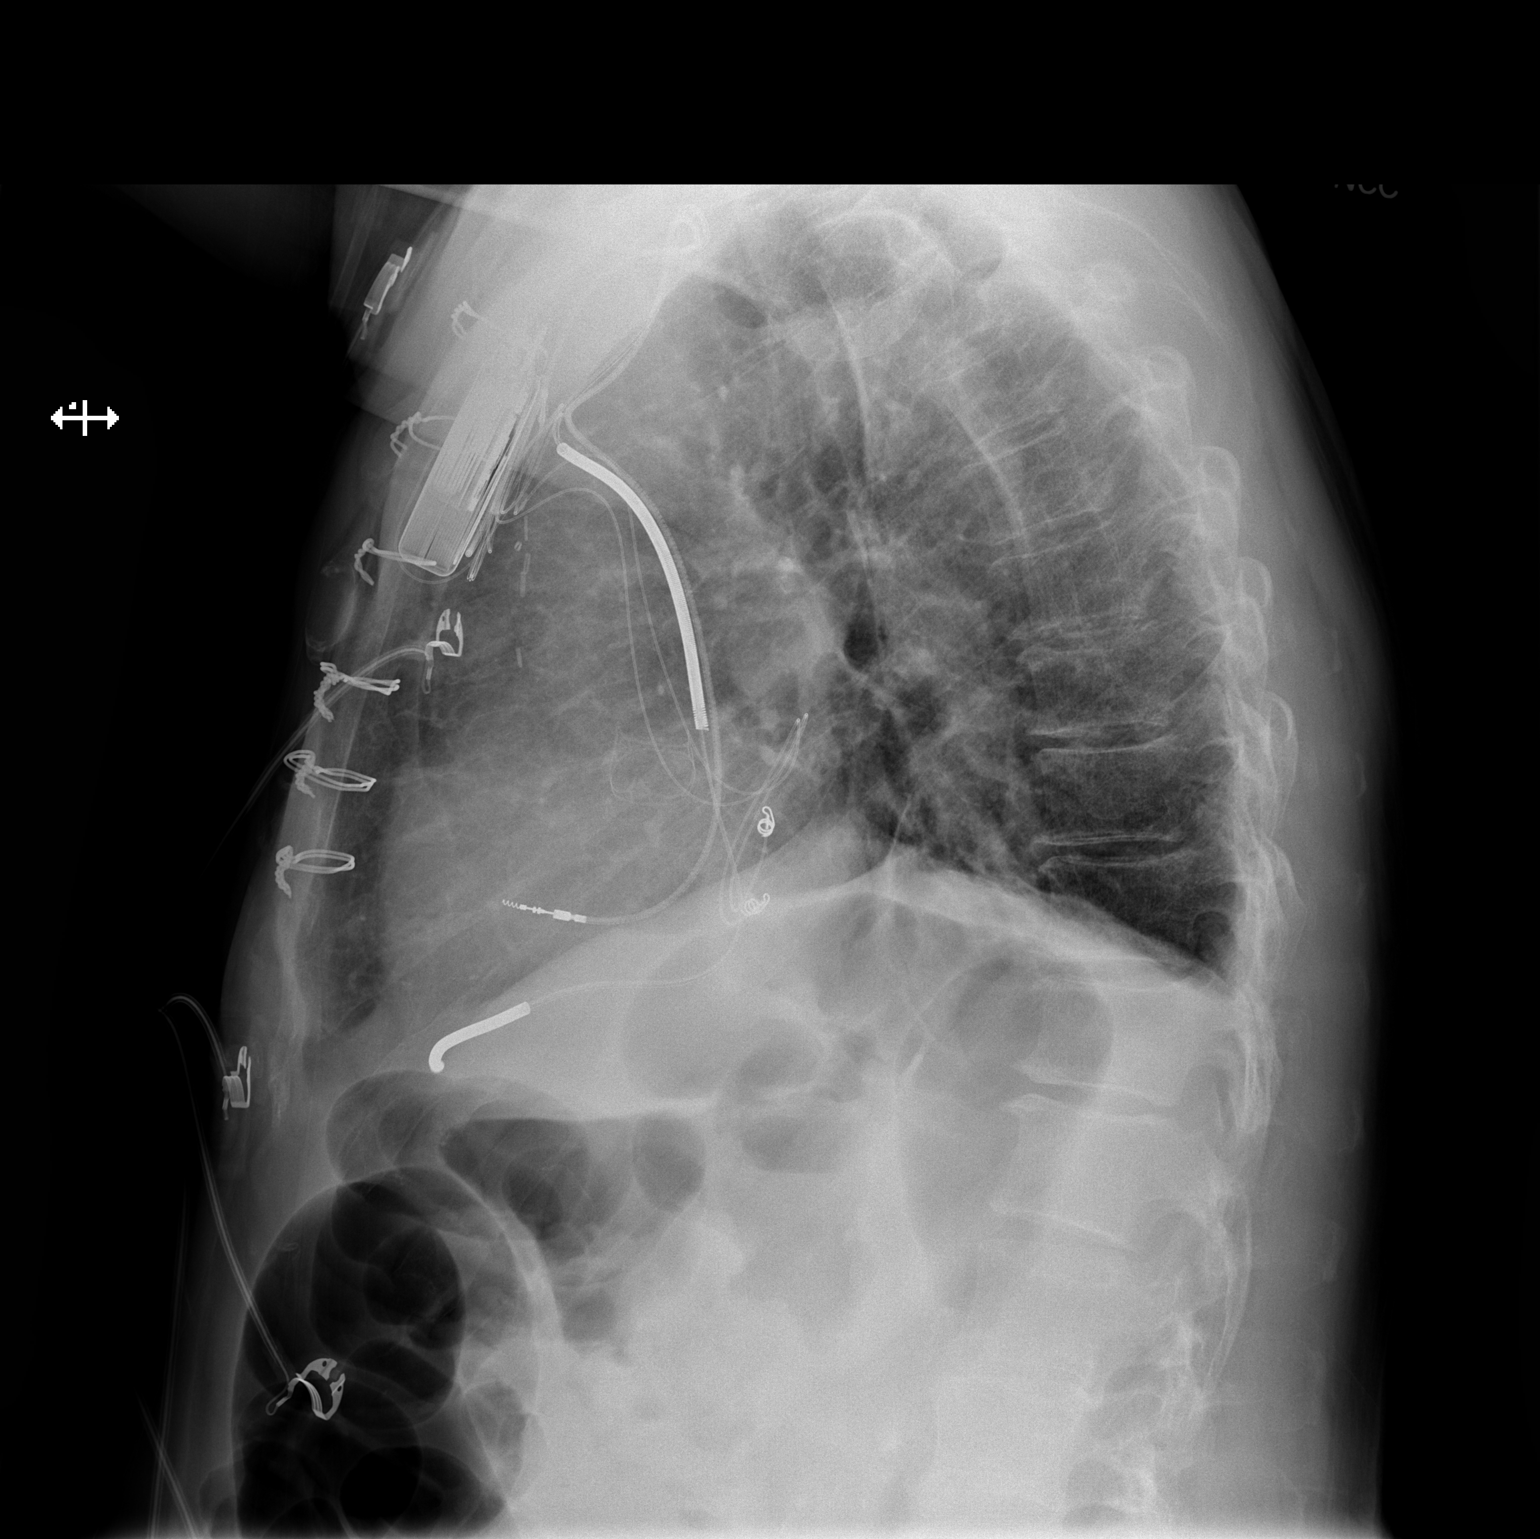

[2 of 2 positions shown; findings below may reference images not displayed]

FINDINGS: Cardiac surgical changes and left AICD / pacemaker again
noted.
Left-sided epicardial pacer wires are unchanged.
Mild basilar atelectasis is unchanged.
There is no evidence of airspace disease, pleural effusion,
pneumothorax or pulmonary edema.
IMPRESSION: Unchanged chest radiograph with mild basilar atelectasis and
support apparatus as described.

## 2012-08-07 DIAGNOSIS — M25519 Pain in unspecified shoulder: Secondary | ICD-10-CM | POA: Insufficient documentation

## 2012-08-07 NOTE — Assessment & Plan Note (Signed)
Right shoulder AC joint arthritis + rotator cuff tendonitis/impingement syndrome. He is desiring steroid joint injection in both areas today if possible.  Procedure: Therapeutic shoulder injection.  The patient's clinical condition is marked by substantial pain and/or significant functional disability.  Other conservative therapy has not provided relief, is contraindicated, or not appropriate.  There is a reasonable likelihood that injection will significantly improve the patient's pain and/or functional disability. Cleaned skin with alcohol swab, used posterolateral approach, Injected 1  ml of 40mg /ml depo-medrol+15ml of 2% lidocaine w/out epi into subacromial space without resistance.  No immediate complications.  Patient tolerated procedure well.   I also used anterior approach to inject 1/2 ml of 40mg /ml depo-medrol + 1/2 ml of 2% lidocaine w/out epi into the right AC joint.  Post-injection care discussed, including 20 min of icing 1-2 times in the next 4-8 hours, frequent non weight-bearing ROM exercises over the next few days, and general pain medication management.

## 2012-08-09 ENCOUNTER — Ambulatory Visit (INDEPENDENT_AMBULATORY_CARE_PROVIDER_SITE_OTHER): Payer: Medicare Other | Admitting: *Deleted

## 2012-08-09 ENCOUNTER — Encounter: Payer: Self-pay | Admitting: Internal Medicine

## 2012-08-09 DIAGNOSIS — I5022 Chronic systolic (congestive) heart failure: Secondary | ICD-10-CM

## 2012-08-09 DIAGNOSIS — I428 Other cardiomyopathies: Secondary | ICD-10-CM

## 2012-08-09 DIAGNOSIS — I429 Cardiomyopathy, unspecified: Secondary | ICD-10-CM

## 2012-08-09 DIAGNOSIS — Z95810 Presence of automatic (implantable) cardiac defibrillator: Secondary | ICD-10-CM

## 2012-08-12 ENCOUNTER — Telehealth: Payer: Self-pay | Admitting: Family Medicine

## 2012-08-12 MED ORDER — CEPHALEXIN 500 MG PO CAPS: 500.0000 mg | ORAL_CAPSULE | Freq: Three times a day (TID) | ORAL | Status: DC

## 2012-08-12 NOTE — Telephone Encounter (Signed)
Cephalexin eRx'd to pharmacy just now.

## 2012-08-15 LAB — REMOTE ICD DEVICE
AL IMPEDENCE ICD: 551 Ohm
BAMS-0001: 170 {beats}/min
CHARGE TIME: 9.239 s
LV LEAD IMPEDENCE ICD: 513 Ohm
PACEART VT: 0
RV LEAD AMPLITUDE: 17.8 mv
RV LEAD IMPEDENCE ICD: 551 Ohm
TOT-0001: 0
TOT-0002: 0
TOT-0006: 20130410000000
TZAT-0001FASTVT: 1
TZAT-0002ATACH: NEGATIVE
TZAT-0002ATACH: NEGATIVE
TZAT-0002FASTVT: NEGATIVE
TZAT-0012ATACH: 150 ms
TZAT-0012FASTVT: 170 ms
TZAT-0012SLOWVT: 170 ms
TZAT-0013SLOWVT: 3
TZAT-0018ATACH: NEGATIVE
TZAT-0018SLOWVT: NEGATIVE
TZAT-0019ATACH: 6 V
TZAT-0019ATACH: 6 V
TZAT-0019ATACH: 6 V
TZAT-0019SLOWVT: 8 V
TZAT-0020ATACH: 1.5 ms
TZAT-0020SLOWVT: 1.5 ms
TZON-0003SLOWVT: 300 ms
TZON-0004SLOWVT: 28
TZST-0001ATACH: 5
TZST-0001FASTVT: 2
TZST-0001FASTVT: 4
TZST-0001SLOWVT: 4
TZST-0001SLOWVT: 6
TZST-0002FASTVT: NEGATIVE
TZST-0002FASTVT: NEGATIVE
TZST-0003SLOWVT: 35 J
TZST-0003SLOWVT: 35 J

## 2012-08-15 NOTE — Telephone Encounter (Signed)
Contacted patient, patient has already picked up medication.

## 2012-08-19 ENCOUNTER — Other Ambulatory Visit: Payer: Self-pay | Admitting: Family Medicine

## 2012-08-19 NOTE — Telephone Encounter (Signed)
Already been sent

## 2012-08-23 ENCOUNTER — Encounter: Payer: Self-pay | Admitting: Cardiology

## 2012-08-23 ENCOUNTER — Telehealth: Payer: Self-pay | Admitting: Family Medicine

## 2012-08-23 NOTE — Telephone Encounter (Signed)
Patient states his gums are better but that they are not completely better, need another Rx for antibiotics. Patient is out of Crestor.

## 2012-08-24 ENCOUNTER — Other Ambulatory Visit: Payer: Self-pay | Admitting: Family Medicine

## 2012-08-24 MED ORDER — ROSUVASTATIN CALCIUM 5 MG PO TABS: 5.0000 mg | ORAL_TABLET | Freq: Every day | ORAL | Status: DC

## 2012-08-24 MED ORDER — CEPHALEXIN 500 MG PO CAPS: 500.0000 mg | ORAL_CAPSULE | Freq: Three times a day (TID) | ORAL | Status: DC

## 2012-08-24 NOTE — Telephone Encounter (Signed)
Pls notify pt that I'll do this one final 10 day course of abx but I am not supposed to be treating his gum disease.  All I am doing with these antibiotics is a "band-aid".  He needs to have the issue of his poor dentition and gums addressed by a dentist--ASAP--esp since he has a heart valve issue! -thx

## 2012-08-24 NOTE — Telephone Encounter (Signed)
Crestor request has been done to pharmacy; please advise on ABX request/SLS

## 2012-08-24 NOTE — Telephone Encounter (Signed)
Pt unavailable; spoke with spouse; informed of provider's instructions, reiterated importance of seeing his dentist [heart valve issue]; spouse understood & agreed to inform patient Marvin Bell she has been trying to get pt to return to dentist]/SLS

## 2012-08-29 ENCOUNTER — Other Ambulatory Visit: Payer: Self-pay | Admitting: Family Medicine

## 2012-08-29 NOTE — Telephone Encounter (Signed)
SureScript request for DIAZAPAM; EFFEXOR; ZOLPIDEM Diazepam Last Rx: 01.14.14, #90x1 Effexor Last Rx: 03.19.14 #90x1 Zolpidem Last Rx: 11.10.13 #30x5 Last OV: 05.07.14 Follow-Up: 3 months Please advise on refills/SLS

## 2012-08-29 NOTE — Telephone Encounter (Signed)
Rx request faxed to pharmacy/SLS  

## 2012-08-29 NOTE — Telephone Encounter (Signed)
RFs done 

## 2012-09-06 ENCOUNTER — Encounter: Payer: Self-pay | Admitting: *Deleted

## 2012-09-21 IMAGING — CR DG CHEST 2V
2 series · 2 of 2 positions shown · non-contrast
Comparison: Chest x-ray of 11/17/2011

CLINICAL DATA: Shortness of breath, cough, left lower anterior
chest pain

CHEST - 2 VIEW

[w chest pa]
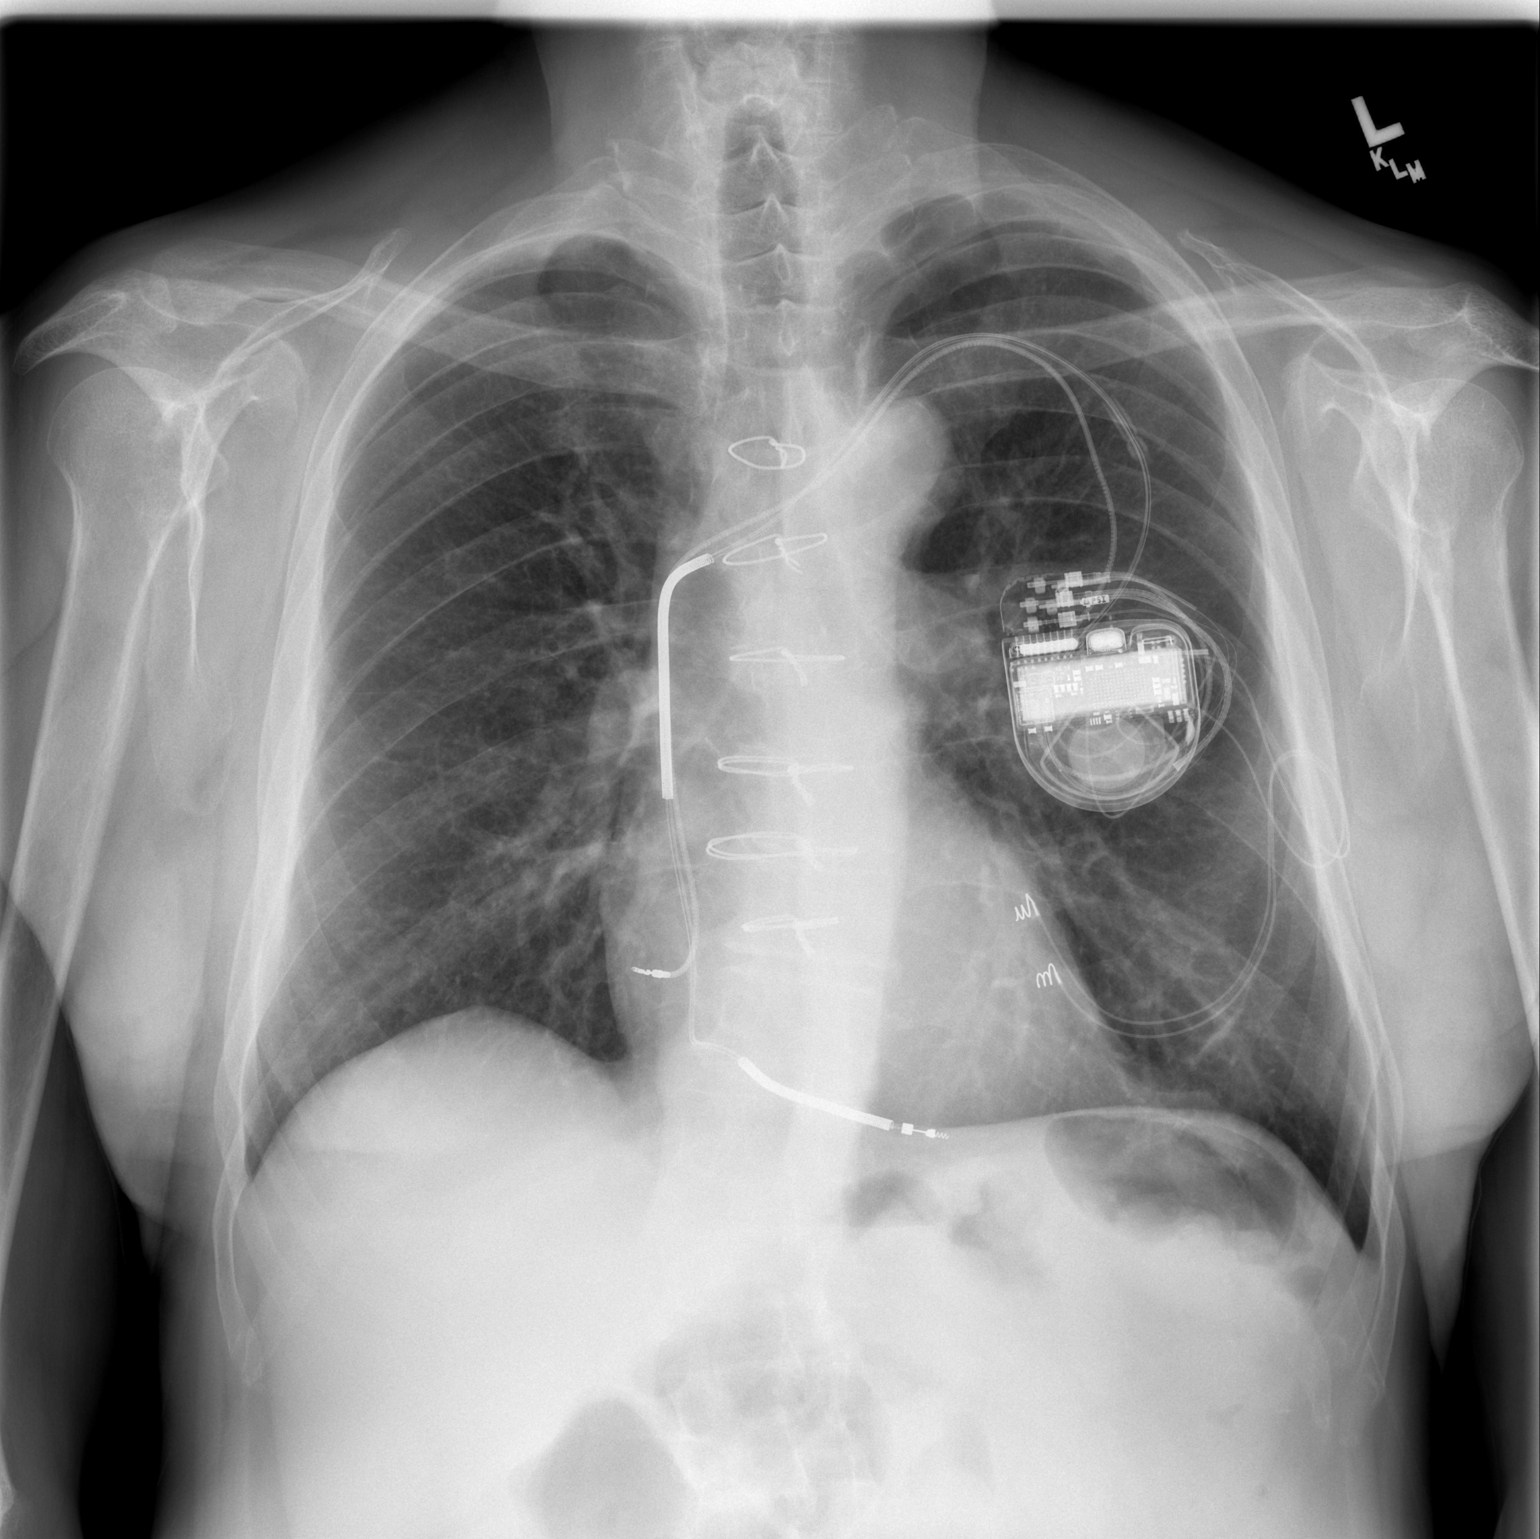

[w chest lat]
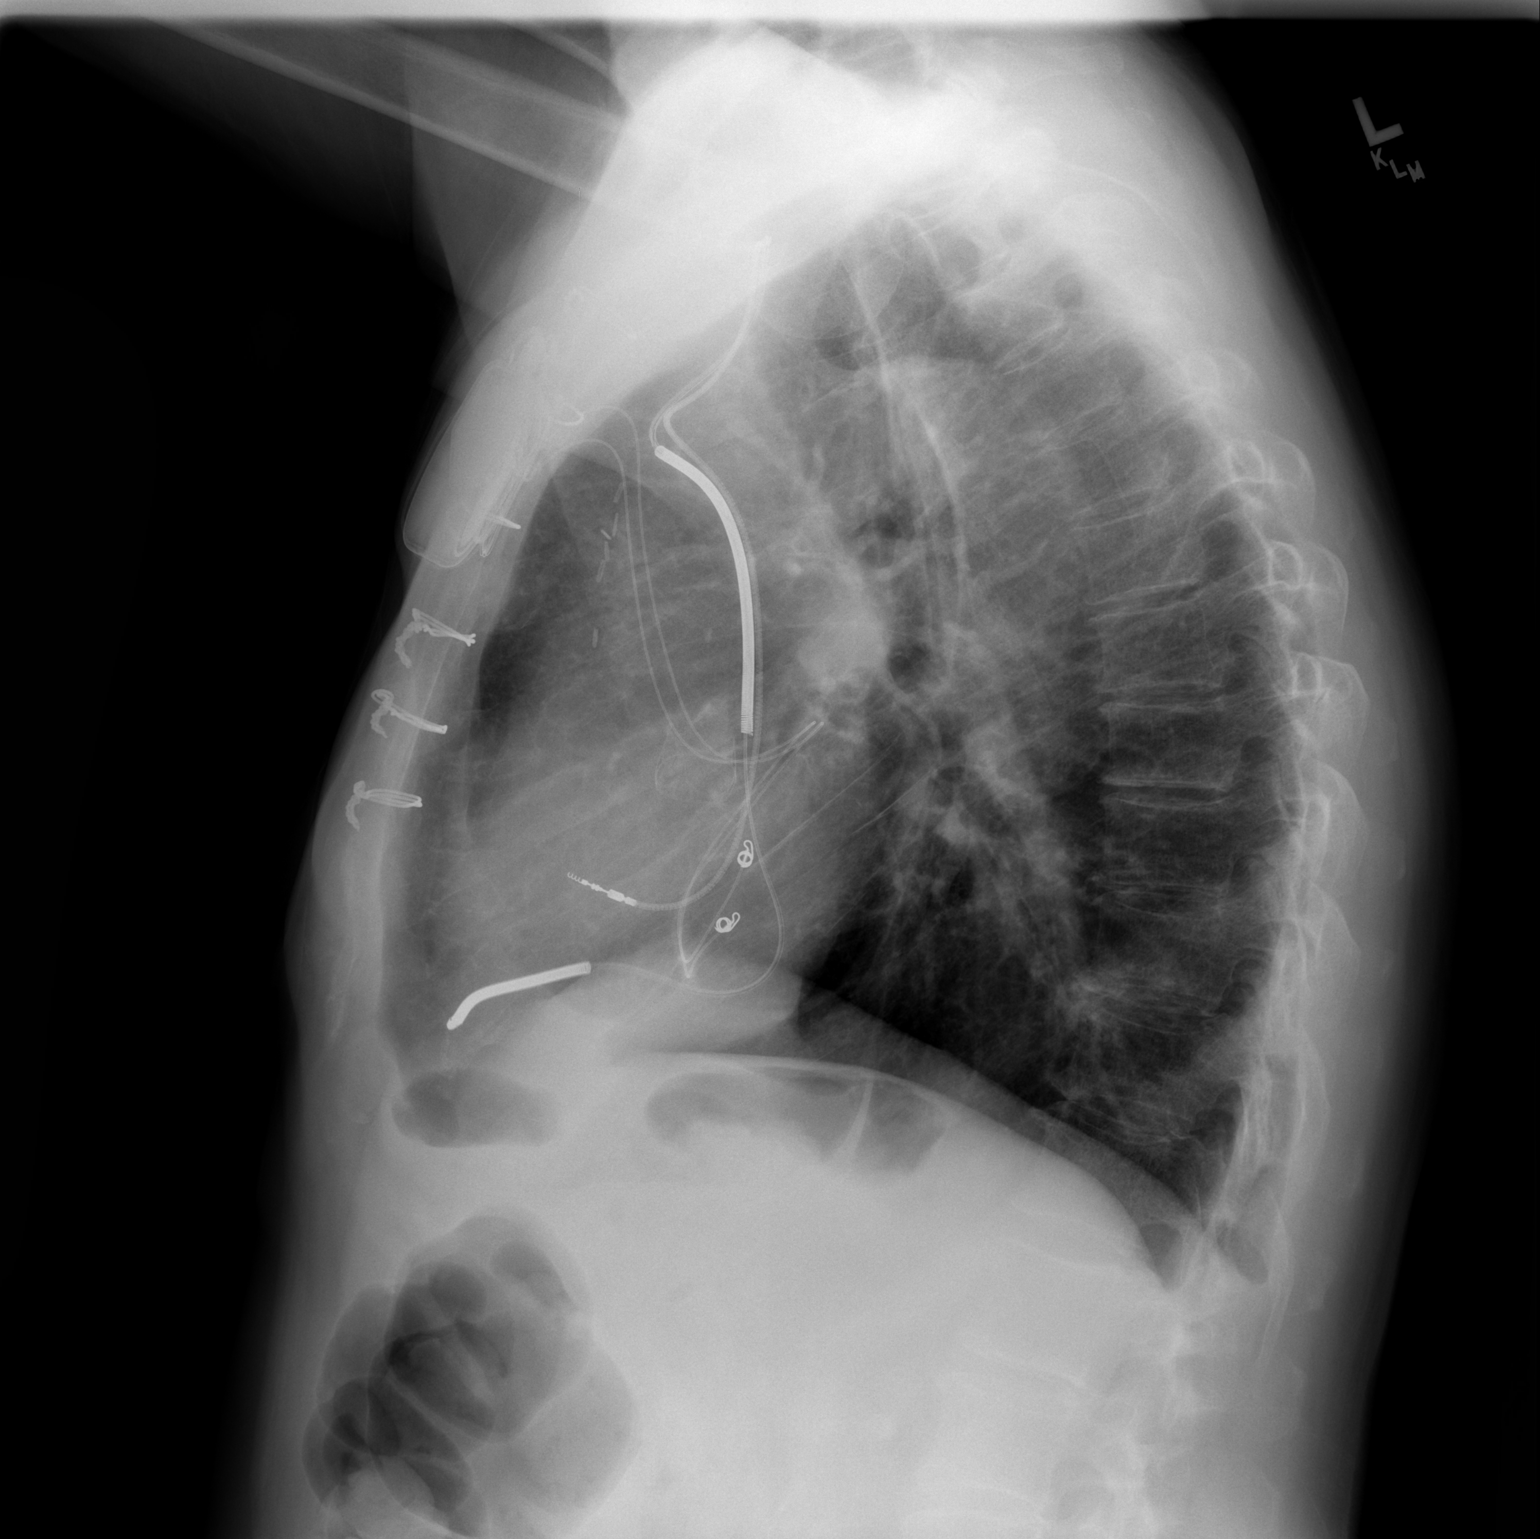

[2 of 2 positions shown; findings below may reference images not displayed]

FINDINGS: No active infiltrate or effusion is seen.  Mediastinal
contours appear stable.  The heart is within upper limits normal
and a permanent pacemaker remains with AICD lead.  There are mild
degenerative changes in the thoracic spine.
IMPRESSION: Stable chest x-ray with pacer and AICD lead.  No active lung
disease.

## 2012-09-26 ENCOUNTER — Telehealth: Payer: Self-pay | Admitting: Family Medicine

## 2012-09-26 NOTE — Telephone Encounter (Signed)
Last seen 08/03/12, next ov scheduled 11/04/12.  Please advise.

## 2012-09-27 MED ORDER — HYDROCORTISONE ACETATE 30 MG RE SUPP: 30.0000 mg | Freq: Two times a day (BID) | RECTAL | Status: DC

## 2012-09-27 NOTE — Telephone Encounter (Signed)
Pt aware and will come into office if no better after treatment.

## 2012-09-27 NOTE — Telephone Encounter (Signed)
OK.  Rx for hydrocortisone suppositories sent to his pharmacy.  Come in if no better after 2 wk course of this.-thx

## 2012-10-13 ENCOUNTER — Encounter: Payer: Medicare Other | Admitting: Internal Medicine

## 2012-10-17 ENCOUNTER — Ambulatory Visit: Payer: Medicare Other | Admitting: Cardiology

## 2012-10-28 ENCOUNTER — Other Ambulatory Visit: Payer: Self-pay | Admitting: Family Medicine

## 2012-10-28 MED ORDER — VENLAFAXINE HCL ER 75 MG PO CP24: ORAL_CAPSULE | ORAL | Status: DC

## 2012-11-03 ENCOUNTER — Other Ambulatory Visit: Payer: Self-pay | Admitting: Family Medicine

## 2012-11-03 NOTE — Telephone Encounter (Signed)
Patient has started taking three effexor daily instead of two daily because he has been really depressed.  Patient also states he is having a lot of pain issues.  Patient didn't actually want anything just wanted to talk.  He has an appointment on Monday 11/07/12 at 2pm.

## 2012-11-04 ENCOUNTER — Ambulatory Visit: Payer: Medicare Other | Admitting: Family Medicine

## 2012-11-07 ENCOUNTER — Ambulatory Visit (INDEPENDENT_AMBULATORY_CARE_PROVIDER_SITE_OTHER): Payer: Medicare Other | Admitting: Family Medicine

## 2012-11-07 ENCOUNTER — Encounter: Payer: Self-pay | Admitting: Family Medicine

## 2012-11-07 VITALS — BP 110/69 | HR 65 | Temp 98.5°F | Resp 16 | Ht 67.0 in | Wt 151.0 lb

## 2012-11-07 DIAGNOSIS — G3184 Mild cognitive impairment, so stated: Secondary | ICD-10-CM

## 2012-11-07 DIAGNOSIS — M25519 Pain in unspecified shoulder: Secondary | ICD-10-CM

## 2012-11-07 DIAGNOSIS — N62 Hypertrophy of breast: Secondary | ICD-10-CM

## 2012-11-07 MED ORDER — MORPHINE SULFATE 15 MG PO TABS: 15.0000 mg | ORAL_TABLET | Freq: Four times a day (QID) | ORAL | Status: DC | PRN
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
## 2012-11-16 ENCOUNTER — Encounter: Payer: Self-pay | Admitting: Cardiology

## 2012-11-16 ENCOUNTER — Ambulatory Visit (INDEPENDENT_AMBULATORY_CARE_PROVIDER_SITE_OTHER): Payer: Medicare Other | Admitting: Cardiology

## 2012-11-16 VITALS — BP 101/65 | HR 73 | Ht 67.0 in | Wt 153.0 lb

## 2012-11-16 DIAGNOSIS — I350 Nonrheumatic aortic (valve) stenosis: Secondary | ICD-10-CM

## 2012-11-16 DIAGNOSIS — I5022 Chronic systolic (congestive) heart failure: Secondary | ICD-10-CM

## 2012-11-16 DIAGNOSIS — R0989 Other specified symptoms and signs involving the circulatory and respiratory systems: Secondary | ICD-10-CM

## 2012-11-16 DIAGNOSIS — I2510 Atherosclerotic heart disease of native coronary artery without angina pectoris: Secondary | ICD-10-CM

## 2012-11-16 DIAGNOSIS — I359 Nonrheumatic aortic valve disorder, unspecified: Secondary | ICD-10-CM

## 2012-11-16 DIAGNOSIS — I714 Abdominal aortic aneurysm, without rupture: Secondary | ICD-10-CM

## 2012-11-16 LAB — BRAIN NATRIURETIC PEPTIDE: Pro B Natriuretic peptide (BNP): 23 pg/mL (ref 0.0–100.0)

## 2012-11-16 LAB — BASIC METABOLIC PANEL
CO2: 26 mEq/L (ref 19–32)
Chloride: 103 mEq/L (ref 96–112)
Glucose, Bld: 77 mg/dL (ref 70–99)
Potassium: 3.9 mEq/L (ref 3.5–5.1)
Sodium: 136 mEq/L (ref 135–145)
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
## 2012-11-16 NOTE — Patient Instructions (Addendum)
Your physician recommends that you return for lab work today--BMET/BNP/ Digoxin level.  Your physician has requested that you have a carotid duplex. This test is an ultrasound of the carotid arteries in your neck. It looks at blood flow through these arteries that supply the brain with blood. Allow one hour for this exam. There are no restrictions or special instructions.  Your physician recommends that you schedule a follow-up appointment in: 4 months with Dr Shirlee Latch.

## 2012-11-17 ENCOUNTER — Other Ambulatory Visit: Payer: Self-pay | Admitting: *Deleted

## 2012-11-17 DIAGNOSIS — R0989 Other specified symptoms and signs involving the circulatory and respiratory systems: Secondary | ICD-10-CM | POA: Insufficient documentation

## 2012-11-17 NOTE — Progress Notes (Signed)
error 

## 2012-11-17 NOTE — Addendum Note (Signed)
Addended by: Jacqlyn Krauss on: 11/17/2012 04:21 PM   Modules accepted: Orders

## 2012-11-18 ENCOUNTER — Telehealth: Payer: Self-pay | Admitting: Family Medicine

## 2012-11-18 DIAGNOSIS — J449 Chronic obstructive pulmonary disease, unspecified: Secondary | ICD-10-CM

## 2012-11-18 MED ORDER — ALBUTEROL SULFATE HFA 108 (90 BASE) MCG/ACT IN AERS: 2.0000 | INHALATION_SPRAY | Freq: Four times a day (QID) | RESPIRATORY_TRACT | Status: DC | PRN

## 2012-11-18 MED ORDER — ALBUTEROL SULFATE (2.5 MG/3ML) 0.083% IN NEBU: 2.5000 mg | INHALATION_SOLUTION | RESPIRATORY_TRACT | Status: DC | PRN

## 2012-11-18 NOTE — Telephone Encounter (Signed)
Patient called wanting his neb solution refilled.  I sent in to [facility] per patient request.

## 2012-11-18 NOTE — Telephone Encounter (Signed)
Sent to pharmacy 

## 2012-12-05 ENCOUNTER — Other Ambulatory Visit: Payer: Self-pay | Admitting: Family Medicine

## 2012-12-05 MED ORDER — VENLAFAXINE HCL ER 75 MG PO CP24: ORAL_CAPSULE | ORAL | Status: DC

## 2012-12-08 ENCOUNTER — Encounter: Payer: Self-pay | Admitting: Family Medicine

## 2012-12-08 ENCOUNTER — Ambulatory Visit (INDEPENDENT_AMBULATORY_CARE_PROVIDER_SITE_OTHER): Payer: Medicare Other | Admitting: Family Medicine

## 2012-12-08 VITALS — BP 99/57 | HR 69 | Temp 98.3°F | Resp 16 | Ht 67.0 in | Wt 152.0 lb

## 2012-12-08 DIAGNOSIS — N62 Hypertrophy of breast: Secondary | ICD-10-CM | POA: Insufficient documentation

## 2012-12-08 DIAGNOSIS — G894 Chronic pain syndrome: Secondary | ICD-10-CM

## 2012-12-08 DIAGNOSIS — Z23 Encounter for immunization: Secondary | ICD-10-CM

## 2012-12-08 DIAGNOSIS — J441 Chronic obstructive pulmonary disease with (acute) exacerbation: Secondary | ICD-10-CM

## 2012-12-08 DIAGNOSIS — G3184 Mild cognitive impairment, so stated: Secondary | ICD-10-CM | POA: Insufficient documentation

## 2012-12-08 DIAGNOSIS — F329 Major depressive disorder, single episode, unspecified: Secondary | ICD-10-CM

## 2012-12-08 MED ORDER — ALBUTEROL SULFATE HFA 108 (90 BASE) MCG/ACT IN AERS: 2.0000 | INHALATION_SPRAY | Freq: Four times a day (QID) | RESPIRATORY_TRACT | Status: DC | PRN

## 2012-12-08 MED ORDER — VENLAFAXINE HCL ER 75 MG PO CP24: ORAL_CAPSULE | ORAL | Status: DC

## 2012-12-08 MED ORDER — CLINDAMYCIN HCL 300 MG PO CAPS: 300.0000 mg | ORAL_CAPSULE | Freq: Three times a day (TID) | ORAL | Status: DC

## 2012-12-08 NOTE — Assessment & Plan Note (Signed)
R>L; pt is s/p multiple steroid injections that have not been of much long term help, sometimes not even short term help. He has osteoarthritis.  He is in pain all the time.  Given question of recent intolerance to his hydroc/apap, I will do a trial of MS IR 15mg , 1 tab q6h prn, #120, no RF.  Therapeutic expectations and side effect profile of medication discussed today.  Patient's questions answered. He'll stop hydro/apap for now. As previously stated, he declines pain clinic referral, citing financial constraints.  At this time I have not committed to treating his chronic pain long term, but if/when I decide that I will do this then we'll get a controlled substance contract signed and in chart.

## 2012-12-08 NOTE — Assessment & Plan Note (Signed)
With resolving URI. Mild--pt looks good here today. He declines prednisone due to hx of itching on this med. I think this is fine to leave off right now. I did rx clindamycin 300 mg tid x 7d.  I recommended he take 2 of these tabs the night before his dental procedure that is planned within the next week.

## 2012-12-08 NOTE — Assessment & Plan Note (Signed)
Osteoarthritis multiple sites, most prominently shoulders, C-spine, L-spine, and hips. Says he can't afford pain management MD referral. I have agreed to manage his chronic pain with narcotic pain meds to the best of my ability, but he understands that if I feel like he MUST see a pain specialist at any time I will refer him and stop managing his pain meds. However, at this time I had him sign a controlled substance contract--scanned into chart. I recommended we start with getting him to take his hydroc/apap REGULARLY : 1-2 tabs po bid.  He is intolerant of morphine and oxycodone. He filled the hydro/apap 12/05/12 and he'll call when he is running low on this. F/u in 1-2 mo.

## 2012-12-08 NOTE — Assessment & Plan Note (Addendum)
Reassured. MMSE 30/30 today. He may have some difficulties secondary to all of his chronic meds + depression.

## 2012-12-08 NOTE — Assessment & Plan Note (Signed)
Suspect secondary to aldactone use. Reassured pt today.
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
## 2012-12-08 NOTE — Assessment & Plan Note (Signed)
Largely situational: poor medical situation, financial problems. He got worse with his attempt to take his effexor XR 75mg  TID instead of bid. He got no response in the past from wellbutrin.  He is too high risk for stimulants, levothyroxine, or lithium as anti-depressant "add-ons".  Atypical antipsychotics would be too cost prohibitive. We agreed to go back down to effexor XR 75mg  1 tab bid.

## 2012-12-28 ENCOUNTER — Ambulatory Visit (HOSPITAL_COMMUNITY): Payer: Medicare Other | Attending: Cardiology

## 2012-12-28 DIAGNOSIS — F17200 Nicotine dependence, unspecified, uncomplicated: Secondary | ICD-10-CM | POA: Insufficient documentation

## 2012-12-28 DIAGNOSIS — I658 Occlusion and stenosis of other precerebral arteries: Secondary | ICD-10-CM | POA: Insufficient documentation

## 2012-12-28 DIAGNOSIS — I350 Nonrheumatic aortic (valve) stenosis: Secondary | ICD-10-CM

## 2012-12-28 DIAGNOSIS — I714 Abdominal aortic aneurysm, without rupture, unspecified: Secondary | ICD-10-CM | POA: Insufficient documentation

## 2012-12-28 DIAGNOSIS — R0989 Other specified symptoms and signs involving the circulatory and respiratory systems: Secondary | ICD-10-CM

## 2012-12-28 DIAGNOSIS — I359 Nonrheumatic aortic valve disorder, unspecified: Secondary | ICD-10-CM

## 2012-12-28 DIAGNOSIS — J4489 Other specified chronic obstructive pulmonary disease: Secondary | ICD-10-CM | POA: Insufficient documentation

## 2012-12-28 DIAGNOSIS — J449 Chronic obstructive pulmonary disease, unspecified: Secondary | ICD-10-CM | POA: Insufficient documentation

## 2012-12-28 DIAGNOSIS — I6529 Occlusion and stenosis of unspecified carotid artery: Secondary | ICD-10-CM

## 2012-12-28 DIAGNOSIS — I2510 Atherosclerotic heart disease of native coronary artery without angina pectoris: Secondary | ICD-10-CM | POA: Insufficient documentation

## 2013-01-06 ENCOUNTER — Telehealth: Payer: Self-pay | Admitting: Family Medicine

## 2013-01-06 MED ORDER — VENLAFAXINE HCL ER 75 MG PO CP24: ORAL_CAPSULE | ORAL | Status: DC

## 2013-01-06 NOTE — Telephone Encounter (Signed)
Patient requesting effexor refill.  Patient last seen on 12/08/12.   Medication last filled 12/08/12 x no refills.  Please advise.

## 2013-01-06 NOTE — Telephone Encounter (Signed)
Effexor rx printed.-thx

## 2013-01-09 ENCOUNTER — Encounter: Payer: Medicare Other | Admitting: Internal Medicine

## 2013-02-03 ENCOUNTER — Emergency Department (HOSPITAL_COMMUNITY)
Admission: EM | Admit: 2013-02-03 | Discharge: 2013-02-03 | Disposition: A | Discharge status: Home / Self Care | Payer: Medicare Other | Attending: Emergency Medicine | Admitting: Emergency Medicine

## 2013-02-03 ENCOUNTER — Emergency Department (HOSPITAL_COMMUNITY): Payer: Medicare Other

## 2013-02-03 ENCOUNTER — Encounter (HOSPITAL_COMMUNITY): Payer: Self-pay | Admitting: Emergency Medicine

## 2013-02-03 DIAGNOSIS — Z8781 Personal history of (healed) traumatic fracture: Secondary | ICD-10-CM | POA: Insufficient documentation

## 2013-02-03 DIAGNOSIS — F3289 Other specified depressive episodes: Secondary | ICD-10-CM | POA: Insufficient documentation

## 2013-02-03 DIAGNOSIS — Z880 Allergy status to penicillin: Secondary | ICD-10-CM | POA: Insufficient documentation

## 2013-02-03 DIAGNOSIS — G8929 Other chronic pain: Secondary | ICD-10-CM | POA: Insufficient documentation

## 2013-02-03 DIAGNOSIS — Z8669 Personal history of other diseases of the nervous system and sense organs: Secondary | ICD-10-CM | POA: Insufficient documentation

## 2013-02-03 DIAGNOSIS — E41 Nutritional marasmus: Secondary | ICD-10-CM | POA: Insufficient documentation

## 2013-02-03 DIAGNOSIS — K219 Gastro-esophageal reflux disease without esophagitis: Secondary | ICD-10-CM | POA: Insufficient documentation

## 2013-02-03 DIAGNOSIS — Z8701 Personal history of pneumonia (recurrent): Secondary | ICD-10-CM | POA: Insufficient documentation

## 2013-02-03 DIAGNOSIS — F329 Major depressive disorder, single episode, unspecified: Secondary | ICD-10-CM | POA: Insufficient documentation

## 2013-02-03 DIAGNOSIS — I2510 Atherosclerotic heart disease of native coronary artery without angina pectoris: Secondary | ICD-10-CM | POA: Insufficient documentation

## 2013-02-03 DIAGNOSIS — M2550 Pain in unspecified joint: Secondary | ICD-10-CM | POA: Insufficient documentation

## 2013-02-03 DIAGNOSIS — Z79899 Other long term (current) drug therapy: Secondary | ICD-10-CM | POA: Insufficient documentation

## 2013-02-03 DIAGNOSIS — Z7982 Long term (current) use of aspirin: Secondary | ICD-10-CM | POA: Insufficient documentation

## 2013-02-03 DIAGNOSIS — I252 Old myocardial infarction: Secondary | ICD-10-CM | POA: Insufficient documentation

## 2013-02-03 DIAGNOSIS — Z9861 Coronary angioplasty status: Secondary | ICD-10-CM | POA: Insufficient documentation

## 2013-02-03 DIAGNOSIS — J441 Chronic obstructive pulmonary disease with (acute) exacerbation: Secondary | ICD-10-CM | POA: Insufficient documentation

## 2013-02-03 DIAGNOSIS — R110 Nausea: Secondary | ICD-10-CM | POA: Insufficient documentation

## 2013-02-03 DIAGNOSIS — R109 Unspecified abdominal pain: Secondary | ICD-10-CM | POA: Insufficient documentation

## 2013-02-03 DIAGNOSIS — R011 Cardiac murmur, unspecified: Secondary | ICD-10-CM | POA: Insufficient documentation

## 2013-02-03 DIAGNOSIS — Z950 Presence of cardiac pacemaker: Secondary | ICD-10-CM | POA: Insufficient documentation

## 2013-02-03 DIAGNOSIS — F17200 Nicotine dependence, unspecified, uncomplicated: Secondary | ICD-10-CM | POA: Insufficient documentation

## 2013-02-03 DIAGNOSIS — R079 Chest pain, unspecified: Secondary | ICD-10-CM | POA: Insufficient documentation

## 2013-02-03 DIAGNOSIS — Z95810 Presence of automatic (implantable) cardiac defibrillator: Secondary | ICD-10-CM | POA: Insufficient documentation

## 2013-02-03 LAB — POCT I-STAT TROPONIN I: Troponin i, poc: 0.01 ng/mL (ref 0.00–0.08)

## 2013-02-03 LAB — BASIC METABOLIC PANEL
BUN: 10 mg/dL (ref 6–23)
Calcium: 9.6 mg/dL (ref 8.4–10.5)
GFR calc Af Amer: 60 mL/min — ABNORMAL LOW (ref >90)
GFR calc non Af Amer: 52 mL/min — ABNORMAL LOW (ref >90)
Potassium: 3.8 mEq/L (ref 3.5–5.1)
Sodium: 137 mEq/L (ref 135–145)

## 2013-02-03 LAB — CBC
MCH: 32.4 pg (ref 26.0–34.0)
MCHC: 34.7 g/dL (ref 30.0–36.0)
Platelets: 243 10*3/uL (ref 150–400)
RDW: 13.1 % (ref 11.5–15.5)

## 2013-02-03 MED ORDER — ONDANSETRON HCL 4 MG/2ML IJ SOLN
4.0000 mg | Freq: Once | INTRAMUSCULAR | Status: AC
  Administered 2013-02-03: 4 mg via INTRAVENOUS
  Filled 2013-02-03: qty 2

## 2013-02-03 NOTE — ED Notes (Signed)
Pt assessed, denies chest pain.  Pt wife at bedside.

## 2013-02-03 NOTE — ED Notes (Signed)
Pt vomited 400 ml of dark brown fluid.  Pt states that he feels better following and did not want any medication for nausea at this time.  MD made aware
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
## 2013-02-03 NOTE — ED Notes (Signed)
Pt stated that he is pain free following his episode of vomiting.

## 2013-02-03 NOTE — ED Notes (Addendum)
Pt. reports low chest pain onset this evening , slight SOB and nausea , denies emesis or diaphoresis . Pt. has a pacemaker / defibrillator .

## 2013-02-03 NOTE — ED Provider Notes (Signed)
7:15 AM Accepted care from Dr. Rubin Payor. 46M w/ hx of CAD, non-contrib cath in 2013 who pw lower chest/epig pain and nausea since midnight last night. Asx after emesis here. Dr. Rubin Payor recommending delta trop and d/c if pt continues to appear well.     9:07 AM: Pt remains asx. Delta trop neg.  I have discussed the diagnosis/risks/treatment options with the patient and believe the pt to be eligible for discharge home to follow-up with his cardiologist next week. We also discussed returning to the ED immediately if new or worsening sx occur. We discussed the sx which are most concerning (e.g., return of pain, vomiting, sob) that necessitate immediate return. Any new prescriptions provided to the patient are listed below.  New Prescriptions   No medications on file   Clinical Impression 1. Chest pain   2. Nausea      Junius Argyle, MD 02/04/13 [phone]

## 2013-02-09 ENCOUNTER — Ambulatory Visit (INDEPENDENT_AMBULATORY_CARE_PROVIDER_SITE_OTHER): Payer: Medicare Other | Admitting: Family Medicine

## 2013-02-09 ENCOUNTER — Encounter: Payer: Self-pay | Admitting: Family Medicine

## 2013-02-09 VITALS — BP 130/71 | HR 65 | Temp 98.6°F | Resp 18 | Ht 67.0 in | Wt 154.0 lb

## 2013-02-09 DIAGNOSIS — R0789 Other chest pain: Secondary | ICD-10-CM

## 2013-02-09 DIAGNOSIS — J4489 Other specified chronic obstructive pulmonary disease: Secondary | ICD-10-CM

## 2013-02-09 DIAGNOSIS — J449 Chronic obstructive pulmonary disease, unspecified: Secondary | ICD-10-CM

## 2013-02-09 DIAGNOSIS — G894 Chronic pain syndrome: Secondary | ICD-10-CM

## 2013-02-09 MED ORDER — HYDROCODONE-ACETAMINOPHEN 10-325 MG PO TABS: ORAL_TABLET | ORAL | Status: DC

## 2013-02-09 MED ORDER — HYDROCODONE-ACETAMINOPHEN 10-325 MG PO TABS: 1.0000 | ORAL_TABLET | ORAL | Status: DC | PRN

## 2013-02-09 MED ORDER — OMEPRAZOLE 20 MG PO CPDR: 20.0000 mg | DELAYED_RELEASE_CAPSULE | Freq: Two times a day (BID) | ORAL | Status: DC
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
## 2013-02-09 NOTE — Assessment & Plan Note (Signed)
Seemed to be GI etiology.  Resolved s/p emesis x 2 and has not recurred. Continue current cardiac meds + omeprazole 20mg  bid.

## 2013-02-09 NOTE — Assessment & Plan Note (Signed)
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 Controlled substance contract in chart.   He is doing better since taking his hydro/apap 10/325 on more of a scheduled basis vs prn. I gave rx today for #120, plus a rx for Dec '14 and for Jan 2015, each with appropriate fill on/after dating.

## 2013-02-09 NOTE — Assessment & Plan Note (Signed)
Stable currently.  Encouraged complete smoking cessation but he doesn't seem able to completely kick the habit. Continue albuterol HFA 2 puffs q4h prn.

## 2013-02-16 ENCOUNTER — Other Ambulatory Visit: Payer: Self-pay | Admitting: Family Medicine

## 2013-02-16 ENCOUNTER — Telehealth: Payer: Self-pay | Admitting: Family Medicine

## 2013-02-16 MED ORDER — CLINDAMYCIN HCL 300 MG PO CAPS: 300.0000 mg | ORAL_CAPSULE | Freq: Three times a day (TID) | ORAL | Status: DC

## 2013-02-16 NOTE — Telephone Encounter (Signed)
Patient forgot to mention in last OV that he has an infected tooth. He cannot afford to have it pulled right now. Can a Rx be called to [facility] in [place]?

## 2013-02-16 NOTE — Telephone Encounter (Signed)
Clindamycin rx sent to his pharmacy.

## 2013-02-16 NOTE — Telephone Encounter (Signed)
Please advise 

## 2013-02-17 NOTE — Telephone Encounter (Signed)
Patient aware.

## 2013-03-13 ENCOUNTER — Other Ambulatory Visit: Payer: Self-pay

## 2013-03-13 MED ORDER — DIGOXIN 125 MCG PO TABS: 0.0625 mg | ORAL_TABLET | Freq: Every day | ORAL | Status: DC

## 2013-03-16 ENCOUNTER — Encounter: Payer: Self-pay | Admitting: Cardiology

## 2013-03-16 ENCOUNTER — Ambulatory Visit (INDEPENDENT_AMBULATORY_CARE_PROVIDER_SITE_OTHER): Payer: Medicare Other | Admitting: Cardiology

## 2013-03-16 VITALS — BP 118/66 | HR 69 | Ht 66.0 in | Wt 153.0 lb

## 2013-03-16 DIAGNOSIS — I359 Nonrheumatic aortic valve disorder, unspecified: Secondary | ICD-10-CM

## 2013-03-16 DIAGNOSIS — I2510 Atherosclerotic heart disease of native coronary artery without angina pectoris: Secondary | ICD-10-CM

## 2013-03-16 DIAGNOSIS — I714 Abdominal aortic aneurysm, without rupture: Secondary | ICD-10-CM

## 2013-03-16 DIAGNOSIS — E785 Hyperlipidemia, unspecified: Secondary | ICD-10-CM

## 2013-03-16 DIAGNOSIS — I5022 Chronic systolic (congestive) heart failure: Secondary | ICD-10-CM

## 2013-03-16 DIAGNOSIS — R0989 Other specified symptoms and signs involving the circulatory and respiratory systems: Secondary | ICD-10-CM

## 2013-03-16 LAB — BASIC METABOLIC PANEL
BUN: 10 mg/dL (ref 6–23)
Chloride: 104 mEq/L (ref 96–112)
Creatinine, Ser: 1.2 mg/dL (ref 0.4–1.5)
GFR: 62.87 mL/min (ref >60.00)
Sodium: 139 mEq/L (ref 135–145)

## 2013-03-16 LAB — LIPID PANEL
Cholesterol: 156 mg/dL (ref 0–200)
HDL: 32.1 mg/dL — ABNORMAL LOW (ref >39.00)
Total CHOL/HDL Ratio: 5
VLDL: 54.2 mg/dL — ABNORMAL HIGH (ref 0.0–40.0)

## 2013-03-16 LAB — LDL CHOLESTEROL, DIRECT: Direct LDL: 93.4 mg/dL

## 2013-03-16 NOTE — Patient Instructions (Signed)
Your physician recommends that you have  lab work today--BMET/Lipid profile  Your physician recommends that you schedule a follow-up appointment in: 3 months with Dr Shirlee Latch in the [facility] at [facility].
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
## 2013-03-20 ENCOUNTER — Telehealth: Payer: Self-pay | Admitting: *Deleted

## 2013-03-20 NOTE — Telephone Encounter (Signed)
Message copied by Barrie Folk on Mon Mar 20, 2013 10:27 AM ------      Message from: Laurey Morale      Created: Sun Mar 19, 2013  8:48 PM       LDL a bit high, would increase Crestor to 10 mg daily with lipids/LFTs in 2 months. ------

## 2013-03-20 NOTE — Telephone Encounter (Signed)
LMOVM to increase Crestor to 10mg  daily with repeat labs in 2 months. Mylo Red RN

## 2013-03-21 ENCOUNTER — Telehealth: Payer: Self-pay | Admitting: Family Medicine

## 2013-03-21 MED ORDER — DIAZEPAM 10 MG PO TABS: ORAL_TABLET | ORAL | Status: DC

## 2013-03-21 MED ORDER — ZOLPIDEM TARTRATE 5 MG PO TABS: 5.0000 mg | ORAL_TABLET | Freq: Every evening | ORAL | Status: DC | PRN

## 2013-03-21 NOTE — Telephone Encounter (Signed)
Also got a request to refill diazepam.  Last printed 08/29/12 x 5 refills.  Please advise.

## 2013-03-21 NOTE — Telephone Encounter (Signed)
Pharmacy request for [place].  Patient last seen in our office 02/09/13.  Last rx looks like it was given on 02/03/13 but I don't think you prescribed it.  Please advise refill.

## 2013-03-21 NOTE — Telephone Encounter (Signed)
Ambien and diazepam rx's printed.

## 2013-03-22 NOTE — Telephone Encounter (Signed)
Rx's faxed to pharmacy with confirmation.

## 2013-04-05 ENCOUNTER — Other Ambulatory Visit: Payer: Self-pay | Admitting: *Deleted

## 2013-04-05 DIAGNOSIS — E785 Hyperlipidemia, unspecified: Secondary | ICD-10-CM

## 2013-04-10 ENCOUNTER — Telehealth: Payer: Self-pay | Admitting: Family Medicine

## 2013-04-10 NOTE — Telephone Encounter (Signed)
Please advise 

## 2013-04-10 NOTE — Telephone Encounter (Signed)
Patient has stopped taking the Trazodone & Zolpidem. He has been feeling depressed & he has an odd taste in his mouth which has decreased his appetite. He still can't sleep. Is there anything else he can take?

## 2013-04-17 ENCOUNTER — Other Ambulatory Visit: Payer: Self-pay

## 2013-04-17 MED ORDER — DIGOXIN 125 MCG PO TABS: 0.0625 mg | ORAL_TABLET | Freq: Every day | ORAL | Status: DC

## 2013-04-17 MED ORDER — SPIRONOLACTONE 25 MG PO TABS: 25.0000 mg | ORAL_TABLET | Freq: Every day | ORAL | Status: DC

## 2013-04-17 MED ORDER — GABAPENTIN 300 MG PO CAPS: ORAL_CAPSULE | ORAL | Status: DC

## 2013-04-17 NOTE — Telephone Encounter (Signed)
Reviewed record, past insomnia meds, called pt today. He admits he is still depressed. Also feels like ever since Crestor was increased a month ago that his diazepam, vicodin, zolpidem, and trazodone "don't agree with me anymore".  He could not be more specific about what this means, but denied myalgias.  In fact, he said his pain in shoulders/neck has been less of a bother lately. I decided to have him stay off of zolpidem and trazodone and have him start neurontin 300mg , 1-2 tabs po qhs. Appropriate titration of this med was discussed with pt today on phone.

## 2013-04-19 ENCOUNTER — Other Ambulatory Visit: Payer: Self-pay

## 2013-04-19 MED ORDER — DIGOXIN 125 MCG PO TABS: 0.0625 mg | ORAL_TABLET | Freq: Every day | ORAL | Status: DC

## 2013-04-19 MED ORDER — SPIRONOLACTONE 25 MG PO TABS: 25.0000 mg | ORAL_TABLET | Freq: Every day | ORAL | Status: DC

## 2013-05-04 ENCOUNTER — Ambulatory Visit (INDEPENDENT_AMBULATORY_CARE_PROVIDER_SITE_OTHER): Payer: Medicare Other | Admitting: Internal Medicine

## 2013-05-04 ENCOUNTER — Encounter: Payer: Self-pay | Admitting: Internal Medicine

## 2013-05-04 ENCOUNTER — Telehealth: Payer: Self-pay | Admitting: Cardiology

## 2013-05-04 VITALS — BP 104/58 | HR 64 | Ht 66.0 in | Wt 156.0 lb

## 2013-05-04 DIAGNOSIS — I5022 Chronic systolic (congestive) heart failure: Secondary | ICD-10-CM

## 2013-05-04 DIAGNOSIS — Z950 Presence of cardiac pacemaker: Secondary | ICD-10-CM

## 2013-05-04 DIAGNOSIS — F17200 Nicotine dependence, unspecified, uncomplicated: Secondary | ICD-10-CM

## 2013-05-04 DIAGNOSIS — I428 Other cardiomyopathies: Secondary | ICD-10-CM

## 2013-05-04 LAB — MDC_IDC_ENUM_SESS_TYPE_INCLINIC
Battery Voltage: 3.1 V
Brady Statistic AP VS Percent: 0.02 %
Brady Statistic AS VP Percent: 98.63 %
Brady Statistic AS VS Percent: 0.05 %
Brady Statistic RA Percent Paced: 1.33 %
HIGH POWER IMPEDANCE MEASURED VALUE: 209 Ohm
HIGH POWER IMPEDANCE MEASURED VALUE: 49 Ohm
HighPow Impedance: 437 Ohm
HighPow Impedance: 63 Ohm
Lead Channel Impedance Value: 4047 Ohm
Lead Channel Impedance Value: 437 Ohm
Lead Channel Impedance Value: 494 Ohm
Lead Channel Pacing Threshold Amplitude: 0.875 V
Lead Channel Pacing Threshold Pulse Width: 0.4 ms
Lead Channel Pacing Threshold Pulse Width: 0.5 ms
Lead Channel Sensing Intrinsic Amplitude: 15.75 mV
Lead Channel Sensing Intrinsic Amplitude: 17.625 mV
Lead Channel Sensing Intrinsic Amplitude: 2.5 mV
Lead Channel Setting Pacing Amplitude: 1.75 V
Lead Channel Setting Pacing Amplitude: 2 V
Lead Channel Setting Pacing Amplitude: 2.5 V
Lead Channel Setting Pacing Pulse Width: 0.4 ms
Lead Channel Setting Pacing Pulse Width: 0.5 ms
MDC IDC MSMT LEADCHNL LV IMPEDANCE VALUE: 4047 Ohm
MDC IDC MSMT LEADCHNL RA IMPEDANCE VALUE: 570 Ohm
MDC IDC MSMT LEADCHNL RA PACING THRESHOLD AMPLITUDE: 0.875 V
MDC IDC MSMT LEADCHNL RA PACING THRESHOLD PULSEWIDTH: 0.4 ms
MDC IDC MSMT LEADCHNL RA SENSING INTR AMPL: 2.625 mV
MDC IDC MSMT LEADCHNL RV PACING THRESHOLD AMPLITUDE: 0.75 V
MDC IDC SESS DTM: 20150205170859
MDC IDC SET LEADCHNL RV SENSING SENSITIVITY: 0.3 mV
MDC IDC SET ZONE DETECTION INTERVAL: 250 ms
MDC IDC STAT BRADY AP VP PERCENT: 1.3 %
MDC IDC STAT BRADY RV PERCENT PACED: 99.93 %
Zone Setting Detection Interval: 300 ms
Zone Setting Detection Interval: 350 ms
Zone Setting Detection Interval: 450 ms

## 2013-05-04 NOTE — Assessment & Plan Note (Signed)
Euvolemic. Will continue current medications; blood work as expected the next couple of weeks   digoxin level measured

## 2013-05-04 NOTE — Assessment & Plan Note (Signed)
The patient's device was interrogated.  The information was reviewed. No changes were made in the programming.    

## 2013-05-04 NOTE — Telephone Encounter (Signed)
error 
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
## 2013-05-04 NOTE — Patient Instructions (Addendum)
Your physician recommends that you continue on your current medications as directed. Please refer to the Current Medication list given to you today.  Your physician recommends that you schedule a follow-up appointment in: March with Dr Shirlee Latch in the [facility] at [facility].   Remote monitoring is used to monitor your Pacemaker of ICD from home. This monitoring reduces the number of office visits required to check your device to one time per year. It allows [place] to keep an eye on the functioning of your device to ensure it is working properly. You are scheduled for a device check from home on 08/07/13. You may send your transmission at any time that day. If you have a wireless device, the transmission will be sent automatically. After your physician reviews your transmission, you will receive a postcard with your next transmission date.  Your physician wants you to follow-up in: 1 year with Dr. Graciela Husbands.  You will receive a reminder letter in the mail two months in advance. If you don't receive a letter, please call our office to schedule the follow-up appointment.

## 2013-05-04 NOTE — Assessment & Plan Note (Signed)
Admonished to stop smoking

## 2013-05-25 ENCOUNTER — Other Ambulatory Visit: Payer: Medicare Other

## 2013-05-26 ENCOUNTER — Other Ambulatory Visit (INDEPENDENT_AMBULATORY_CARE_PROVIDER_SITE_OTHER): Payer: Medicare Other

## 2013-05-26 DIAGNOSIS — E785 Hyperlipidemia, unspecified: Secondary | ICD-10-CM

## 2013-05-26 LAB — HEPATIC FUNCTION PANEL
ALT: 15 U/L (ref 0–53)
AST: 15 U/L (ref 0–37)
Albumin: 3.7 g/dL (ref 3.5–5.2)
Alkaline Phosphatase: 81 U/L (ref 39–117)
BILIRUBIN TOTAL: 0.4 mg/dL (ref 0.3–1.2)
Bilirubin, Direct: 0 mg/dL (ref 0.0–0.3)
Total Protein: 7.2 g/dL (ref 6.0–8.3)

## 2013-05-26 LAB — LIPID PANEL
CHOL/HDL RATIO: 4
Cholesterol: 137 mg/dL (ref 0–200)
HDL: 37.9 mg/dL — AB (ref >39.00)
Triglycerides: 213 mg/dL — ABNORMAL HIGH (ref 0.0–149.0)
VLDL: 42.6 mg/dL — ABNORMAL HIGH (ref 0.0–40.0)

## 2013-05-26 LAB — LDL CHOLESTEROL, DIRECT: Direct LDL: 74.6 mg/dL

## 2013-05-31 ENCOUNTER — Ambulatory Visit (HOSPITAL_COMMUNITY)
Admission: RE | Admit: 2013-05-31 | Discharge: 2013-05-31 | Disposition: A | Discharge status: Home / Self Care | Payer: Medicare Other | Source: Ambulatory Visit | Attending: Cardiology | Admitting: Cardiology

## 2013-05-31 VITALS — BP 92/58 | HR 88 | Wt 160.5 lb

## 2013-05-31 DIAGNOSIS — E785 Hyperlipidemia, unspecified: Secondary | ICD-10-CM

## 2013-05-31 DIAGNOSIS — F17200 Nicotine dependence, unspecified, uncomplicated: Secondary | ICD-10-CM | POA: Insufficient documentation

## 2013-05-31 DIAGNOSIS — I714 Abdominal aortic aneurysm, without rupture, unspecified: Secondary | ICD-10-CM | POA: Insufficient documentation

## 2013-05-31 DIAGNOSIS — I359 Nonrheumatic aortic valve disorder, unspecified: Secondary | ICD-10-CM

## 2013-05-31 DIAGNOSIS — E782 Mixed hyperlipidemia: Secondary | ICD-10-CM | POA: Insufficient documentation

## 2013-05-31 DIAGNOSIS — I5022 Chronic systolic (congestive) heart failure: Secondary | ICD-10-CM | POA: Insufficient documentation

## 2013-05-31 DIAGNOSIS — I2510 Atherosclerotic heart disease of native coronary artery without angina pectoris: Secondary | ICD-10-CM | POA: Insufficient documentation

## 2013-05-31 LAB — BASIC METABOLIC PANEL
BUN: 8 mg/dL (ref 6–23)
CHLORIDE: 101 meq/L (ref 96–112)
CO2: 24 meq/L (ref 19–32)
Calcium: 9.5 mg/dL (ref 8.4–10.5)
Creatinine, Ser: 1.21 mg/dL (ref 0.50–1.35)
GFR calc Af Amer: 70 mL/min — ABNORMAL LOW (ref >90)
GFR calc non Af Amer: 60 mL/min — ABNORMAL LOW (ref >90)
GLUCOSE: 114 mg/dL — AB (ref 70–99)
Potassium: 4.1 mEq/L (ref 3.7–5.3)
SODIUM: 140 meq/L (ref 137–147)

## 2013-05-31 LAB — DIGOXIN LEVEL: Digoxin Level: 0.6 ng/mL — ABNORMAL LOW (ref 0.8–2.0)

## 2013-05-31 MED ORDER — RAMIPRIL 5 MG PO CAPS: 5.0000 mg | ORAL_CAPSULE | Freq: Every day | ORAL | Status: DC

## 2013-05-31 MED ORDER — CARVEDILOL 12.5 MG PO TABS: 12.5000 mg | ORAL_TABLET | Freq: Two times a day (BID) | ORAL | Status: DC
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
## 2013-05-31 NOTE — Patient Instructions (Signed)
Labs today  We will contact you in 4 months to schedule your next appointment.  

## 2013-06-08 ENCOUNTER — Encounter: Payer: Self-pay | Admitting: Family Medicine

## 2013-06-08 ENCOUNTER — Ambulatory Visit (INDEPENDENT_AMBULATORY_CARE_PROVIDER_SITE_OTHER): Payer: Medicare Other | Admitting: Family Medicine

## 2013-06-08 VITALS — BP 95/60 | HR 68 | Temp 98.8°F | Resp 18 | Ht 67.0 in | Wt 158.0 lb

## 2013-06-08 DIAGNOSIS — N183 Chronic kidney disease, stage 3 unspecified: Secondary | ICD-10-CM

## 2013-06-08 DIAGNOSIS — G4700 Insomnia, unspecified: Secondary | ICD-10-CM

## 2013-06-08 DIAGNOSIS — I428 Other cardiomyopathies: Secondary | ICD-10-CM

## 2013-06-08 DIAGNOSIS — I429 Cardiomyopathy, unspecified: Secondary | ICD-10-CM

## 2013-06-08 DIAGNOSIS — F3289 Other specified depressive episodes: Secondary | ICD-10-CM

## 2013-06-08 DIAGNOSIS — G894 Chronic pain syndrome: Secondary | ICD-10-CM

## 2013-06-08 DIAGNOSIS — I714 Abdominal aortic aneurysm, without rupture, unspecified: Secondary | ICD-10-CM

## 2013-06-08 DIAGNOSIS — J449 Chronic obstructive pulmonary disease, unspecified: Secondary | ICD-10-CM

## 2013-06-08 DIAGNOSIS — F329 Major depressive disorder, single episode, unspecified: Secondary | ICD-10-CM

## 2013-06-08 MED ORDER — ALBUTEROL SULFATE (2.5 MG/3ML) 0.083% IN NEBU: INHALATION_SOLUTION | RESPIRATORY_TRACT | Status: DC

## 2013-06-08 MED ORDER — ALBUTEROL SULFATE HFA 108 (90 BASE) MCG/ACT IN AERS: 2.0000 | INHALATION_SPRAY | Freq: Four times a day (QID) | RESPIRATORY_TRACT | Status: DC | PRN

## 2013-06-08 MED ORDER — OMEPRAZOLE 40 MG PO CPDR: 40.0000 mg | DELAYED_RELEASE_CAPSULE | Freq: Every day | ORAL | Status: DC

## 2013-06-08 MED ORDER — VENLAFAXINE HCL ER 75 MG PO CP24: 75.0000 mg | ORAL_CAPSULE | Freq: Three times a day (TID) | ORAL | Status: DC

## 2013-06-08 NOTE — Progress Notes (Signed)
Pre visit review using our clinic review tool, if applicable. No additional management support is needed unless otherwise documented below in the visit note. 

## 2013-06-08 NOTE — Progress Notes (Signed)
OFFICE NOTE  06/08/2013  CC:  Chief Complaint  Patient presents with  . Follow-up    4 months  . Medication Problem    discuss omeprazole Rx and effexor     HPI: Patient is a 68 y.o. Caucasian male who is here for 4 mo f/u chronic med probs/polypharmacy. Pt with hx of ischemic and nonischemic cardiomyopathy, bioprosthetic AVR for severe aortic stenosis, s/p ICD placement, COPD, chronic depression/dysthymia, anxiety, GERD, and chronic insomnia.  F/u in the last 1-2 mo's with his cardiology MD's went fine; no changes by Dr. Graciela Husbands. Dr. Shirlee Latch increased crestor to 10mg  qd and pt noted worsening of myalgias/arthralgias but has maintained compliance with 10mg  qd dosing. F/u aortic u/s to be done 06/2013 for AAA.   He takes omeprazole 20mg  bid but rx needs to be changed to 40mg  once daily.  Takes effexor 2-3 tabs every day. He has not been taking his pain ills very regularly lately.  Had fever and signif resp illness last week and still with lingering upper and lower resp sx's now but is better. Still smoking 10 cigs/day.   Pertinent PMH:  PMH extensive--reviewed/updated.  Past Surgical History  Procedure Laterality Date  . Eye surgery  4    age 60; strabismus  . Aortic valve replacement  10/07/2010    Bioprosthetic valve  . Insert / replace / remove pacemaker  07/08/11    "and AICD""  . Cardiac valve replacement    . Fracture surgery  1993    "broke left heelbone loose from my ankle"  . Tonsillectomy and adenoidectomy  1956  . Cardiovascular stress test    . Transesophageal echocardiogram    . Cardiac catheterization      Repeat cath (after AVR) 01/29/12 showed no significant CAD and EF 55%., righ t heart   . Thoracotomy  10/21/2011    Procedure: THORACOTOMY MAJOR;  Surgeon: Alleen Borne, MD;  Location: [facility] OR;  Service: Thoracic;  Laterality: Left;    MEDS:  Outpatient Prescriptions Prior to Visit  Medication Sig Dispense Refill  . aspirin EC 81 MG tablet Take 81 mg  by mouth daily.      . carvedilol (COREG) 12.5 MG tablet Take 1 tablet (12.5 mg total) by mouth 2 (two) times daily.  180 tablet  3  . Coenzyme Q10 50 MG CAPS Take 50 mg by mouth daily.      . diazepam (VALIUM) 10 MG tablet TAKE ONE TABLET BY MOUTH THREE TIMES DAILY AS NEEDED FOR ANXIETY  90 tablet  5  . digoxin (LANOXIN) 0.125 MG tablet Take 0.5 tablets (0.0625 mg total) by mouth daily.  90 tablet  1  . HYDROcodone-acetaminophen (NORCO) 10-325 MG per tablet 1 tab po qid prn  120 tablet  0  . ramipril (ALTACE) 5 MG capsule Take 1 capsule (5 mg total) by mouth daily.  90 capsule  3  . rosuvastatin (CRESTOR) 10 MG tablet Take 1 tablet (10 mg total) by mouth daily.      [name] spironolactone (ALDACTONE) 25 MG tablet Take 1 tablet (25 mg total) by mouth daily.  90 tablet  1  . traZODone (DESYREL) 50 MG tablet Take 1 tablet (50 mg total) by mouth at bedtime as needed. For insomnia.  30 tablet  5  . zolpidem (AMBIEN) 5 MG tablet Take 1 tablet (5 mg total) by mouth at bedtime as needed for sleep.  30 tablet  5  . albuterol (PROVENTIL HFA;VENTOLIN HFA) 108 (90 BASE) MCG/ACT inhaler  Inhale 2 puffs into the lungs every 6 (six) hours as needed for wheezing.  1 Inhaler  0  . albuterol (PROVENTIL) (2.5 MG/3ML) 0.083% nebulizer solution Take 3 mLs (2.5 mg total) by nebulization every 4 (four) hours as needed for wheezing. DX: 496  75 mL  1  . omeprazole (PRILOSEC) 20 MG capsule Take 1 capsule (20 mg total) by mouth 2 (two) times daily.  180 capsule  2  . venlafaxine XR (EFFEXOR-XR) 75 MG 24 hr capsule Take 75 mg by mouth 3 (three) times daily.        No facility-administered medications prior to visit.    PE: Blood pressure 95/60, pulse 68, temperature 98.8 F (37.1 C), temperature source Temporal, resp. rate 18, height 5\' 7"  (1.702 m), weight 158 lb (71.668 kg), SpO2 97.00%. Gen: alert, tired but not acutely ill-appearing.  Oriented x 4. ZOX:WRUEENT:Eyes: no injection, icteris, swelling, or exudate.  EOMI,  PERRLA. Mouth: lips without lesion/swelling.  Oral mucosa pink and moist. Oropharynx without erythema, exudate, or swelling.  CV: RRR, no m/r/g LUNGS: CTA on inspiration bilat, with prolonged expiratory phase and a few scattered coarse wheezes.  Good aeration, nonlabored resps.  LABS: none today   Chemistry      Component Value Date/Time   NA 140 05/31/2013 1348   K 4.1 05/31/2013 1348   CL 101 05/31/2013 1348   CO2 24 05/31/2013 1348   BUN 8 05/31/2013 1348   CREATININE 1.21 05/31/2013 1348   CREATININE TEST NOT PERFORMED 07/13/2012 1250      Component Value Date/Time   CALCIUM 9.5 05/31/2013 1348   ALKPHOS 81 05/26/2013 1341   AST 15 05/26/2013 1341   ALT 15 05/26/2013 1341   BILITOT 0.4 05/26/2013 1341     Lab Results  Component Value Date   CHOL 137 05/26/2013   HDL 37.90* 05/26/2013   LDLCALC 94 07/13/2012   LDLDIRECT 74.6 05/26/2013   TRIG 213.0* 05/26/2013   CHOLHDL 4 05/26/2013     IMPRESSION AND PLAN:  1) COPD, recovering fine from recent mild flare with URI. No new meds recommended except I gave him samples of spireva to restart (2 samples given). Albuterol nebs/HFA refilled. Encouraged total smoking cessation.  2) GERD: stable on omeprazole 20mg  bid. For insurer purposes and pt convenience, will change to one 40mg  tab once qAM.  3) Depression/anxiety/insomnia: stable.  No med changes today. RF'd effexor.  4) Hx of bioprosthetic AVR, cardiomyopathy, s/p ICD placement: all stable. Continue approp cardiology f/u appt's.  5) Hyperlipidemia: recent increase in crestor from 5 to 10mg  assoc with worsened body pain per pt, so I gave 2 weeks' worth of the 5mg  crestor tabs and told him to take this dose and see if his pain did revert back to baseline. Of note, last lipid panel did show that his trigs, HDL, and LDL all improved on the 10mg  qd dosing.  6) Chronic musculoskeletal pain: his narcotic pain med requirement fluctuates.  He showed me a nearly-full bottle of his hydroc/apap  pills today and I gave no new rx for this med today.  7) AAA: cardiology is arranging approp f/u aortic u/s for 06/2013.  8) CRI, stage 3A--stable.  An After Visit Summary was printed and given to the patient.  FOLLOW UP: 4 mo--need to offer/recommend prevnar 13 for pt at this visit

## 2013-06-09 ENCOUNTER — Telehealth: Payer: Self-pay | Admitting: Family Medicine

## 2013-06-09 NOTE — Telephone Encounter (Signed)
Relevant patient education assigned to patient using Emmi. ° °

## 2013-08-02 ENCOUNTER — Ambulatory Visit: Payer: Medicare Other | Admitting: Family Medicine

## 2013-08-04 ENCOUNTER — Ambulatory Visit (HOSPITAL_COMMUNITY): Payer: Medicare Other | Attending: Cardiology | Admitting: Cardiology

## 2013-08-04 DIAGNOSIS — I700 Atherosclerosis of aorta: Secondary | ICD-10-CM

## 2013-08-04 DIAGNOSIS — I714 Abdominal aortic aneurysm, without rupture, unspecified: Secondary | ICD-10-CM

## 2013-08-04 NOTE — Progress Notes (Signed)
Aortic duplex completed 

## 2013-08-05 ENCOUNTER — Encounter: Payer: Self-pay | Admitting: Family Medicine

## 2013-08-07 ENCOUNTER — Ambulatory Visit (INDEPENDENT_AMBULATORY_CARE_PROVIDER_SITE_OTHER): Payer: Medicare Other | Admitting: *Deleted

## 2013-08-07 ENCOUNTER — Telehealth: Payer: Self-pay | Admitting: Cardiology

## 2013-08-07 DIAGNOSIS — I429 Cardiomyopathy, unspecified: Secondary | ICD-10-CM

## 2013-08-07 DIAGNOSIS — I5022 Chronic systolic (congestive) heart failure: Secondary | ICD-10-CM

## 2013-08-07 DIAGNOSIS — I428 Other cardiomyopathies: Secondary | ICD-10-CM

## 2013-08-07 LAB — MDC_IDC_ENUM_SESS_TYPE_REMOTE
Brady Statistic AP VS Percent: 0.03 %
Brady Statistic AS VS Percent: 0.04 %
HIGH POWER IMPEDANCE MEASURED VALUE: 209 Ohm
HIGH POWER IMPEDANCE MEASURED VALUE: 380 Ohm
HIGH POWER IMPEDANCE MEASURED VALUE: 59 Ohm
HighPow Impedance: 46 Ohm
Lead Channel Impedance Value: 4047 Ohm
Lead Channel Impedance Value: 4047 Ohm
Lead Channel Impedance Value: 456 Ohm
Lead Channel Impedance Value: 456 Ohm
Lead Channel Pacing Threshold Amplitude: 0.75 V
Lead Channel Pacing Threshold Amplitude: 0.875 V
Lead Channel Pacing Threshold Pulse Width: 0.4 ms
Lead Channel Pacing Threshold Pulse Width: 0.5 ms
Lead Channel Sensing Intrinsic Amplitude: 19.125 mV
Lead Channel Sensing Intrinsic Amplitude: 19.125 mV
Lead Channel Sensing Intrinsic Amplitude: 2.125 mV
Lead Channel Sensing Intrinsic Amplitude: 2.125 mV
Lead Channel Setting Pacing Amplitude: 2.5 V
Lead Channel Setting Pacing Pulse Width: 0.4 ms
MDC IDC MSMT BATTERY VOLTAGE: 3.07 V
MDC IDC MSMT LEADCHNL RA IMPEDANCE VALUE: 494 Ohm
MDC IDC MSMT LEADCHNL RV PACING THRESHOLD AMPLITUDE: 0.875 V
MDC IDC MSMT LEADCHNL RV PACING THRESHOLD PULSEWIDTH: 0.4 ms
MDC IDC SESS DTM: 20150511192452
MDC IDC SET LEADCHNL LV PACING AMPLITUDE: 2 V
MDC IDC SET LEADCHNL LV PACING PULSEWIDTH: 0.5 ms
MDC IDC SET LEADCHNL RA PACING AMPLITUDE: 1.5 V
MDC IDC SET LEADCHNL RV SENSING SENSITIVITY: 0.3 mV
MDC IDC SET ZONE DETECTION INTERVAL: 250 ms
MDC IDC STAT BRADY AP VP PERCENT: 1.17 %
MDC IDC STAT BRADY AS VP PERCENT: 98.76 %
MDC IDC STAT BRADY RA PERCENT PACED: 1.2 %
MDC IDC STAT BRADY RV PERCENT PACED: 99.93 %
Zone Setting Detection Interval: 300 ms
Zone Setting Detection Interval: 350 ms
Zone Setting Detection Interval: 450 ms

## 2013-08-07 NOTE — Progress Notes (Signed)
Remote ICD transmission.   

## 2013-08-07 NOTE — Telephone Encounter (Signed)
LMOVM reminding pt to send remote transmission.   

## 2013-08-16 ENCOUNTER — Telehealth: Payer: Self-pay | Admitting: Family Medicine

## 2013-08-16 NOTE — Telephone Encounter (Signed)
Refill request for trazadone.  Patient las OV was 06/08/13.  Next OV scheduled is 10/09/13.  Last Rx given 06/09/12 x 5 refills.  Please advise.

## 2013-08-17 MED ORDER — TRAZODONE HCL 50 MG PO TABS: ORAL_TABLET | ORAL | Status: DC

## 2013-08-17 NOTE — Telephone Encounter (Signed)
eRx for trazodone sent today.

## 2013-08-25 ENCOUNTER — Encounter: Payer: Self-pay | Admitting: Cardiology

## 2013-08-30 ENCOUNTER — Telehealth: Payer: Self-pay | Admitting: Family Medicine

## 2013-08-30 MED ORDER — HYDROCODONE-ACETAMINOPHEN 10-325 MG PO TABS
ORAL_TABLET | ORAL | Status: DC
Start: 2013-08-30 — End: 2013-12-28

## 2013-08-30 NOTE — Telephone Encounter (Signed)
Pt requesting vicodin rf.  Last Rf was for fill on or after 04/09/13.  Last OV was 06/08/13.  Please advise rf.

## 2013-08-30 NOTE — Telephone Encounter (Signed)
Pt aware.  His wife will come to pick up Rx at front desk.

## 2013-08-30 NOTE — Telephone Encounter (Signed)
Vcodin rx printed.

## 2013-09-05 ENCOUNTER — Encounter: Payer: Self-pay | Admitting: Internal Medicine

## 2013-09-08 ENCOUNTER — Telehealth: Payer: Self-pay | Admitting: Family Medicine

## 2013-09-08 MED ORDER — ALBUTEROL SULFATE HFA 108 (90 BASE) MCG/ACT IN AERS: 2.0000 | INHALATION_SPRAY | Freq: Four times a day (QID) | RESPIRATORY_TRACT | Status: DC | PRN

## 2013-09-08 NOTE — Telephone Encounter (Signed)
Ventolin sent into pharmacy.

## 2013-09-18 ENCOUNTER — Telehealth: Payer: Self-pay | Admitting: Family Medicine

## 2013-09-18 NOTE — Telephone Encounter (Signed)
Pt called requesting RF for his valium.  I called [facility] pharmacy because he was uncertain about his refills.  Pharmacy tech said all valium rfs were canceled but it didn't say which provider canceled them.  Please advise.

## 2013-09-19 ENCOUNTER — Other Ambulatory Visit: Payer: Self-pay | Admitting: Family Medicine

## 2013-09-19 MED ORDER — DIAZEPAM 10 MG PO TABS: ORAL_TABLET | ORAL | Status: DC

## 2013-09-19 NOTE — Telephone Encounter (Signed)
Rx printed

## 2013-10-09 ENCOUNTER — Ambulatory Visit: Payer: Medicare Other | Admitting: Family Medicine

## 2013-10-11 ENCOUNTER — Ambulatory Visit (INDEPENDENT_AMBULATORY_CARE_PROVIDER_SITE_OTHER): Payer: Medicare Other | Admitting: Family Medicine

## 2013-10-11 ENCOUNTER — Encounter: Payer: Self-pay | Admitting: Family Medicine

## 2013-10-11 VITALS — BP 112/70 | HR 60 | Temp 98.0°F | Resp 18 | Ht 67.0 in | Wt 152.0 lb

## 2013-10-11 DIAGNOSIS — K219 Gastro-esophageal reflux disease without esophagitis: Secondary | ICD-10-CM

## 2013-10-11 DIAGNOSIS — N183 Chronic kidney disease, stage 3 unspecified: Secondary | ICD-10-CM

## 2013-10-11 DIAGNOSIS — Z23 Encounter for immunization: Secondary | ICD-10-CM

## 2013-10-11 DIAGNOSIS — G4700 Insomnia, unspecified: Secondary | ICD-10-CM

## 2013-10-11 DIAGNOSIS — Z0000 Encounter for general adult medical examination without abnormal findings: Secondary | ICD-10-CM

## 2013-10-11 LAB — BASIC METABOLIC PANEL
BUN: 9 mg/dL (ref 6–23)
CHLORIDE: 104 meq/L (ref 96–112)
CO2: 25 meq/L (ref 19–32)
CREATININE: 1.3 mg/dL (ref 0.4–1.5)
Calcium: 9.4 mg/dL (ref 8.4–10.5)
GFR: 58.33 mL/min — ABNORMAL LOW (ref >60.00)
GLUCOSE: 124 mg/dL — AB (ref 70–99)
Potassium: 3.8 mEq/L (ref 3.5–5.1)
Sodium: 138 mEq/L (ref 135–145)

## 2013-10-11 MED ORDER — OMEPRAZOLE 20 MG PO CPDR: 20.0000 mg | DELAYED_RELEASE_CAPSULE | Freq: Two times a day (BID) | ORAL | Status: DC

## 2013-10-11 MED ORDER — SUVOREXANT 10 MG PO TABS: 10.0000 mg | ORAL_TABLET | Freq: Every day | ORAL | Status: DC

## 2013-10-11 NOTE — Progress Notes (Signed)
Pre visit review using our clinic review tool, if applicable. No additional management support is needed unless otherwise documented below in the visit note. 

## 2013-10-11 NOTE — Assessment & Plan Note (Addendum)
Start trial of Belsomra 10mg  qhs. He is now off trazodone and ambien for the last 2 wks.  He has failed trials of many other sedative hypnotics for insomnia in the past--this med today is a last resort.

## 2013-10-11 NOTE — Progress Notes (Addendum)
OFFICE NOTE  10/11/2013  CC:  Chief Complaint  Patient presents with  . Follow-up  . Insomnia     HPI: Patient is a 68 y.o. Caucasian male who is here for 4 mo f/u insomnia, polypharmacy.   Still has lots of problems initiating and maintaining sleep.  Meds help very inconsistently. Takes valium 10mg  at bedtime, plus trazodone 50mg  qhs, and ambien 10mg  qhs.  Has not taken the ambien and trazodone in a couple of weeks now, though. Lays there and worries.  Occ back and shoulder pain contribute to trouble sleeping but new mattress helps with this. Denies RLS.  Omeprazole at the 40mg  per day dose caused diarrhea.  He stopped it and the diarrhea resolved. The 20mg  omep bid dosing did not cause this problem.  Pertinent PMH:  Past surgical, social, and family history reviewed and no changes noted since last office visit.  MEDS:  Outpatient Prescriptions Prior to Visit  Medication Sig Dispense Refill  . aspirin EC 81 MG tablet Take 81 mg by mouth daily.      . carvedilol (COREG) 12.5 MG tablet Take 1 tablet (12.5 mg total) by mouth 2 (two) times daily.  180 tablet  3  . Coenzyme Q10 50 MG CAPS Take 50 mg by mouth daily.      . diazepam (VALIUM) 10 MG tablet TAKE ONE TABLET BY MOUTH THREE TIMES DAILY AS NEEDED FOR ANXIETY  90 tablet  5  . digoxin (LANOXIN) 0.125 MG tablet Take 0.5 tablets (0.0625 mg total) by mouth daily.  90 tablet  1  . HYDROcodone-acetaminophen (NORCO) 10-325 MG per tablet 1 tab po qid prn  120 tablet  0  . ramipril (ALTACE) 5 MG capsule Take 1 capsule (5 mg total) by mouth daily.  90 capsule  3  . rosuvastatin (CRESTOR) 10 MG tablet Take 1 tablet (10 mg total) by mouth daily.      [name]. spironolactone (ALDACTONE) 25 MG tablet Take 1 tablet (25 mg total) by mouth daily.  90 tablet  1  . traZODone (DESYREL) 50 MG tablet 1 tab po qhs for insomnia  30 tablet  11  . venlafaxine XR (EFFEXOR-XR) 75 MG 24 hr capsule Take 1 capsule (75 mg total) by mouth 3 (three) times daily.  90  capsule  6  . omeprazole (PRILOSEC) 40 MG capsule Take 1 capsule (40 mg total) by mouth daily.  30 capsule  12  . albuterol (PROVENTIL HFA;VENTOLIN HFA) 108 (90 BASE) MCG/ACT inhaler Inhale 2 puffs into the lungs every 6 (six) hours as needed for wheezing.  1 Inhaler  3  . albuterol (PROVENTIL) (2.5 MG/3ML) 0.083% nebulizer solution 1 unit dose via nebulizer q4h prn wheezing/SOB  75 mL  1  . zolpidem (AMBIEN) 5 MG tablet Take 1 tablet (5 mg total) by mouth at bedtime as needed for sleep.  30 tablet  5   No facility-administered medications prior to visit.    PE: Blood pressure 112/70, pulse 60, temperature 98 F (36.7 C), temperature source Temporal, resp. rate 18, height 5\' 7"  (1.702 m), weight 152 lb (68.947 kg), SpO2 100.00%. Gen: Alert, well appearing.  Patient is oriented to person, place, time, and situation. AFFECT: pleasant, lucid thought and speech. No further exam today.   IMPRESSION AND PLAN:  Insomnia Start trial of Belsomra 10mg  qhs. He is now off trazodone and ambien for the last 2 wks.  He has failed trials of many other sedative hypnotics for insomnia in the past--this med today  is a last resort.  Chronic kidney disease (CKD), stage III (moderate) Check BMET today.  GERD (gastroesophageal reflux disease) Diarrhea from 40mg  omeprazole but does fine with 20mg  omeprazole bid, so new rx for this was done today.   Prevnar 13 IM today.  An After Visit Summary was printed and given to the patient.  FOLLOW UP: 1 mo

## 2013-10-11 NOTE — Addendum Note (Signed)
Addended by: Jeoffrey Massed on: 10/11/2013 02:12 PM   Modules accepted: Orders, Medications

## 2013-10-11 NOTE — Assessment & Plan Note (Signed)
Check BMET today 

## 2013-10-11 NOTE — Assessment & Plan Note (Signed)
Diarrhea from 40mg  omeprazole but does fine with 20mg  omeprazole bid, so new rx for this was done today.

## 2013-10-17 ENCOUNTER — Telehealth: Payer: Self-pay | Admitting: Family Medicine

## 2013-10-17 NOTE — Telephone Encounter (Signed)
Please advise 

## 2013-10-17 NOTE — Telephone Encounter (Signed)
Patient states the new sleeping medicine costs $400. The insurance will cover everything except $80. Patient said he cannot afford the $80, is there something else he can try? Patient could not recall the name of the medication but will CB in the morning after his wife gets home & tells him the name of the medicine.

## 2013-10-18 NOTE — Telephone Encounter (Signed)
I know the med.  It was the LAST OPTION for trying to help him sleep. I have no further sleep meds to offer him a trial of.-thx

## 2013-10-19 NOTE — Telephone Encounter (Signed)
Patient's wife was notified. Pt's wife stated that he is trying an OTC sleep med and seems to be doing fine on this.

## 2013-10-19 NOTE — Telephone Encounter (Signed)
Noted  

## 2013-10-23 ENCOUNTER — Other Ambulatory Visit: Payer: Self-pay | Admitting: Family Medicine

## 2013-10-23 MED ORDER — OMEPRAZOLE 20 MG PO CPDR: 20.0000 mg | DELAYED_RELEASE_CAPSULE | Freq: Two times a day (BID) | ORAL | Status: DC

## 2013-10-27 ENCOUNTER — Encounter (HOSPITAL_COMMUNITY): Payer: Self-pay | Admitting: Vascular Surgery

## 2013-11-08 ENCOUNTER — Encounter: Payer: Self-pay | Admitting: Internal Medicine

## 2013-11-08 ENCOUNTER — Ambulatory Visit (INDEPENDENT_AMBULATORY_CARE_PROVIDER_SITE_OTHER): Payer: Medicare Other | Admitting: *Deleted

## 2013-11-08 ENCOUNTER — Telehealth: Payer: Self-pay | Admitting: Cardiology

## 2013-11-08 DIAGNOSIS — I428 Other cardiomyopathies: Secondary | ICD-10-CM

## 2013-11-08 DIAGNOSIS — I429 Cardiomyopathy, unspecified: Secondary | ICD-10-CM

## 2013-11-08 NOTE — Telephone Encounter (Signed)
LMOVM reminding pt to send remote transmission.   

## 2013-11-08 NOTE — Progress Notes (Signed)
Remote ICD transmission.   

## 2013-11-13 ENCOUNTER — Telehealth: Payer: Self-pay | Admitting: Family Medicine

## 2013-11-13 MED ORDER — CLINDAMYCIN HCL 300 MG PO CAPS: 300.0000 mg | ORAL_CAPSULE | Freq: Three times a day (TID) | ORAL | Status: DC

## 2013-11-13 NOTE — Telephone Encounter (Signed)
Patient Information:  Caller Name: Shigeto  Phone: [phone]  Patient: Marvin Bell, Marvin Bell  Gender: Male  DOB: 1945/08/14  Age: 68 Years  PCP: [facility])  Office Follow Up:  Does the office need to follow up with this patient?: Yes  Instructions For The Office: Disposition is for patient to call Dentist for evaluation.  Patient does not have a dentist that he can see at this time and does not feel that a dentist will evaluate without insurance.  Please see note for details and follow up with patient if patient can be seen in office for evaluation or if other recommendations needed for treatment.  RN Note:  Some swelling to gums noted, no swelling to outside of face or tenderness, no swelling to lymph nodes.  Pain 4/10 prior to taking Norco and complete relief with taking the Norco.  Patient states that Dentist he was seeing has retired.  Has seen new dentist since and cost was $800 and patient does not feel that he wants to go back to same dentist.  Patient does have teeth remaining however states that there are no teeth in the area of pain.  There is redness to the gum area at surrounding teeth.  Patient states that without money or insurance a Dentist will not see him and would like to know if provider will assess for infection and treat for acute symptoms.  Symptoms  Reason For Call & Symptoms: Pain in upper gums, sore throat  Reviewed Health History In EMR: Yes  Reviewed Medications In EMR: Yes  Reviewed Allergies In EMR: Yes  Reviewed Surgeries / Procedures: Yes  Date of Onset of Symptoms: 11/11/2013  Treatments Tried: Norco 10-325 taken at 10:15  Treatments Tried Worked: Yes  Guideline(s) Used:  Toothache  Disposition Per Guideline:   Call Dentist Today  Reason For Disposition Reached:   Red or yellow lump present at the gum line of the painful tooth  Advice Given:  Apply Cold to the Area:   Apply an ice pack to the painful jaw for 20  minutes.  Patient Will Follow Care Advice:  YES

## 2013-11-13 NOTE — Telephone Encounter (Signed)
Pt aware of antibiotic at [facility].  Pt states he has norco left still.

## 2013-11-13 NOTE — Telephone Encounter (Deleted)
Please advise 

## 2013-11-13 NOTE — Telephone Encounter (Signed)
Okay to schedule patient?  Please advise.

## 2013-11-13 NOTE — Telephone Encounter (Signed)
Pls eRx clindamycin 300mg , 1 tab tid x 10d, #30, no RF. Continue norco for pain.  Is he about to run out of this med-thx

## 2013-11-24 LAB — MDC_IDC_ENUM_SESS_TYPE_REMOTE
Battery Voltage: 3.05 V
Brady Statistic AP VS Percent: 0.03 %
Brady Statistic RA Percent Paced: 0.31 %
Date Time Interrogation Session: 20150812175234
HIGH POWER IMPEDANCE MEASURED VALUE: 171 Ohm
HIGH POWER IMPEDANCE MEASURED VALUE: 44 Ohm
HighPow Impedance: 380 Ohm
HighPow Impedance: 56 Ohm
Lead Channel Impedance Value: 4047 Ohm
Lead Channel Impedance Value: 437 Ohm
Lead Channel Impedance Value: 437 Ohm
Lead Channel Pacing Threshold Amplitude: 0.625 V
Lead Channel Pacing Threshold Amplitude: 0.75 V
Lead Channel Pacing Threshold Pulse Width: 0.4 ms
Lead Channel Pacing Threshold Pulse Width: 0.4 ms
Lead Channel Pacing Threshold Pulse Width: 0.5 ms
Lead Channel Sensing Intrinsic Amplitude: 23.75 mV
Lead Channel Sensing Intrinsic Amplitude: 23.75 mV
Lead Channel Sensing Intrinsic Amplitude: 3.125 mV
Lead Channel Sensing Intrinsic Amplitude: 3.125 mV
Lead Channel Setting Pacing Amplitude: 1.75 V
Lead Channel Setting Pacing Pulse Width: 0.4 ms
Lead Channel Setting Pacing Pulse Width: 0.5 ms
MDC IDC MSMT LEADCHNL LV IMPEDANCE VALUE: 4047 Ohm
MDC IDC MSMT LEADCHNL RA IMPEDANCE VALUE: 494 Ohm
MDC IDC MSMT LEADCHNL RA PACING THRESHOLD AMPLITUDE: 0.75 V
MDC IDC SET LEADCHNL RA PACING AMPLITUDE: 1.5 V
MDC IDC SET LEADCHNL RV PACING AMPLITUDE: 2.5 V
MDC IDC SET LEADCHNL RV SENSING SENSITIVITY: 0.3 mV
MDC IDC SET ZONE DETECTION INTERVAL: 300 ms
MDC IDC SET ZONE DETECTION INTERVAL: 450 ms
MDC IDC STAT BRADY AP VP PERCENT: 0.28 %
MDC IDC STAT BRADY AS VP PERCENT: 99.66 %
MDC IDC STAT BRADY AS VS PERCENT: 0.04 %
MDC IDC STAT BRADY RV PERCENT PACED: 99.94 %
Zone Setting Detection Interval: 250 ms
Zone Setting Detection Interval: 350 ms

## 2013-12-05 ENCOUNTER — Encounter (HOSPITAL_COMMUNITY): Payer: Self-pay

## 2013-12-05 ENCOUNTER — Ambulatory Visit (HOSPITAL_COMMUNITY)
Admission: RE | Admit: 2013-12-05 | Discharge: 2013-12-05 | Disposition: A | Discharge status: Home / Self Care | Payer: Medicare Other | Source: Ambulatory Visit | Attending: Cardiology | Admitting: Cardiology

## 2013-12-05 VITALS — BP 100/50 | HR 83 | Wt 157.8 lb

## 2013-12-05 DIAGNOSIS — R269 Unspecified abnormalities of gait and mobility: Secondary | ICD-10-CM | POA: Insufficient documentation

## 2013-12-05 DIAGNOSIS — R0989 Other specified symptoms and signs involving the circulatory and respiratory systems: Secondary | ICD-10-CM | POA: Diagnosis not present

## 2013-12-05 DIAGNOSIS — I2510 Atherosclerotic heart disease of native coronary artery without angina pectoris: Secondary | ICD-10-CM | POA: Diagnosis not present

## 2013-12-05 DIAGNOSIS — Z7982 Long term (current) use of aspirin: Secondary | ICD-10-CM | POA: Insufficient documentation

## 2013-12-05 DIAGNOSIS — J4489 Other specified chronic obstructive pulmonary disease: Secondary | ICD-10-CM | POA: Insufficient documentation

## 2013-12-05 DIAGNOSIS — I359 Nonrheumatic aortic valve disorder, unspecified: Secondary | ICD-10-CM | POA: Diagnosis not present

## 2013-12-05 DIAGNOSIS — K219 Gastro-esophageal reflux disease without esophagitis: Secondary | ICD-10-CM | POA: Insufficient documentation

## 2013-12-05 DIAGNOSIS — Z954 Presence of other heart-valve replacement: Secondary | ICD-10-CM | POA: Diagnosis not present

## 2013-12-05 DIAGNOSIS — F17200 Nicotine dependence, unspecified, uncomplicated: Secondary | ICD-10-CM | POA: Diagnosis not present

## 2013-12-05 DIAGNOSIS — E785 Hyperlipidemia, unspecified: Secondary | ICD-10-CM

## 2013-12-05 DIAGNOSIS — J449 Chronic obstructive pulmonary disease, unspecified: Secondary | ICD-10-CM | POA: Insufficient documentation

## 2013-12-05 DIAGNOSIS — I5022 Chronic systolic (congestive) heart failure: Secondary | ICD-10-CM

## 2013-12-05 DIAGNOSIS — I6529 Occlusion and stenosis of unspecified carotid artery: Secondary | ICD-10-CM | POA: Diagnosis not present

## 2013-12-05 DIAGNOSIS — I714 Abdominal aortic aneurysm, without rupture, unspecified: Secondary | ICD-10-CM | POA: Insufficient documentation

## 2013-12-05 DIAGNOSIS — I428 Other cardiomyopathies: Secondary | ICD-10-CM | POA: Diagnosis not present

## 2013-12-05 DIAGNOSIS — I350 Nonrheumatic aortic (valve) stenosis: Secondary | ICD-10-CM

## 2013-12-05 DIAGNOSIS — R0602 Shortness of breath: Secondary | ICD-10-CM

## 2013-12-05 LAB — BASIC METABOLIC PANEL
ANION GAP: 13 (ref 5–15)
BUN: 14 mg/dL (ref 6–23)
CALCIUM: 9 mg/dL (ref 8.4–10.5)
CO2: 24 mEq/L (ref 19–32)
CREATININE: 1.53 mg/dL — AB (ref 0.50–1.35)
Chloride: 102 mEq/L (ref 96–112)
GFR calc Af Amer: 52 mL/min — ABNORMAL LOW (ref >90)
GFR calc non Af Amer: 45 mL/min — ABNORMAL LOW (ref >90)
Glucose, Bld: 63 mg/dL — ABNORMAL LOW (ref 70–99)
Potassium: 4.1 mEq/L (ref 3.7–5.3)
Sodium: 139 mEq/L (ref 137–147)

## 2013-12-05 LAB — DIGOXIN LEVEL: DIGOXIN LVL: 0.3 ng/mL — AB (ref 0.8–2.0)

## 2013-12-05 NOTE — Patient Instructions (Signed)
Labs today  Your physician has requested that you have an echocardiogram. Echocardiography is a painless test that uses sound waves to create images of your heart. It provides your doctor with information about the size and shape of your heart and how well your heart's chambers and valves are working. This procedure takes approximately one hour. There are no restrictions for this procedure.  Your physician has requested that you have a carotid duplex. This test is an ultrasound of the carotid arteries in your neck. It looks at blood flow through these arteries that supply the brain with blood. Allow one hour for this exam. There are no restrictions or special instructions.  Your physician recommends that you schedule a follow-up appointment in: 4 months  Do the following things EVERYDAY: 1) Weigh yourself in the morning before breakfast. Write it down and keep it in a log. 2) Take your medicines as prescribed 3) Eat low salt foods-Limit salt (sodium) to 2000 mg per day.  4) Stay as active as you can everyday 5) Limit all fluids for the day to less than 2 liters 6)
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
## 2013-12-07 ENCOUNTER — Encounter: Payer: Self-pay | Admitting: Cardiology

## 2013-12-12 ENCOUNTER — Other Ambulatory Visit: Payer: Self-pay | Admitting: *Deleted

## 2013-12-12 MED ORDER — SPIRONOLACTONE 25 MG PO TABS: 25.0000 mg | ORAL_TABLET | Freq: Every day | ORAL | Status: DC

## 2013-12-19 ENCOUNTER — Encounter: Payer: Self-pay | Admitting: Internal Medicine

## 2013-12-27 ENCOUNTER — Telehealth: Payer: Self-pay | Admitting: Family Medicine

## 2013-12-27 NOTE — Telephone Encounter (Signed)
Patient Marvin Bell requesting rf of hydrocodone.  Last OV was 10/11/13.  Last Rx was printed 09/26/13 # 120.  Please advise.

## 2013-12-28 HISTORY — PX: TRANSESOPHAGEAL ECHOCARDIOGRAM: SHX273

## 2013-12-28 MED ORDER — HYDROCODONE-ACETAMINOPHEN 10-325 MG PO TABS: ORAL_TABLET | ORAL | Status: DC

## 2013-12-28 NOTE — Telephone Encounter (Signed)
Vicodin rx printed. 

## 2013-12-28 NOTE — Telephone Encounter (Signed)
Patient's wife aware Rx is ready for p/u.

## 2014-01-01 ENCOUNTER — Telehealth (HOSPITAL_COMMUNITY): Payer: Self-pay | Admitting: Vascular Surgery

## 2014-01-01 NOTE — Telephone Encounter (Signed)
PT wife called... She needs a letter stating what pt health issues because pt has been called for [employer] duty.. Please advise

## 2014-01-01 NOTE — Telephone Encounter (Signed)
Spoke w/pt's wife she states pt went to jury duty today and was dismissed due to health conditions, she states the court house recommended they have MD write letter stating due to his heart/health condition pt no longer be called to serve for jury duty, will send to Dr Shirlee Latch and let her know when completed

## 2014-01-01 NOTE — Telephone Encounter (Signed)
That would be fine.  Put in a letter saying he has severe CHF and I will sign.

## 2014-01-03 ENCOUNTER — Encounter (HOSPITAL_COMMUNITY): Payer: Self-pay | Admitting: *Deleted

## 2014-01-03 NOTE — Telephone Encounter (Signed)
Pt's wife aware letter is completed and mailed to them per her request

## 2014-01-08 ENCOUNTER — Ambulatory Visit (HOSPITAL_COMMUNITY): Payer: Medicare Other | Attending: Cardiology | Admitting: Cardiology

## 2014-01-08 ENCOUNTER — Ambulatory Visit (HOSPITAL_COMMUNITY): Payer: Medicare Other | Admitting: Cardiology

## 2014-01-08 DIAGNOSIS — Z720 Tobacco use: Secondary | ICD-10-CM | POA: Diagnosis not present

## 2014-01-08 DIAGNOSIS — I6523 Occlusion and stenosis of bilateral carotid arteries: Secondary | ICD-10-CM

## 2014-01-08 DIAGNOSIS — R0989 Other specified symptoms and signs involving the circulatory and respiratory systems: Secondary | ICD-10-CM

## 2014-01-08 DIAGNOSIS — I350 Nonrheumatic aortic (valve) stenosis: Secondary | ICD-10-CM | POA: Diagnosis not present

## 2014-01-08 DIAGNOSIS — E785 Hyperlipidemia, unspecified: Secondary | ICD-10-CM | POA: Insufficient documentation

## 2014-01-08 DIAGNOSIS — I5022 Chronic systolic (congestive) heart failure: Secondary | ICD-10-CM

## 2014-01-08 NOTE — Progress Notes (Signed)
Echo performed. 

## 2014-01-08 NOTE — Progress Notes (Signed)
Carotid duplex performed 

## 2014-02-12 ENCOUNTER — Telehealth: Payer: Self-pay | Admitting: Cardiology

## 2014-02-12 ENCOUNTER — Ambulatory Visit (INDEPENDENT_AMBULATORY_CARE_PROVIDER_SITE_OTHER): Payer: Medicare Other | Admitting: *Deleted

## 2014-02-12 DIAGNOSIS — I429 Cardiomyopathy, unspecified: Secondary | ICD-10-CM

## 2014-02-12 NOTE — Telephone Encounter (Signed)
Spoke with pt and reminded pt of remote transmission that is due today. Pt verbalized understanding.   

## 2014-02-13 LAB — MDC_IDC_ENUM_SESS_TYPE_REMOTE
Brady Statistic AP VP Percent: 0.34 %
Brady Statistic AP VS Percent: 0.03 %
Brady Statistic AS VP Percent: 99.6 %
Brady Statistic AS VS Percent: 0.04 %
Date Time Interrogation Session: 20151117214724
HIGH POWER IMPEDANCE MEASURED VALUE: 456 Ohm
HighPow Impedance: 48 Ohm
HighPow Impedance: 63 Ohm
Lead Channel Impedance Value: 4047 Ohm
Lead Channel Impedance Value: 4047 Ohm
Lead Channel Impedance Value: 437 Ohm
Lead Channel Impedance Value: 570 Ohm
Lead Channel Pacing Threshold Amplitude: 0.75 V
Lead Channel Pacing Threshold Pulse Width: 0.4 ms
Lead Channel Sensing Intrinsic Amplitude: 16.375 mV
Lead Channel Sensing Intrinsic Amplitude: 3 mV
Lead Channel Sensing Intrinsic Amplitude: 3 mV
Lead Channel Setting Pacing Amplitude: 2.5 V
Lead Channel Setting Pacing Pulse Width: 0.4 ms
Lead Channel Setting Sensing Sensitivity: 0.3 mV
MDC IDC MSMT BATTERY VOLTAGE: 3.01 V
MDC IDC MSMT LEADCHNL LV PACING THRESHOLD AMPLITUDE: 0.75 V
MDC IDC MSMT LEADCHNL LV PACING THRESHOLD PULSEWIDTH: 0.5 ms
MDC IDC MSMT LEADCHNL RV IMPEDANCE VALUE: 551 Ohm
MDC IDC MSMT LEADCHNL RV PACING THRESHOLD AMPLITUDE: 0.625 V
MDC IDC MSMT LEADCHNL RV PACING THRESHOLD PULSEWIDTH: 0.4 ms
MDC IDC MSMT LEADCHNL RV SENSING INTR AMPL: 16.375 mV
MDC IDC SET LEADCHNL LV PACING AMPLITUDE: 1.75 V
MDC IDC SET LEADCHNL LV PACING PULSEWIDTH: 0.5 ms
MDC IDC SET LEADCHNL RA PACING AMPLITUDE: 1.75 V
MDC IDC SET ZONE DETECTION INTERVAL: 300 ms
MDC IDC SET ZONE DETECTION INTERVAL: 350 ms
MDC IDC STAT BRADY RA PERCENT PACED: 0.36 %
MDC IDC STAT BRADY RV PERCENT PACED: 99.94 %
Zone Setting Detection Interval: 250 ms
Zone Setting Detection Interval: 450 ms

## 2014-02-13 NOTE — Progress Notes (Signed)
Remote ICD transmission.   

## 2014-02-15 ENCOUNTER — Encounter: Payer: Self-pay | Admitting: Cardiology

## 2014-02-20 ENCOUNTER — Encounter: Payer: Self-pay | Admitting: Internal Medicine

## 2014-02-26 ENCOUNTER — Telehealth: Payer: Self-pay | Admitting: Family Medicine

## 2014-02-26 MED ORDER — VENLAFAXINE HCL ER 75 MG PO CP24: 75.0000 mg | ORAL_CAPSULE | Freq: Three times a day (TID) | ORAL | Status: DC

## 2014-02-26 NOTE — Telephone Encounter (Signed)
venlafaxine XR (EFFEXOR-XR) 75 MG 24 hr capsule [[id number]]   Needs a refill on this medication.

## 2014-02-26 NOTE — Telephone Encounter (Signed)
[facility]  

## 2014-03-01 ENCOUNTER — Ambulatory Visit: Payer: Medicare Other | Admitting: Family Medicine

## 2014-03-05 ENCOUNTER — Encounter: Payer: Self-pay | Admitting: Family Medicine

## 2014-03-05 ENCOUNTER — Ambulatory Visit (INDEPENDENT_AMBULATORY_CARE_PROVIDER_SITE_OTHER): Payer: Medicare Other | Admitting: Family Medicine

## 2014-03-05 VITALS — BP 84/51 | HR 60 | Temp 98.0°F | Resp 20 | Ht 67.0 in | Wt 156.0 lb

## 2014-03-05 DIAGNOSIS — I952 Hypotension due to drugs: Secondary | ICD-10-CM

## 2014-03-05 DIAGNOSIS — F411 Generalized anxiety disorder: Secondary | ICD-10-CM

## 2014-03-05 DIAGNOSIS — E860 Dehydration: Secondary | ICD-10-CM

## 2014-03-05 DIAGNOSIS — J069 Acute upper respiratory infection, unspecified: Secondary | ICD-10-CM

## 2014-03-05 DIAGNOSIS — Z418 Encounter for other procedures for purposes other than remedying health state: Secondary | ICD-10-CM

## 2014-03-05 DIAGNOSIS — R42 Dizziness and giddiness: Secondary | ICD-10-CM

## 2014-03-05 DIAGNOSIS — Z298 Encounter for other specified prophylactic measures: Secondary | ICD-10-CM

## 2014-03-05 DIAGNOSIS — G894 Chronic pain syndrome: Secondary | ICD-10-CM

## 2014-03-05 MED ORDER — DIAZEPAM 10 MG PO TABS: ORAL_TABLET | ORAL | Status: DC

## 2014-03-05 MED ORDER — CLINDAMYCIN HCL 300 MG PO CAPS: ORAL_CAPSULE | ORAL | Status: DC

## 2014-03-05 MED ORDER — HYDROCODONE-ACETAMINOPHEN 10-325 MG PO TABS: ORAL_TABLET | ORAL | Status: DC

## 2014-03-05 NOTE — Patient Instructions (Signed)
Check your blood pressure and heart rate at home twice per day. Call our office on Wednesday 03/07/14 and leave message with my nurse : tell what your blood pressures and heart rates have been and tell whether or not you still feel dizzy.  Increase your clear fluid intake.  Cut your carvedilol (Coreg) 12.5mg  tab in half and take this 1/2 tab twice per day until further notice.

## 2014-03-05 NOTE — Progress Notes (Signed)
Pre visit review using our clinic review tool, if applicable. No additional management support is needed unless otherwise documented below in the visit note. 

## 2014-03-05 NOTE — Progress Notes (Signed)
OFFICE NOTE  03/05/2014  CC:  Chief Complaint  Patient presents with  . Dizziness   HPI: Patient is a 68 y.o. Caucasian male who is here for resp symptoms.  About 3-4 days ago started getting nasal congestion/runny nose, cough, wife has had similar illness the week prior.  Today has felt a bit dizzy with standing sometimes but not consistently. Also lower back and right hip hurting: this is chronic but worse lately.  On a usual day he takes 1 bid, occ 1 tid.  Helps 75%.  Last couple days has simply been laying in bed taking his meds and not eating or drinking much at all.  No n/v/d.   No chest pain or SOB or palpitations.  No feeling of presyncope.  No focal weakness.  Has dental procedure in a few days and needs rx for SBE prophylaxis--he is penicillin allergic.  Pertinent PMH:  Past medical, surgical, social, and family history reviewed and no changes are noted since last office visit.  MEDS: Not taking belsomra listed below Outpatient Prescriptions Prior to Visit  Medication Sig Dispense Refill  . albuterol (PROVENTIL HFA;VENTOLIN HFA) 108 (90 BASE) MCG/ACT inhaler Inhale 2 puffs into the lungs every 6 (six) hours as needed for wheezing. 1 Inhaler 3  . albuterol (PROVENTIL) (2.5 MG/3ML) 0.083% nebulizer solution 1 unit dose via nebulizer q4h prn wheezing/SOB 75 mL 1  . aspirin EC 81 MG tablet Take 81 mg by mouth daily.    . carvedilol (COREG) 12.5 MG tablet Take 1 tablet (12.5 mg total) by mouth 2 (two) times daily. 180 tablet 3  . Coenzyme Q10 50 MG CAPS Take 50 mg by mouth daily.    . diazepam (VALIUM) 10 MG tablet TAKE ONE TABLET BY MOUTH THREE TIMES DAILY AS NEEDED FOR ANXIETY 90 tablet 5  . digoxin (LANOXIN) 0.125 MG tablet Take 0.5 tablets (0.0625 mg total) by mouth daily. 90 tablet 1  . HYDROcodone-acetaminophen (NORCO) 10-325 MG per tablet 1 tab po qid prn 120 tablet 0  . omeprazole (PRILOSEC) 20 MG capsule Take 1 capsule (20 mg total) by mouth 2 (two) times daily before a  meal. 180 capsule 1  . ramipril (ALTACE) 5 MG capsule Take 1 capsule (5 mg total) by mouth daily. 90 capsule 3  . rosuvastatin (CRESTOR) 10 MG tablet Take 1 tablet (10 mg total) by mouth daily.    [name] spironolactone (ALDACTONE) 25 MG tablet Take 1 tablet (25 mg total) by mouth daily. 90 tablet 0  . venlafaxine XR (EFFEXOR-XR) 75 MG 24 hr capsule Take 1 capsule (75 mg total) by mouth 3 (three) times daily. 90 capsule 3  . clindamycin (CLEOCIN) 300 MG capsule Take 1 capsule (300 mg total) by mouth 3 (three) times daily. 30 capsule 0  . Suvorexant (BELSOMRA) 10 MG TABS Take 10 mg by mouth at bedtime. (Patient not taking: Reported on 03/05/2014) 30 tablet 1   No facility-administered medications prior to visit.    PE: Blood pressure 84/51, pulse 60, temperature 98 F (36.7 C), temperature source Temporal, resp. rate 20, height 5\' 7"  (1.702 m), weight 156 lb (70.761 kg), SpO2 95 %. Repeat BP manual cuff R arm 90/60, pt not dizzy.  Pt was lucid and told me a joke as I was checking his bp. CHE:NIDP: no injection, icteris, swelling, or exudate.  EOMI, PERRLA. Nose: mild crusty rhinorrhea and injected turbinates.  No paranasal sinus tenderness, no facial swelling.  EARS: TMs normal.   Mouth: lips without lesion/swelling.  Oral mucosa pink and moist. Oropharynx without erythema, exudate, or swelling.  CV: RRR LUNGS: CTA bilat, nonlabored resps ABD: soft, NT, nondistended. EXT: no clubbing, cyanosis, or edema.  Skin - no sores or suspicious lesions or rashes or color changes   IMPRESSION AND PLAN:  1) Viral URI, no sign of RAD/bronchitis/COPD exacerbation. Symptomatic care discussed.  2) Hypotension: relative dehydration.  I recommended he take 1/2 his usual dose of Coreg and continue regular dosing of his aldactone, altace, and dig. I asked him to call in 2 days with report of his bp's and HR's.  Push clear fluids.  3) Chronic LB/DJD pain: RF'd vicodin today.  4) Chronic anxiety: RF'd valium  today.  5) .SBE prophylaxis for upcoming dental procedure: clindamycin 600 mg 1 hour prior to procedure (pt has bioprosthetic aortic valve).  An After Visit Summary was printed and given to the patient.  FOLLOW UP: prn (by phone in 2 days)

## 2014-03-07 ENCOUNTER — Telehealth: Payer: Self-pay | Admitting: Family Medicine

## 2014-03-07 NOTE — Telephone Encounter (Signed)
Good.  Continue meds the same as he has been doing the last couple of days since I saw him recently.-thx

## 2014-03-07 NOTE — Telephone Encounter (Signed)
Pt's wife states that pt's bp was  116/78 P 85 in am and 101/65 P 80 in PM 03/06/14 BP was 115/76 P 70 in am and Bp 113/76 P 86 in PM.    Please advise.

## 2014-03-08 ENCOUNTER — Encounter (HOSPITAL_COMMUNITY): Payer: Self-pay | Admitting: Internal Medicine

## 2014-03-08 NOTE — Telephone Encounter (Signed)
Patients wife aware

## 2014-03-10 ENCOUNTER — Encounter: Payer: Self-pay | Admitting: Family Medicine

## 2014-03-15 ENCOUNTER — Telehealth: Payer: Self-pay | Admitting: Cardiology

## 2014-03-15 MED ORDER — SPIRONOLACTONE 25 MG PO TABS: 25.0000 mg | ORAL_TABLET | Freq: Every day | ORAL | Status: DC

## 2014-03-15 NOTE — Telephone Encounter (Signed)
Pt's wife requesting prescription to [facility] for spironolactone. This has been done.

## 2014-03-15 NOTE — Telephone Encounter (Signed)
New Msg     Pt wife calling, pt doesn't have any refills and he needs a new prescription of spironolactone.   Pt only has 3 days left.   Could a partial prescription get called in until he can order from [facility]?  Please call.

## 2014-04-11 ENCOUNTER — Other Ambulatory Visit: Payer: Self-pay | Admitting: Family Medicine

## 2014-04-11 MED ORDER — OMEPRAZOLE 20 MG PO CPDR: 20.0000 mg | DELAYED_RELEASE_CAPSULE | Freq: Two times a day (BID) | ORAL | Status: DC

## 2014-04-11 MED ORDER — VENLAFAXINE HCL ER 75 MG PO CP24: 75.0000 mg | ORAL_CAPSULE | Freq: Three times a day (TID) | ORAL | Status: DC

## 2014-04-12 ENCOUNTER — Telehealth (HOSPITAL_COMMUNITY): Payer: Self-pay | Admitting: Vascular Surgery

## 2014-04-12 ENCOUNTER — Other Ambulatory Visit (HOSPITAL_COMMUNITY): Payer: Self-pay | Admitting: Cardiology

## 2014-04-12 DIAGNOSIS — I5022 Chronic systolic (congestive) heart failure: Secondary | ICD-10-CM

## 2014-04-12 MED ORDER — RAMIPRIL 5 MG PO CAPS: 5.0000 mg | ORAL_CAPSULE | Freq: Every day | ORAL | Status: DC

## 2014-04-12 MED ORDER — CARVEDILOL 12.5 MG PO TABS: 12.5000 mg | ORAL_TABLET | Freq: Two times a day (BID) | ORAL | Status: DC

## 2014-04-12 MED ORDER — SPIRONOLACTONE 25 MG PO TABS: 25.0000 mg | ORAL_TABLET | Freq: Every day | ORAL | Status: DC

## 2014-04-12 MED ORDER — DIGOXIN 125 MCG PO TABS: 0.0625 mg | ORAL_TABLET | Freq: Every day | ORAL | Status: DC

## 2014-04-12 NOTE — Telephone Encounter (Signed)
Pt wife called pt has new [facility] they would like all his heart prescriptions sent to [facility] with 90 supply ..please advise

## 2014-04-13 MED ORDER — RAMIPRIL 5 MG PO CAPS: 5.0000 mg | ORAL_CAPSULE | Freq: Every day | ORAL | Status: DC

## 2014-04-13 MED ORDER — ASPIRIN EC 81 MG PO TBEC: 81.0000 mg | DELAYED_RELEASE_TABLET | Freq: Every day | ORAL | Status: AC

## 2014-04-13 MED ORDER — SPIRONOLACTONE 25 MG PO TABS: 25.0000 mg | ORAL_TABLET | Freq: Every day | ORAL | Status: DC

## 2014-04-13 MED ORDER — CARVEDILOL 12.5 MG PO TABS: 12.5000 mg | ORAL_TABLET | Freq: Two times a day (BID) | ORAL | Status: DC

## 2014-04-13 MED ORDER — DIGOXIN 125 MCG PO TABS: 0.0625 mg | ORAL_TABLET | Freq: Every day | ORAL | Status: DC

## 2014-04-13 MED ORDER — ROSUVASTATIN CALCIUM 10 MG PO TABS: 10.0000 mg | ORAL_TABLET | Freq: Every day | ORAL | Status: DC

## 2014-04-13 NOTE — Telephone Encounter (Signed)
As requested all cardiology meds sent into [facility] pharmacy, with 90 day supply

## 2014-04-25 ENCOUNTER — Other Ambulatory Visit: Payer: Self-pay

## 2014-04-25 DIAGNOSIS — I5022 Chronic systolic (congestive) heart failure: Secondary | ICD-10-CM

## 2014-04-25 MED ORDER — DIGOXIN 125 MCG PO TABS: 0.0625 mg | ORAL_TABLET | Freq: Every day | ORAL | Status: DC

## 2014-04-25 MED ORDER — RAMIPRIL 5 MG PO CAPS: 5.0000 mg | ORAL_CAPSULE | Freq: Every day | ORAL | Status: DC

## 2014-04-25 MED ORDER — CARVEDILOL 12.5 MG PO TABS: 12.5000 mg | ORAL_TABLET | Freq: Two times a day (BID) | ORAL | Status: DC

## 2014-04-26 ENCOUNTER — Other Ambulatory Visit: Payer: Self-pay | Admitting: Family Medicine

## 2014-04-26 MED ORDER — OMEPRAZOLE 20 MG PO CPDR: 20.0000 mg | DELAYED_RELEASE_CAPSULE | Freq: Two times a day (BID) | ORAL | Status: DC

## 2014-04-26 MED ORDER — VENLAFAXINE HCL ER 75 MG PO CP24: 75.0000 mg | ORAL_CAPSULE | Freq: Three times a day (TID) | ORAL | Status: DC

## 2014-05-07 ENCOUNTER — Ambulatory Visit (INDEPENDENT_AMBULATORY_CARE_PROVIDER_SITE_OTHER): Payer: Commercial Managed Care - HMO | Admitting: Internal Medicine

## 2014-05-07 ENCOUNTER — Encounter: Payer: Self-pay | Admitting: Internal Medicine

## 2014-05-07 VITALS — BP 90/52 | HR 69 | Ht 67.0 in | Wt 158.8 lb

## 2014-05-07 DIAGNOSIS — I447 Left bundle-branch block, unspecified: Secondary | ICD-10-CM

## 2014-05-07 DIAGNOSIS — I429 Cardiomyopathy, unspecified: Secondary | ICD-10-CM | POA: Diagnosis not present

## 2014-05-07 DIAGNOSIS — I5022 Chronic systolic (congestive) heart failure: Secondary | ICD-10-CM | POA: Diagnosis not present

## 2014-05-07 DIAGNOSIS — Z4502 Encounter for adjustment and management of automatic implantable cardiac defibrillator: Secondary | ICD-10-CM

## 2014-05-07 DIAGNOSIS — I428 Other cardiomyopathies: Secondary | ICD-10-CM

## 2014-05-07 LAB — MDC_IDC_ENUM_SESS_TYPE_INCLINIC
Brady Statistic AS VS Percent: 0.04 %
HIGH POWER IMPEDANCE MEASURED VALUE: 228 Ohm
HIGH POWER IMPEDANCE MEASURED VALUE: 49 Ohm
HighPow Impedance: 494 Ohm
HighPow Impedance: 66 Ohm
Lead Channel Impedance Value: 4047 Ohm
Lead Channel Impedance Value: 456 Ohm
Lead Channel Impedance Value: 608 Ohm
Lead Channel Pacing Threshold Amplitude: 0.5 V
Lead Channel Pacing Threshold Amplitude: 0.75 V
Lead Channel Pacing Threshold Amplitude: 1 V
Lead Channel Pacing Threshold Pulse Width: 0.4 ms
Lead Channel Pacing Threshold Pulse Width: 0.4 ms
Lead Channel Setting Pacing Amplitude: 1.75 V
Lead Channel Setting Pacing Amplitude: 2 V
Lead Channel Setting Pacing Amplitude: 2.5 V
Lead Channel Setting Pacing Pulse Width: 0.4 ms
Lead Channel Setting Sensing Sensitivity: 0.3 mV
MDC IDC MSMT BATTERY VOLTAGE: 3.01 V
MDC IDC MSMT LEADCHNL LV IMPEDANCE VALUE: 4047 Ohm
MDC IDC MSMT LEADCHNL RA SENSING INTR AMPL: 3.875 mV
MDC IDC MSMT LEADCHNL RV IMPEDANCE VALUE: 570 Ohm
MDC IDC MSMT LEADCHNL RV PACING THRESHOLD PULSEWIDTH: 0.4 ms
MDC IDC MSMT LEADCHNL RV SENSING INTR AMPL: 14.5 mV
MDC IDC SESS DTM: 20160208163617
MDC IDC SET LEADCHNL LV PACING PULSEWIDTH: 0.5 ms
MDC IDC SET ZONE DETECTION INTERVAL: 300 ms
MDC IDC STAT BRADY AP VP PERCENT: 0.5 %
MDC IDC STAT BRADY AP VS PERCENT: 0.03 %
MDC IDC STAT BRADY AS VP PERCENT: 99.43 %
MDC IDC STAT BRADY RA PERCENT PACED: 0.53 %
MDC IDC STAT BRADY RV PERCENT PACED: 99.94 %
Zone Setting Detection Interval: 250 ms
Zone Setting Detection Interval: 350 ms
Zone Setting Detection Interval: 450 ms

## 2014-05-07 MED ORDER — SPIRONOLACTONE 25 MG PO TABS: 12.5000 mg | ORAL_TABLET | Freq: Every day | ORAL | Status: DC
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
## 2014-05-07 NOTE — Patient Instructions (Addendum)
Your physician has recommended you make the following change in your medication:  1) DECREASE Aldactone (spironolactone) to 12.5 mg daily 2) TAKE Altace (Ramipril) at night.  Please call if this does not improve dizziness issues.  Lab today: BMET  Remote monitoring is used to monitor your Pacemaker of ICD from home. This monitoring reduces the number of office visits required to check your device to one time per year. It allows [place] to keep an eye on the functioning of your device to ensure it is working properly. You are scheduled for a device check from home on 08/06/14. You may send your transmission at any time that day. If you have a wireless device, the transmission will be sent automatically. After your physician reviews your transmission, you will receive a postcard with your next transmission date.  Your physician wants you to follow-up in: 1 year with Dr. Graciela Husbands.  You will receive a reminder letter in the mail two months in advance. If you don't receive a letter, please call our office to schedule the follow-up appointment.

## 2014-05-08 LAB — BASIC METABOLIC PANEL
BUN: 16 mg/dL (ref 6–23)
CO2: 26 mEq/L (ref 19–32)
Calcium: 9.1 mg/dL (ref 8.4–10.5)
Chloride: 104 mEq/L (ref 96–112)
Creatinine, Ser: 1.46 mg/dL (ref 0.40–1.50)
GFR: 50.93 mL/min — AB (ref >60.00)
Glucose, Bld: 74 mg/dL (ref 70–99)
POTASSIUM: 4.2 meq/L (ref 3.5–5.1)
Sodium: 136 mEq/L (ref 135–145)

## 2014-05-09 ENCOUNTER — Encounter: Payer: Self-pay | Admitting: Internal Medicine

## 2014-05-15 ENCOUNTER — Ambulatory Visit: Payer: Commercial Managed Care - HMO | Admitting: Family Medicine

## 2014-05-30 ENCOUNTER — Other Ambulatory Visit: Payer: Self-pay | Admitting: Family Medicine

## 2014-05-30 ENCOUNTER — Telehealth (HOSPITAL_COMMUNITY): Payer: Self-pay | Admitting: Vascular Surgery

## 2014-05-30 DIAGNOSIS — I5022 Chronic systolic (congestive) heart failure: Secondary | ICD-10-CM

## 2014-05-30 MED ORDER — ROSUVASTATIN CALCIUM 10 MG PO TABS: 10.0000 mg | ORAL_TABLET | Freq: Every day | ORAL | Status: DC

## 2014-05-30 NOTE — Telephone Encounter (Signed)
Duplicate chart

## 2014-05-30 NOTE — Telephone Encounter (Signed)
[facility] pharmacy sent a letter to pt.. He needs new prescription Spironalactone fax # [phone]

## 2014-06-06 ENCOUNTER — Ambulatory Visit (INDEPENDENT_AMBULATORY_CARE_PROVIDER_SITE_OTHER): Payer: Commercial Managed Care - HMO | Admitting: Family Medicine

## 2014-06-06 ENCOUNTER — Encounter: Payer: Self-pay | Admitting: Family Medicine

## 2014-06-06 VITALS — BP 98/60 | HR 66 | Temp 97.9°F | Ht 67.0 in | Wt 159.0 lb

## 2014-06-06 DIAGNOSIS — Z79899 Other long term (current) drug therapy: Secondary | ICD-10-CM

## 2014-06-06 DIAGNOSIS — E785 Hyperlipidemia, unspecified: Secondary | ICD-10-CM

## 2014-06-06 DIAGNOSIS — K068 Other specified disorders of gingiva and edentulous alveolar ridge: Secondary | ICD-10-CM

## 2014-06-06 MED ORDER — ATORVASTATIN CALCIUM 40 MG PO TABS: 40.0000 mg | ORAL_TABLET | Freq: Every day | ORAL | Status: DC

## 2014-06-06 NOTE — Progress Notes (Signed)
OFFICE NOTE  06/06/2014  CC: sore gums  HPI: Patient is a 69 y.o. Caucasian male who is here for gums feel sore.  No bleeding of gums. Pain in left jaw/gums last night..  Also some in gums around lower teeth in front.  No fevers.  BPs normal and occ slightly elevated at home.   His most recent dentis visit was cancelled due to bad weather. Last night he took his clindamycin that was rx' d for pre-dental procedure SBE prophylaxis and he notes he already feels less sensitivity in his areas where gums hurt.    Of note, due to financial problems, it is unclear what meds patient is actually buying/taking. He admits to not getting any pain pills or valium "this year". He asks for different statin due to cost.  Pertinent PMH:  Past medical, surgical, social, and family history reviewed and no changes are noted since last office visit.  MEDS:  Outpatient Prescriptions Prior to Visit  Medication Sig Dispense Refill  . albuterol (PROVENTIL HFA;VENTOLIN HFA) 108 (90 BASE) MCG/ACT inhaler Inhale 2 puffs into the lungs every 6 (six) hours as needed for wheezing. 1 Inhaler 3  . albuterol (PROVENTIL) (2.5 MG/3ML) 0.083% nebulizer solution 1 unit dose via nebulizer q4h prn wheezing/SOB 75 mL 1  . aspirin EC 81 MG tablet Take 1 tablet (81 mg total) by mouth daily. 90 tablet 3  . carvedilol (COREG) 12.5 MG tablet Take 1 tablet (12.5 mg total) by mouth 2 (two) times daily. (Patient taking differently: Take 6.25 mg by mouth 2 (two) times daily. ) 180 tablet 3  . clindamycin (CLEOCIN) 300 MG capsule 2 tabs po x 1 dose 1 hour before dental procedure 2 capsule 3  . co-enzyme Q-10 30 MG capsule Take 30 mg by mouth daily.    . diazepam (VALIUM) 10 MG tablet TAKE ONE TABLET BY MOUTH THREE TIMES DAILY AS NEEDED FOR ANXIETY 90 tablet 5  . digoxin (LANOXIN) 0.125 MG tablet Take 0.5 tablets (0.0625 mg total) by mouth daily. 90 tablet 1  . HYDROcodone-acetaminophen (NORCO) 10-325 MG per tablet 1 tab po qid prn 120  tablet 0  . omeprazole (PRILOSEC) 20 MG capsule Take 1 capsule (20 mg total) by mouth 2 (two) times daily before a meal. 180 capsule 1  . ramipril (ALTACE) 5 MG capsule Take 1 capsule (5 mg total) by mouth daily. 90 capsule 3  . rosuvastatin (CRESTOR) 10 MG tablet Take 1 tablet (10 mg total) by mouth daily. 30 tablet 3  . spironolactone (ALDACTONE) 25 MG tablet Take 0.5 tablets (12.5 mg total) by mouth daily. (Patient taking differently: Take 25 mg by mouth daily. ) 15 tablet 11  . venlafaxine XR (EFFEXOR-XR) 75 MG 24 hr capsule Take 1 capsule (75 mg total) by mouth 3 (three) times daily. 90 capsule 1   No facility-administered medications prior to visit.    PE: Blood pressure 98/60, pulse 66, temperature 97.9 F (36.6 C), temperature source Oral, height 5\' 7"  (1.702 m), weight 159 lb (72.122 kg), SpO2 95 %. Gen: Alert, well appearing.  Patient is oriented to person, place, time, and situation. MOUTH: he is missing several teeth.  Gums are receding --noted most severely in lower front teeth.  However, there are no areas of gingival erythema, swelling, or tenderness or friability.  No teeth are sensitive to touch.   Jaws: no palpable tenderness, no swelling or erythema.   IMPRESSION AND PLAN:  1) Gingival pain, receding gums. No sign of active gingivitis,  no sign of significant dental cavity or dental abscess, no focal oral lesion of concern. Reassured pt, recommended no further abx. Recommended he reschedule his dental visit that was recently cancelled due to bad weather.  2) Polypharmacy/cost issues with his statin: he requested generic statin due to cost of crestor. I changed him to atorvastatin  qd.  I want him to return for 30 min chronic med probs f/u in 40mo.  An After Visit Summary was printed and given to the patient.

## 2014-06-06 NOTE — Progress Notes (Signed)
Pre visit review using our clinic review tool, if applicable. No additional management support is needed unless otherwise documented below in the visit note. 

## 2014-06-12 MED ORDER — SPIRONOLACTONE 25 MG PO TABS: 12.5000 mg | ORAL_TABLET | Freq: Every day | ORAL | Status: DC

## 2014-06-12 NOTE — Telephone Encounter (Signed)
As requested, rx faxed to pharmacy

## 2014-06-26 ENCOUNTER — Ambulatory Visit (HOSPITAL_COMMUNITY)
Admission: RE | Admit: 2014-06-26 | Discharge: 2014-06-26 | Disposition: A | Discharge status: Home / Self Care | Payer: Commercial Managed Care - HMO | Source: Ambulatory Visit | Attending: Internal Medicine | Admitting: Internal Medicine

## 2014-06-26 ENCOUNTER — Encounter (HOSPITAL_COMMUNITY): Payer: Self-pay

## 2014-06-26 VITALS — BP 98/54 | HR 62 | Wt 157.2 lb

## 2014-06-26 DIAGNOSIS — I350 Nonrheumatic aortic (valve) stenosis: Secondary | ICD-10-CM | POA: Diagnosis not present

## 2014-06-26 DIAGNOSIS — E785 Hyperlipidemia, unspecified: Secondary | ICD-10-CM | POA: Diagnosis not present

## 2014-06-26 DIAGNOSIS — N183 Chronic kidney disease, stage 3 unspecified: Secondary | ICD-10-CM

## 2014-06-26 DIAGNOSIS — Z720 Tobacco use: Secondary | ICD-10-CM | POA: Insufficient documentation

## 2014-06-26 DIAGNOSIS — I5022 Chronic systolic (congestive) heart failure: Secondary | ICD-10-CM

## 2014-06-26 DIAGNOSIS — I2510 Atherosclerotic heart disease of native coronary artery without angina pectoris: Secondary | ICD-10-CM | POA: Diagnosis not present

## 2014-06-26 DIAGNOSIS — I714 Abdominal aortic aneurysm, without rupture, unspecified: Secondary | ICD-10-CM

## 2014-06-26 DIAGNOSIS — R011 Cardiac murmur, unspecified: Secondary | ICD-10-CM | POA: Insufficient documentation

## 2014-06-26 DIAGNOSIS — R0989 Other specified symptoms and signs involving the circulatory and respiratory systems: Secondary | ICD-10-CM

## 2014-06-26 DIAGNOSIS — F17200 Nicotine dependence, unspecified, uncomplicated: Secondary | ICD-10-CM

## 2014-06-26 MED ORDER — CARVEDILOL 12.5 MG PO TABS: 12.5000 mg | ORAL_TABLET | Freq: Two times a day (BID) | ORAL | Status: DC

## 2014-06-26 NOTE — Patient Instructions (Signed)
STOP Digoxin Your physician recommends that you schedule a lab appointment to have a Fasting Lipid Panel  Your physician has requested that you have an abdominal ultrasound. MAY 2016  Your physician has requested that you have a carotid duplex. This test is an ultrasound of the carotid arteries in your neck. It looks at blood flow through these arteries that supply the brain with blood. Allow one hour for this exam. There are no restrictions or special instructions.  MAY 2016  Your physician recommends that you schedule a follow-up appointment in: 6 months  Do the following things EVERYDAY: 1) Weigh yourself in the morning before breakfast. Write it down and keep it in a log. 2) Take your medicines as prescribed 3) Eat low salt foods-Limit salt (sodium) to 2000 mg per day.  4) Stay as active as you can everyday 5) Limit all fluids for the day to less than 2 liters 6)
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
## 2014-06-29 ENCOUNTER — Other Ambulatory Visit: Payer: Self-pay | Admitting: Family Medicine

## 2014-06-29 MED ORDER — HYDROCODONE-ACETAMINOPHEN 10-325 MG PO TABS: ORAL_TABLET | ORAL | Status: DC

## 2014-06-29 NOTE — Telephone Encounter (Signed)
Patient would like to pick up his pain medicine Rx on Monday 07/02/14. He found out he can get it filled at [facility] for a cheaper price. Please note he will be filling it at [facility].

## 2014-06-29 NOTE — Telephone Encounter (Signed)
Refill request for Hydrocodone Last filled by MD on- 03/05/14 #30 x0 Last Appt: 06/06/2014 Next Appt: 09/06/2014 Please advise refill?

## 2014-07-02 ENCOUNTER — Other Ambulatory Visit (INDEPENDENT_AMBULATORY_CARE_PROVIDER_SITE_OTHER): Payer: Commercial Managed Care - HMO

## 2014-07-02 ENCOUNTER — Other Ambulatory Visit: Payer: Commercial Managed Care - HMO

## 2014-07-02 DIAGNOSIS — E785 Hyperlipidemia, unspecified: Secondary | ICD-10-CM

## 2014-07-02 LAB — LIPID PANEL
CHOLESTEROL: 131 mg/dL (ref 0–200)
HDL: 35.4 mg/dL — ABNORMAL LOW (ref >39.00)
LDL Cholesterol: 59 mg/dL (ref 0–99)
NonHDL: 95.6
TRIGLYCERIDES: 181 mg/dL — AB (ref 0.0–149.0)
Total CHOL/HDL Ratio: 4
VLDL: 36.2 mg/dL (ref 0.0–40.0)

## 2014-07-23 ENCOUNTER — Other Ambulatory Visit (HOSPITAL_COMMUNITY): Payer: Self-pay | Admitting: Internal Medicine

## 2014-07-23 DIAGNOSIS — I6523 Occlusion and stenosis of bilateral carotid arteries: Secondary | ICD-10-CM

## 2014-07-23 DIAGNOSIS — R0989 Other specified symptoms and signs involving the circulatory and respiratory systems: Secondary | ICD-10-CM

## 2014-07-30 ENCOUNTER — Encounter: Payer: Self-pay | Admitting: Internal Medicine

## 2014-08-06 ENCOUNTER — Encounter: Payer: Self-pay | Admitting: Internal Medicine

## 2014-08-06 ENCOUNTER — Ambulatory Visit (INDEPENDENT_AMBULATORY_CARE_PROVIDER_SITE_OTHER): Payer: Commercial Managed Care - HMO | Admitting: *Deleted

## 2014-08-06 ENCOUNTER — Telehealth: Payer: Self-pay | Admitting: Cardiology

## 2014-08-06 DIAGNOSIS — I5022 Chronic systolic (congestive) heart failure: Secondary | ICD-10-CM | POA: Diagnosis not present

## 2014-08-06 DIAGNOSIS — I429 Cardiomyopathy, unspecified: Secondary | ICD-10-CM | POA: Diagnosis not present

## 2014-08-06 DIAGNOSIS — I428 Other cardiomyopathies: Secondary | ICD-10-CM

## 2014-08-06 NOTE — Telephone Encounter (Signed)
Confirmed remote transmission w/ pt wife.   

## 2014-08-07 NOTE — Progress Notes (Signed)
Remote ICD transmission.   

## 2014-08-10 LAB — CUP PACEART REMOTE DEVICE CHECK
Battery Voltage: 2.99 V
Brady Statistic AP VP Percent: 0.43 %
Brady Statistic AP VS Percent: 0.03 %
Brady Statistic AS VP Percent: 99.49 %
Brady Statistic RV Percent Paced: 99.93 %
Date Time Interrogation Session: 20160509215152
HIGH POWER IMPEDANCE MEASURED VALUE: 43 Ohm
HighPow Impedance: 380 Ohm
HighPow Impedance: 55 Ohm
Lead Channel Impedance Value: 4047 Ohm
Lead Channel Impedance Value: 456 Ohm
Lead Channel Pacing Threshold Amplitude: 0.75 V
Lead Channel Pacing Threshold Pulse Width: 0.4 ms
Lead Channel Sensing Intrinsic Amplitude: 16 mV
Lead Channel Sensing Intrinsic Amplitude: 2.5 mV
Lead Channel Sensing Intrinsic Amplitude: 2.5 mV
Lead Channel Setting Pacing Amplitude: 1.75 V
Lead Channel Setting Pacing Amplitude: 2.5 V
Lead Channel Setting Pacing Pulse Width: 0.4 ms
Lead Channel Setting Sensing Sensitivity: 0.3 mV
MDC IDC MSMT LEADCHNL LV IMPEDANCE VALUE: 4047 Ohm
MDC IDC MSMT LEADCHNL LV IMPEDANCE VALUE: 437 Ohm
MDC IDC MSMT LEADCHNL LV PACING THRESHOLD PULSEWIDTH: 0.5 ms
MDC IDC MSMT LEADCHNL RA PACING THRESHOLD AMPLITUDE: 0.875 V
MDC IDC MSMT LEADCHNL RV IMPEDANCE VALUE: 456 Ohm
MDC IDC MSMT LEADCHNL RV PACING THRESHOLD AMPLITUDE: 0.75 V
MDC IDC MSMT LEADCHNL RV PACING THRESHOLD PULSEWIDTH: 0.4 ms
MDC IDC MSMT LEADCHNL RV SENSING INTR AMPL: 16 mV
MDC IDC SET LEADCHNL LV PACING PULSEWIDTH: 0.5 ms
MDC IDC SET LEADCHNL RA PACING AMPLITUDE: 1.75 V
MDC IDC SET ZONE DETECTION INTERVAL: 350 ms
MDC IDC SET ZONE DETECTION INTERVAL: 450 ms
MDC IDC STAT BRADY AS VS PERCENT: 0.05 %
MDC IDC STAT BRADY RA PERCENT PACED: 0.46 %
Zone Setting Detection Interval: 250 ms
Zone Setting Detection Interval: 300 ms

## 2014-08-14 ENCOUNTER — Telehealth: Payer: Self-pay

## 2014-08-14 NOTE — Telephone Encounter (Signed)
PTS WIFE CALLED  CONCERNED THAT PT HAD TWO VISITS THAT THE INSURANCE DID NOT PAY FOR 06/26/14  OV AND A LAB VISIT 07/02/14.  SHE WANTED TO BE SURE THAT THE VASCULAR TESTING THAT PT HAS SCH FOR TOMORROW WOULD BE COVERED BY INSURANCE.   PATIENT HAS [employer] THN SO THERE PV STUDIEDS DO NIT REQUIRE PRIOR AUTH .Marland KitchenWILL MAKE RHONDA IN BILLING AWARE . PT WOULD LIKE A RETURN CALL FROM BILLING DEPT WENDELIN MOSKOS  [facility] CONTACT NUMBER [id number]

## 2014-08-15 ENCOUNTER — Other Ambulatory Visit (HOSPITAL_COMMUNITY): Payer: Commercial Managed Care - HMO

## 2014-08-15 ENCOUNTER — Encounter (HOSPITAL_COMMUNITY): Payer: Commercial Managed Care - HMO

## 2014-08-20 ENCOUNTER — Encounter: Payer: Self-pay | Admitting: Cardiology

## 2014-09-06 ENCOUNTER — Encounter: Payer: Self-pay | Admitting: Family Medicine

## 2014-09-06 ENCOUNTER — Ambulatory Visit (INDEPENDENT_AMBULATORY_CARE_PROVIDER_SITE_OTHER): Payer: Commercial Managed Care - HMO | Admitting: Family Medicine

## 2014-09-06 VITALS — BP 104/63 | HR 62 | Temp 97.8°F | Resp 16 | Wt 158.0 lb

## 2014-09-06 DIAGNOSIS — Z125 Encounter for screening for malignant neoplasm of prostate: Secondary | ICD-10-CM | POA: Insufficient documentation

## 2014-09-06 DIAGNOSIS — J310 Chronic rhinitis: Secondary | ICD-10-CM | POA: Diagnosis not present

## 2014-09-06 MED ORDER — HYDROCODONE-ACETAMINOPHEN 10-325 MG PO TABS: ORAL_TABLET | ORAL | Status: DC

## 2014-09-06 MED ORDER — ALBUTEROL SULFATE HFA 108 (90 BASE) MCG/ACT IN AERS: 2.0000 | INHALATION_SPRAY | Freq: Four times a day (QID) | RESPIRATORY_TRACT | Status: AC | PRN

## 2014-09-06 NOTE — Progress Notes (Signed)
Pre visit review using our clinic review tool, if applicable. No additional management support is needed unless otherwise documented below in the visit note. 

## 2014-09-06 NOTE — Progress Notes (Signed)
OFFICE NOTE  09/23/2014  CC:  Chief Complaint  Patient presents with  . Follow-up    3 month f/u. Pt is not fasting.     HPI: Patient is a 69 y.o. Caucasian male who is here for 3 mo f/u chronic cardiac, pulmonary, psychiatric, and renal issues. Complains of about a month of profuse rhinorrhea in response to bending over forwards and when he eats.  No signif cough, no sneezing, no ST.  He was significantly chilled x 1 day at the onset but none since (this was the day he was scheduled to get repeat abd aortic u/s, repeat echo, and repeat carotid dopplers, so he had to cancel these tests and needs to reschedule them).  At his cardiology f/u 05/2014 his digoxin was d/c'd b/c his EF is now normal.  Otherwise, the above tests were ordered/planned.  COPD: very stable lately.  He is still smoking, trying to cut back.    Mood/anxiety: "I'm better than I have been", but "I still have my spells".  NO SI or HI. Suffers anxiety every day, suffers periods of depression avg 2 days per week.  He is not interested in changing meds. Takes diazepam 1-2 times per day. Low back pain and bilat hip pain:  Pain pills: takes anywhere from a couple every couple weeks to 1-2 every day.  Lately it has been 1-2 per day.      Pertinent PMH:  Past medical, surgical, social, and family history reviewed and no changes are noted since last office visit.  MEDS:  Outpatient Prescriptions Prior to Visit  Medication Sig Dispense Refill  . aspirin EC 81 MG tablet Take 1 tablet (81 mg total) by mouth daily. 90 tablet 3  . atorvastatin (LIPITOR) 40 MG tablet Take 1 tablet (40 mg total) by mouth daily. 30 tablet 6  . carvedilol (COREG) 12.5 MG tablet Take 1 tablet (12.5 mg total) by mouth 2 (two) times daily with a meal. 180 tablet 3  . co-enzyme Q-10 30 MG capsule Take 30 mg by mouth daily.    . diazepam (VALIUM) 10 MG tablet TAKE ONE TABLET BY MOUTH THREE TIMES DAILY AS NEEDED FOR ANXIETY 90 tablet 5  . omeprazole  (PRILOSEC) 20 MG capsule Take 1 capsule (20 mg total) by mouth 2 (two) times daily before a meal. 180 capsule 1  . ramipril (ALTACE) 5 MG capsule Take 1 capsule (5 mg total) by mouth daily. 90 capsule 3  . spironolactone (ALDACTONE) 25 MG tablet Take 0.5 tablets (12.5 mg total) by mouth daily. 45 tablet 11  . venlafaxine XR (EFFEXOR-XR) 75 MG 24 hr capsule Take 1 capsule (75 mg total) by mouth 3 (three) times daily. 90 capsule 1  . albuterol (PROVENTIL) (2.5 MG/3ML) 0.083% nebulizer solution 1 unit dose via nebulizer q4h prn wheezing/SOB 75 mL 1  . HYDROcodone-acetaminophen (NORCO) 10-325 MG per tablet 1 tab po qid prn 120 tablet 0  . clindamycin (CLEOCIN) 300 MG capsule 2 tabs po x 1 dose 1 hour before dental procedure (Patient not taking: Reported on 09/06/2014) 2 capsule 3  . albuterol (PROVENTIL HFA;VENTOLIN HFA) 108 (90 BASE) MCG/ACT inhaler Inhale 2 puffs into the lungs every 6 (six) hours as needed for wheezing. (Patient not taking: Reported on 09/06/2014) 1 Inhaler 3   No facility-administered medications prior to visit.    PE: Blood pressure 104/63, pulse 62, temperature 97.8 F (36.6 C), temperature source Oral, resp. rate 16, weight 158 lb (71.668 kg), SpO2 95 %. Gen: Alert,  well appearing.  Patient is oriented to person, place, time, and situation. AFFECT: pleasant, lucid thought and speech. No further exam today.  LABS: Lab Results  Component Value Date   TSH 0.89 12/08/2011   Lab Results  Component Value Date   WBC 15.1* 02/03/2013   HGB 14.9 02/03/2013   HCT 42.9 02/03/2013   MCV 93.3 02/03/2013   PLT 243 02/03/2013   Lab Results  Component Value Date   CREATININE 1.46 05/07/2014   BUN 16 05/07/2014   NA 136 05/07/2014   K 4.2 05/07/2014   CL 104 05/07/2014   CO2 26 05/07/2014   Lab Results  Component Value Date   ALT 15 05/26/2013   AST 15 05/26/2013   ALKPHOS 81 05/26/2013   BILITOT 0.4 05/26/2013   Lab Results  Component Value Date   CHOL 131 07/02/2014    Lab Results  Component Value Date   HDL 35.40* 07/02/2014   Lab Results  Component Value Date   LDLCALC 59 07/02/2014   Lab Results  Component Value Date   TRIG 181.0* 07/02/2014   Lab Results  Component Value Date   CHOLHDL 4 07/02/2014    IMPRESSION AND PLAN:  1) Cardiac: chronic LBBB, pt now s/p successful CRT; s/p AVR (bioprosthetic); ischemic+nonischemic dilated CM: stable DOE, otherwise asymptomatic. Needs to call cardiologist's office to reschedule the echo, carotid dopplers, and AAA surveillance u/s that he recently had to miss. Dig d/c'd by cardiologist, otherwise meds same.  2) COPD: stable mild DOE.  He continues to smoke but says very small amount.  Encouraged pt to completely quit. He is not interested in smoking cessation med assistance at this time. Continue albuterol q6h prn (daliresp did not help per pt PLUS it was a financial burden).  3) Depression/anxiety: waxing/waning sx's as per his usual.  No change in meds at this time.  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 Will offer trial of nasal ipratropium spray to use tid prn.  No labs today.  An After Visit Summary was printed and given to the patient.  FOLLOW UP: 4 mo

## 2014-09-07 ENCOUNTER — Other Ambulatory Visit: Payer: Self-pay | Admitting: *Deleted

## 2014-09-07 DIAGNOSIS — J449 Chronic obstructive pulmonary disease, unspecified: Secondary | ICD-10-CM

## 2014-09-07 MED ORDER — ALBUTEROL SULFATE (2.5 MG/3ML) 0.083% IN NEBU: INHALATION_SOLUTION | RESPIRATORY_TRACT | Status: AC

## 2014-09-07 NOTE — Telephone Encounter (Signed)
Fax from pharmacy stating that pt wanted Rx for albuterol nebulizer not the inhaler. Rx sent.

## 2014-09-12 ENCOUNTER — Telehealth (HOSPITAL_COMMUNITY): Payer: Self-pay | Admitting: Vascular Surgery

## 2014-09-12 NOTE — Telephone Encounter (Signed)
Left pt a message to give me a call to get those test rescheduled

## 2014-09-13 ENCOUNTER — Telehealth (HOSPITAL_COMMUNITY): Payer: Self-pay | Admitting: Vascular Surgery

## 2014-09-13 NOTE — Telephone Encounter (Signed)
Called pt a second time this morning left message to give me a cal to reschedule appt for cv/ab ultrasound

## 2014-09-25 ENCOUNTER — Ambulatory Visit (HOSPITAL_COMMUNITY): Payer: Commercial Managed Care - HMO | Attending: Internal Medicine

## 2014-09-25 ENCOUNTER — Ambulatory Visit (HOSPITAL_BASED_OUTPATIENT_CLINIC_OR_DEPARTMENT_OTHER): Payer: Commercial Managed Care - HMO

## 2014-09-25 DIAGNOSIS — R0989 Other specified symptoms and signs involving the circulatory and respiratory systems: Secondary | ICD-10-CM | POA: Diagnosis not present

## 2014-09-25 DIAGNOSIS — I6523 Occlusion and stenosis of bilateral carotid arteries: Secondary | ICD-10-CM | POA: Diagnosis not present

## 2014-09-25 DIAGNOSIS — I714 Abdominal aortic aneurysm, without rupture, unspecified: Secondary | ICD-10-CM

## 2014-10-18 ENCOUNTER — Other Ambulatory Visit: Payer: Self-pay | Admitting: *Deleted

## 2014-10-18 MED ORDER — VENLAFAXINE HCL ER 75 MG PO CP24: 75.0000 mg | ORAL_CAPSULE | Freq: Three times a day (TID) | ORAL | Status: DC

## 2014-10-18 NOTE — Telephone Encounter (Signed)
Pts wife called stating that pt need RF for his venlafaxine.  LOV: 09/06/14 NOV: 01/04/15 Last written: 04/26/14 #90 w/ 1RF She stated that she will need Rx sent to [facility] for 90 day supply. Rx sent.

## 2014-10-26 ENCOUNTER — Telehealth: Payer: Self-pay | Admitting: *Deleted

## 2014-10-26 MED ORDER — ZOLPIDEM TARTRATE 5 MG PO TABS: 5.0000 mg | ORAL_TABLET | Freq: Every evening | ORAL | Status: DC | PRN

## 2014-10-26 NOTE — Telephone Encounter (Signed)
Rx faxed

## 2014-10-26 NOTE — Telephone Encounter (Signed)
Rx printed as per pt request.

## 2014-10-26 NOTE — Telephone Encounter (Signed)
Pt called requesting a Rx for generic Ambien. He stated that he is having trouble sleeping and has took this in the past. Pharmacy. [facility] [place]. Please advise. Thanks.

## 2014-11-07 ENCOUNTER — Telehealth: Payer: Self-pay | Admitting: Cardiology

## 2014-11-07 ENCOUNTER — Ambulatory Visit (INDEPENDENT_AMBULATORY_CARE_PROVIDER_SITE_OTHER): Payer: Commercial Managed Care - HMO | Admitting: *Deleted

## 2014-11-07 DIAGNOSIS — I429 Cardiomyopathy, unspecified: Secondary | ICD-10-CM | POA: Diagnosis not present

## 2014-11-07 DIAGNOSIS — I5022 Chronic systolic (congestive) heart failure: Secondary | ICD-10-CM | POA: Diagnosis not present

## 2014-11-07 DIAGNOSIS — I428 Other cardiomyopathies: Secondary | ICD-10-CM

## 2014-11-07 NOTE — Telephone Encounter (Signed)
LMOVM reminding pt to send remote transmission.   

## 2014-11-08 ENCOUNTER — Encounter: Payer: Self-pay | Admitting: Cardiology

## 2014-11-08 DIAGNOSIS — I429 Cardiomyopathy, unspecified: Secondary | ICD-10-CM | POA: Diagnosis not present

## 2014-11-08 DIAGNOSIS — I5022 Chronic systolic (congestive) heart failure: Secondary | ICD-10-CM | POA: Diagnosis not present

## 2014-11-08 NOTE — Progress Notes (Signed)
Remote ICD transmission.   

## 2014-11-14 LAB — CUP PACEART REMOTE DEVICE CHECK
Brady Statistic AP VP Percent: 0.19 %
Brady Statistic AP VS Percent: 0.03 %
Brady Statistic RV Percent Paced: 99.93 %
Date Time Interrogation Session: 20160811180906
HIGH POWER IMPEDANCE MEASURED VALUE: 342 Ohm
HighPow Impedance: 42 Ohm
HighPow Impedance: 55 Ohm
Lead Channel Impedance Value: 399 Ohm
Lead Channel Impedance Value: 4047 Ohm
Lead Channel Impedance Value: 437 Ohm
Lead Channel Pacing Threshold Amplitude: 0.75 V
Lead Channel Pacing Threshold Amplitude: 0.875 V
Lead Channel Pacing Threshold Pulse Width: 0.4 ms
Lead Channel Sensing Intrinsic Amplitude: 12.25 mV
Lead Channel Sensing Intrinsic Amplitude: 3.375 mV
Lead Channel Sensing Intrinsic Amplitude: 3.375 mV
Lead Channel Setting Pacing Amplitude: 2 V
Lead Channel Setting Pacing Amplitude: 2.5 V
Lead Channel Setting Pacing Pulse Width: 0.5 ms
MDC IDC MSMT BATTERY VOLTAGE: 2.96 V
MDC IDC MSMT LEADCHNL LV IMPEDANCE VALUE: 4047 Ohm
MDC IDC MSMT LEADCHNL LV PACING THRESHOLD PULSEWIDTH: 0.5 ms
MDC IDC MSMT LEADCHNL RA IMPEDANCE VALUE: 494 Ohm
MDC IDC MSMT LEADCHNL RA PACING THRESHOLD AMPLITUDE: 0.875 V
MDC IDC MSMT LEADCHNL RV PACING THRESHOLD PULSEWIDTH: 0.4 ms
MDC IDC MSMT LEADCHNL RV SENSING INTR AMPL: 12.25 mV
MDC IDC SET LEADCHNL RA PACING AMPLITUDE: 1.75 V
MDC IDC SET LEADCHNL RV PACING PULSEWIDTH: 0.4 ms
MDC IDC SET LEADCHNL RV SENSING SENSITIVITY: 0.3 mV
MDC IDC SET ZONE DETECTION INTERVAL: 250 ms
MDC IDC SET ZONE DETECTION INTERVAL: 350 ms
MDC IDC SET ZONE DETECTION INTERVAL: 450 ms
MDC IDC STAT BRADY AS VP PERCENT: 99.73 %
MDC IDC STAT BRADY AS VS PERCENT: 0.05 %
MDC IDC STAT BRADY RA PERCENT PACED: 0.22 %
Zone Setting Detection Interval: 300 ms

## 2014-11-22 ENCOUNTER — Telehealth: Payer: Self-pay | Admitting: Internal Medicine

## 2014-11-22 NOTE — Telephone Encounter (Signed)
Returned patient's call.  Patient states that he received his new monitor but does not know "if it'll work out for me or not".  Patient states that he will attempt to send a manual transmission.  I will call patient back before the end of the day and let him know if transmission was received.  Patient appreciative of call and voices understanding.

## 2014-11-22 NOTE — Telephone Encounter (Signed)
Pt made aware transmission was received. Pt appreciative of call.

## 2014-11-22 NOTE — Telephone Encounter (Signed)
New message      Pt received a new transmission box.  Should he send a transmission in now using the new box?

## 2014-12-07 ENCOUNTER — Encounter: Payer: Self-pay | Admitting: Cardiology

## 2014-12-17 ENCOUNTER — Telehealth: Payer: Self-pay | Admitting: Family Medicine

## 2014-12-17 ENCOUNTER — Encounter: Payer: Self-pay | Admitting: Internal Medicine

## 2014-12-17 MED ORDER — HYDROCODONE-ACETAMINOPHEN 10-325 MG PO TABS: ORAL_TABLET | ORAL | Status: DC

## 2014-12-17 NOTE — Telephone Encounter (Signed)
Please advise. Thanks.  

## 2014-12-17 NOTE — Telephone Encounter (Signed)
Patient has put his back out working on a [employer]. He would like to know if Dr. Milinda Cave recommends trying a different pain medication or should he continue with what he is already taking. He will need a refill.

## 2014-12-17 NOTE — Telephone Encounter (Signed)
Pt advised and voiced understanding.  He stated that his wife will be by on Friday to p/u Rx.

## 2014-12-17 NOTE — Telephone Encounter (Signed)
Continue same pain med. I printed a rx.

## 2015-01-04 ENCOUNTER — Ambulatory Visit (INDEPENDENT_AMBULATORY_CARE_PROVIDER_SITE_OTHER): Payer: Commercial Managed Care - HMO | Admitting: Family Medicine

## 2015-01-04 ENCOUNTER — Encounter: Payer: Self-pay | Admitting: Family Medicine

## 2015-01-04 VITALS — BP 120/70 | HR 98 | Temp 98.2°F | Resp 16 | Ht 67.0 in | Wt 159.0 lb

## 2015-01-04 DIAGNOSIS — Z23 Encounter for immunization: Secondary | ICD-10-CM | POA: Diagnosis not present

## 2015-01-04 DIAGNOSIS — F418 Other specified anxiety disorders: Secondary | ICD-10-CM

## 2015-01-04 DIAGNOSIS — N183 Chronic kidney disease, stage 3 unspecified: Secondary | ICD-10-CM

## 2015-01-04 DIAGNOSIS — I429 Cardiomyopathy, unspecified: Secondary | ICD-10-CM

## 2015-01-04 DIAGNOSIS — G4700 Insomnia, unspecified: Secondary | ICD-10-CM

## 2015-01-04 DIAGNOSIS — J438 Other emphysema: Secondary | ICD-10-CM

## 2015-01-04 DIAGNOSIS — M545 Low back pain: Secondary | ICD-10-CM | POA: Diagnosis not present

## 2015-01-04 DIAGNOSIS — E785 Hyperlipidemia, unspecified: Secondary | ICD-10-CM

## 2015-01-04 DIAGNOSIS — F419 Anxiety disorder, unspecified: Secondary | ICD-10-CM

## 2015-01-04 DIAGNOSIS — I428 Other cardiomyopathies: Secondary | ICD-10-CM | POA: Diagnosis not present

## 2015-01-04 DIAGNOSIS — G8929 Other chronic pain: Secondary | ICD-10-CM

## 2015-01-04 DIAGNOSIS — I739 Peripheral vascular disease, unspecified: Secondary | ICD-10-CM

## 2015-01-04 DIAGNOSIS — F329 Major depressive disorder, single episode, unspecified: Secondary | ICD-10-CM

## 2015-01-04 LAB — COMPREHENSIVE METABOLIC PANEL
ALT: 9 U/L (ref 9–46)
AST: 11 U/L (ref 10–35)
Albumin: 4.2 g/dL (ref 3.6–5.1)
Alkaline Phosphatase: 119 U/L — ABNORMAL HIGH (ref 40–115)
BILIRUBIN TOTAL: 0.4 mg/dL (ref 0.2–1.2)
BUN: 13 mg/dL (ref 7–25)
CALCIUM: 9.3 mg/dL (ref 8.6–10.3)
CHLORIDE: 103 mmol/L (ref 98–110)
CO2: 26 mmol/L (ref 20–31)
Creat: 1.52 mg/dL — ABNORMAL HIGH (ref 0.70–1.25)
GLUCOSE: 65 mg/dL (ref 65–99)
Potassium: 4.1 mmol/L (ref 3.5–5.3)
SODIUM: 138 mmol/L (ref 135–146)
Total Protein: 7.2 g/dL (ref 6.1–8.1)
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
## 2015-01-04 NOTE — Progress Notes (Signed)
Pre visit review using our clinic review tool, if applicable. No additional management support is needed unless otherwise documented below in the visit note. 

## 2015-01-31 ENCOUNTER — Other Ambulatory Visit: Payer: Self-pay | Admitting: *Deleted

## 2015-01-31 MED ORDER — HYDROCODONE-ACETAMINOPHEN 10-325 MG PO TABS: ORAL_TABLET | ORAL | Status: DC

## 2015-01-31 NOTE — Telephone Encounter (Signed)
Pt LMOM on 01/31/15 at 3:12pm requesting refill.   RF request for hydro/APAP LOV: 01/04/15 Next ov: 05/08/15 Last written: 12/17/14 #120 w/ 0RF  Please advise. Thanks.

## 2015-02-01 NOTE — Telephone Encounter (Signed)
Left message stating Rx is ready for p/u.

## 2015-02-06 ENCOUNTER — Telehealth: Payer: Self-pay | Admitting: Internal Medicine

## 2015-02-06 ENCOUNTER — Telehealth: Payer: Self-pay | Admitting: Cardiology

## 2015-02-06 ENCOUNTER — Ambulatory Visit (INDEPENDENT_AMBULATORY_CARE_PROVIDER_SITE_OTHER): Payer: Commercial Managed Care - HMO | Admitting: *Deleted

## 2015-02-06 DIAGNOSIS — I429 Cardiomyopathy, unspecified: Secondary | ICD-10-CM | POA: Diagnosis not present

## 2015-02-06 DIAGNOSIS — I5022 Chronic systolic (congestive) heart failure: Secondary | ICD-10-CM | POA: Diagnosis not present

## 2015-02-06 DIAGNOSIS — I428 Other cardiomyopathies: Secondary | ICD-10-CM

## 2015-02-06 NOTE — Telephone Encounter (Signed)
Confirmed receipt of device transmission with patient.

## 2015-02-06 NOTE — Telephone Encounter (Signed)
LMOVM reminding pt to send remote transmission.   

## 2015-02-06 NOTE — Telephone Encounter (Signed)
New Message  4. Are you calling to see if we received your device transmission? YES.    The pt wasn't able to send it but they are req a call back to determine if it was an auto transmission

## 2015-02-07 LAB — CUP PACEART REMOTE DEVICE CHECK
Brady Statistic AP VP Percent: 0.15 %
Brady Statistic AP VS Percent: 0.03 %
Brady Statistic AS VP Percent: 99.75 %
Brady Statistic RA Percent Paced: 0.17 %
Brady Statistic RV Percent Paced: 99.9 %
Date Time Interrogation Session: 20161109222205
HIGH POWER IMPEDANCE MEASURED VALUE: 50 Ohm
HighPow Impedance: 456 Ohm
HighPow Impedance: 65 Ohm
Implantable Lead Implant Date: 20130410
Implantable Lead Implant Date: 20130724
Implantable Lead Location: 753860
Implantable Lead Model: 5071
Implantable Lead Model: 5076
Implantable Lead Model: 6947
Lead Channel Impedance Value: 4047 Ohm
Lead Channel Impedance Value: 4047 Ohm
Lead Channel Impedance Value: 513 Ohm
Lead Channel Pacing Threshold Amplitude: 0.875 V
Lead Channel Pacing Threshold Pulse Width: 0.4 ms
Lead Channel Sensing Intrinsic Amplitude: 15.25 mV
Lead Channel Sensing Intrinsic Amplitude: 2.875 mV
Lead Channel Setting Pacing Amplitude: 1.75 V
Lead Channel Setting Pacing Amplitude: 2.5 V
Lead Channel Setting Pacing Pulse Width: 0.5 ms
Lead Channel Setting Sensing Sensitivity: 0.3 mV
MDC IDC LEAD IMPLANT DT: 20130410
MDC IDC LEAD IMPLANT DT: 20130410
MDC IDC LEAD IMPLANT DT: 20130410
MDC IDC LEAD LOCATION: 753858
MDC IDC LEAD LOCATION: 753858
MDC IDC LEAD LOCATION: 753859
MDC IDC LEAD LOCATION: 753862
MDC IDC LEAD MODEL: 6725
MDC IDC MSMT BATTERY VOLTAGE: 2.92 V
MDC IDC MSMT LEADCHNL LV IMPEDANCE VALUE: 494 Ohm
MDC IDC MSMT LEADCHNL LV PACING THRESHOLD AMPLITUDE: 0.625 V
MDC IDC MSMT LEADCHNL LV PACING THRESHOLD PULSEWIDTH: 0.5 ms
MDC IDC MSMT LEADCHNL RA SENSING INTR AMPL: 2.875 mV
MDC IDC MSMT LEADCHNL RV IMPEDANCE VALUE: 513 Ohm
MDC IDC MSMT LEADCHNL RV PACING THRESHOLD AMPLITUDE: 0.75 V
MDC IDC MSMT LEADCHNL RV PACING THRESHOLD PULSEWIDTH: 0.4 ms
MDC IDC MSMT LEADCHNL RV SENSING INTR AMPL: 15.25 mV
MDC IDC SET LEADCHNL LV PACING AMPLITUDE: 1.75 V
MDC IDC SET LEADCHNL RV PACING PULSEWIDTH: 0.4 ms
MDC IDC STAT BRADY AS VS PERCENT: 0.07 %

## 2015-02-07 NOTE — Progress Notes (Signed)
Remote ICD transmission.   

## 2015-02-08 ENCOUNTER — Encounter: Payer: Self-pay | Admitting: Cardiology

## 2015-02-12 ENCOUNTER — Other Ambulatory Visit: Payer: Self-pay | Admitting: Family Medicine

## 2015-02-13 NOTE — Telephone Encounter (Signed)
RF request for omeprazole LOV: 01/04/15 Next ov: 05/08/15 Last written: 04/26/14 #180 w/ 0RF

## 2015-02-14 ENCOUNTER — Other Ambulatory Visit: Payer: Self-pay | Admitting: Family Medicine

## 2015-02-15 NOTE — Telephone Encounter (Signed)
Please advise 

## 2015-03-06 ENCOUNTER — Other Ambulatory Visit: Payer: Self-pay | Admitting: Family Medicine

## 2015-03-06 NOTE — Telephone Encounter (Signed)
RF request for venlafaxine LOV: 01/04/15 Next ov:  05/08/15 Last written: 10/18/14 #270 w/ 1RF

## 2015-04-11 ENCOUNTER — Encounter (HOSPITAL_COMMUNITY): Payer: Self-pay

## 2015-04-29 ENCOUNTER — Telehealth: Payer: Self-pay | Admitting: Family Medicine

## 2015-04-29 MED ORDER — OXYCODONE-ACETAMINOPHEN 5-325 MG PO TABS: 1.0000 | ORAL_TABLET | Freq: Four times a day (QID) | ORAL | Status: DC | PRN

## 2015-04-29 NOTE — Telephone Encounter (Signed)
Patient is having a lot of back pain today. Can he get an Rx?

## 2015-04-29 NOTE — Telephone Encounter (Signed)
Percocet 5/325 rx printed (#30).

## 2015-04-29 NOTE — Telephone Encounter (Signed)
Rx put up front for p/u. Pt advised and voiced understanding.   

## 2015-04-29 NOTE — Telephone Encounter (Signed)
Spoke to pt and he stated that they hydrocodone has not been helping with his back pain. He wants to know if Dr. Milinda Cave can prescribe something else. I advised pt that he may need ov for this. He stated that he can not get in this week but might be able to next week. He stated that he only has a few hydrocodone left. Please advise. Thanks.

## 2015-05-03 ENCOUNTER — Ambulatory Visit (HOSPITAL_COMMUNITY)
Admission: RE | Admit: 2015-05-03 | Discharge: 2015-05-03 | Disposition: A | Discharge status: Home / Self Care | Payer: Medicare HMO | Source: Ambulatory Visit | Attending: Cardiology | Admitting: Cardiology

## 2015-05-03 VITALS — BP 122/63 | HR 62 | Ht 67.0 in | Wt 158.8 lb

## 2015-05-03 DIAGNOSIS — Z7982 Long term (current) use of aspirin: Secondary | ICD-10-CM | POA: Diagnosis not present

## 2015-05-03 DIAGNOSIS — I350 Nonrheumatic aortic (valve) stenosis: Secondary | ICD-10-CM | POA: Insufficient documentation

## 2015-05-03 DIAGNOSIS — N183 Chronic kidney disease, stage 3 unspecified: Secondary | ICD-10-CM

## 2015-05-03 DIAGNOSIS — E785 Hyperlipidemia, unspecified: Secondary | ICD-10-CM | POA: Diagnosis not present

## 2015-05-03 DIAGNOSIS — I6529 Occlusion and stenosis of unspecified carotid artery: Secondary | ICD-10-CM

## 2015-05-03 DIAGNOSIS — I714 Abdominal aortic aneurysm, without rupture, unspecified: Secondary | ICD-10-CM

## 2015-05-03 DIAGNOSIS — F17200 Nicotine dependence, unspecified, uncomplicated: Secondary | ICD-10-CM | POA: Diagnosis not present

## 2015-05-03 DIAGNOSIS — I2510 Atherosclerotic heart disease of native coronary artery without angina pectoris: Secondary | ICD-10-CM | POA: Diagnosis not present

## 2015-05-03 DIAGNOSIS — I5022 Chronic systolic (congestive) heart failure: Secondary | ICD-10-CM | POA: Diagnosis present

## 2015-05-03 DIAGNOSIS — Z79899 Other long term (current) drug therapy: Secondary | ICD-10-CM | POA: Insufficient documentation

## 2015-05-03 DIAGNOSIS — I420 Dilated cardiomyopathy: Secondary | ICD-10-CM | POA: Diagnosis not present

## 2015-05-03 DIAGNOSIS — R9431 Abnormal electrocardiogram [ECG] [EKG]: Secondary | ICD-10-CM | POA: Insufficient documentation

## 2015-05-03 LAB — BASIC METABOLIC PANEL
Anion gap: 9 (ref 5–15)
BUN: 7 mg/dL (ref 6–20)
CO2: 28 mmol/L (ref 22–32)
CREATININE: 1.52 mg/dL — AB (ref 0.61–1.24)
Calcium: 9.1 mg/dL (ref 8.9–10.3)
Chloride: 103 mmol/L (ref 101–111)
GFR calc Af Amer: 52 mL/min — ABNORMAL LOW (ref >60)
GFR, EST NON AFRICAN AMERICAN: 45 mL/min — AB (ref >60)
GLUCOSE: 85 mg/dL (ref 65–99)
Potassium: 4 mmol/L (ref 3.5–5.1)
SODIUM: 140 mmol/L (ref 135–145)

## 2015-05-03 LAB — LIPID PANEL
CHOL/HDL RATIO: 4.5 ratio
Cholesterol: 176 mg/dL (ref 0–200)
HDL: 39 mg/dL — AB (ref >40)
LDL CALC: 103 mg/dL — AB (ref 0–99)
Triglycerides: 171 mg/dL — ABNORMAL HIGH (ref <150)
VLDL: 34 mg/dL (ref 0–40)

## 2015-05-03 NOTE — Patient Instructions (Signed)
Labs today  Your physician has requested that you have a carotid duplex. This test is an ultrasound of the carotid arteries in your neck. It looks at blood flow through these arteries that supply the brain with blood. Allow one hour for this exam. There are no restrictions or special instructions.  IN [place]  Your physician has requested that you have an abdominal aorta duplex. During this test, an ultrasound is used to evaluate the aorta. Allow 30 minutes for this exam. Do not eat after midnight the day before and avoid carbonated beverages.  IN [place]  We will contact you in 4 months to schedule your next appointment.
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
## 2015-05-06 ENCOUNTER — Telehealth (HOSPITAL_COMMUNITY): Payer: Self-pay | Admitting: Vascular Surgery

## 2015-05-06 NOTE — Telephone Encounter (Signed)
Sent message to Annia Friendly to make Carotid and AAA appt in June

## 2015-05-07 ENCOUNTER — Other Ambulatory Visit (HOSPITAL_COMMUNITY): Payer: Self-pay | Admitting: *Deleted

## 2015-05-07 MED ORDER — ATORVASTATIN CALCIUM 80 MG PO TABS: 80.0000 mg | ORAL_TABLET | Freq: Every day | ORAL | Status: DC

## 2015-05-08 ENCOUNTER — Encounter: Payer: Self-pay | Admitting: Family Medicine

## 2015-05-08 ENCOUNTER — Ambulatory Visit (INDEPENDENT_AMBULATORY_CARE_PROVIDER_SITE_OTHER): Payer: Medicare HMO | Admitting: Family Medicine

## 2015-05-08 VITALS — BP 96/62 | HR 72 | Temp 97.3°F | Resp 16 | Ht 65.5 in | Wt 159.5 lb

## 2015-05-08 DIAGNOSIS — M545 Low back pain: Secondary | ICD-10-CM | POA: Diagnosis not present

## 2015-05-08 DIAGNOSIS — Z0000 Encounter for general adult medical examination without abnormal findings: Secondary | ICD-10-CM

## 2015-05-08 DIAGNOSIS — F331 Major depressive disorder, recurrent, moderate: Secondary | ICD-10-CM

## 2015-05-08 DIAGNOSIS — G8929 Other chronic pain: Secondary | ICD-10-CM

## 2015-05-08 MED ORDER — OXYCODONE-ACETAMINOPHEN 5-325 MG PO TABS: 1.0000 | ORAL_TABLET | Freq: Four times a day (QID) | ORAL | Status: DC | PRN

## 2015-05-08 MED ORDER — ARIPIPRAZOLE 5 MG PO TABS: 5.0000 mg | ORAL_TABLET | Freq: Every day | ORAL | Status: DC

## 2015-05-08 NOTE — Progress Notes (Signed)
The patient is here for annual Medicare wellness examination and management of other chronic and acute problems. Other problems discussed today: major depressive disorder, recurrent-moderate.   AWV DATA The risk factors are reflected in the social history.  The roster of all physicians providing medical care to patient is listed in the Snapshot section of the chart.  Activities of daily living:  The patient is 100% independent in all ADLs: dressing, toileting, feeding as well as independent mobility.  His wife keeps up with his meds and helps him dispense them correctly. He cannot drive.  Home safety : The patient has smoke detectors in the home. They wear seatbelts. No firearms at home ( firearms are present in the home, kept in a safe fashion). There is no violence in the home.   There is no risks for hepatitis, STDs or HIV. There is no history of blood transfusion. They have no travel history to infectious disease endemic areas of the world.  The patient has not seen their dentist in the last six months--says insurance doesn't cover dental care. They have not seen their eye doctor in the last year.  They admit to hearing difficulty and have not had audiologic testing in the last year.  He says he doesn't want an audiology evaluation.  They do not  have excessive sun exposure. Discussed the need for sun protection: hats, long sleeves and use of sunscreen if there is significant sun exposure.   Diet: the importance of a healthy diet is discussed. They do have a healthy (unhealthy-high fat/fast food) diet.  The patient has a regular exercise program: no exercise.  Still smoking about 6 cigs/day.  The benefits of regular aerobic exercise were discussed.  Depression screen: there are signs or vegative symptoms of depression- irritability, change in appetite, anhedonia, sadness/tearfullness.  Cognitive assessment: the patient does not manage all their financial and personal affairs but is  actively engaged.  His daughter helps pay bills/manage money.  They could relate day,date,year and events; recalled 3/3 objects at 3 minutes; performed clock-face test normally.  The following portions of the patient's history were reviewed and updated as appropriate: allergies, current medications, past family history, past medical history,  past surgical history, past social history  and problem list.  Vision, hearing, body mass index were assessed and reviewed. BMI 26.4.  During the course of the visit the patient was educated and counseled about appropriate screening and preventive services including :  Annual wellness visit  diabetes screening colorectal cancer screening recommended immunizations (influenza, pneumococcal, Hep B) Bone mass measurement Counseling to prevent tobacco use Depression screening Glaucoma screening Hepatitis C virus screening HIV virus screening Lung cancer screening Medical nutrition therapy Prostate cancer screening Screening mammography Screening pap tests, pelvic exam, and clinical breast exam Ultrasound screening for AAA  A written plan of action regarding the above screening and preventative services was given to the patient today. Pt was offered colon cancer, lung cancer, and prostate cancer screening but declined all of these today.  His immunizations are UTD.  He declined Hep C and HIV virus screening. He gets fairly regular diabetes screening. He needs to see his dentist and eye MD but his medical insurance doesn't cover this and he cannot afford the out of pocket cost. His depression screen was positive, as expected, as he has lifetime hx of recurrent major depressive disorder.  Lately feels moderately depressed, denies suicidality.  Decided to add abilify to his venlafaxine.  Start 5mg  abilify, 1/2 tab qd x  7d, then increase to whole tab qd.  Additionally, Pt said the percocet worked much better than the vicodin for his chronic LBP and he says  he thinks he can get by on 2 tabs per day.  I printed a rx today for percocet 5/325, 1-2 q6h prn, #60.

## 2015-05-08 NOTE — Progress Notes (Signed)
Pre visit review using our clinic review tool, if applicable. No additional management support is needed unless otherwise documented below in the visit note. 

## 2015-05-08 NOTE — Patient Instructions (Signed)
Abilify: take 1/2 of the 5mg  tab once a day for 7d, then increase to a whole tab once a day.

## 2015-05-09 ENCOUNTER — Encounter: Payer: Self-pay | Admitting: Internal Medicine

## 2015-05-09 ENCOUNTER — Ambulatory Visit (INDEPENDENT_AMBULATORY_CARE_PROVIDER_SITE_OTHER): Payer: Medicare HMO | Admitting: Internal Medicine

## 2015-05-09 VITALS — BP 96/60 | HR 66 | Ht 65.0 in | Wt 158.4 lb

## 2015-05-09 DIAGNOSIS — Z4502 Encounter for adjustment and management of automatic implantable cardiac defibrillator: Secondary | ICD-10-CM

## 2015-05-09 DIAGNOSIS — I428 Other cardiomyopathies: Secondary | ICD-10-CM

## 2015-05-09 DIAGNOSIS — I5022 Chronic systolic (congestive) heart failure: Secondary | ICD-10-CM

## 2015-05-09 DIAGNOSIS — Z95810 Presence of automatic (implantable) cardiac defibrillator: Secondary | ICD-10-CM

## 2015-05-09 DIAGNOSIS — I429 Cardiomyopathy, unspecified: Secondary | ICD-10-CM | POA: Diagnosis not present

## 2015-05-09 LAB — CUP PACEART INCLINIC DEVICE CHECK
Battery Voltage: 2.86 V
Brady Statistic AP VP Percent: 0.27 %
Brady Statistic AS VS Percent: 0.06 %
Brady Statistic RA Percent Paced: 0.3 %
Brady Statistic RV Percent Paced: 99.91 %
Date Time Interrogation Session: 20170209161210
HighPow Impedance: 342 Ohm
HighPow Impedance: 47 Ohm
HighPow Impedance: 61 Ohm
Implantable Lead Implant Date: 20130410
Implantable Lead Implant Date: 20130410
Implantable Lead Implant Date: 20130724
Implantable Lead Location: 753858
Implantable Lead Location: 753862
Implantable Lead Model: 5071
Implantable Lead Model: 5076
Implantable Lead Model: 6947
Lead Channel Pacing Threshold Amplitude: 0.75 V
Lead Channel Pacing Threshold Pulse Width: 0.4 ms
Lead Channel Sensing Intrinsic Amplitude: 2.625 mV
Lead Channel Setting Pacing Amplitude: 2.25 V
Lead Channel Setting Pacing Pulse Width: 0.4 ms
Lead Channel Setting Pacing Pulse Width: 0.5 ms
MDC IDC LEAD IMPLANT DT: 20130410
MDC IDC LEAD IMPLANT DT: 20130410
MDC IDC LEAD LOCATION: 753858
MDC IDC LEAD LOCATION: 753859
MDC IDC LEAD LOCATION: 753860
MDC IDC LEAD MODEL: 6725
MDC IDC MSMT LEADCHNL LV IMPEDANCE VALUE: 4047 Ohm
MDC IDC MSMT LEADCHNL LV IMPEDANCE VALUE: 4047 Ohm
MDC IDC MSMT LEADCHNL LV IMPEDANCE VALUE: 437 Ohm
MDC IDC MSMT LEADCHNL LV PACING THRESHOLD AMPLITUDE: 1 V
MDC IDC MSMT LEADCHNL LV PACING THRESHOLD PULSEWIDTH: 0.5 ms
MDC IDC MSMT LEADCHNL RA IMPEDANCE VALUE: 513 Ohm
MDC IDC MSMT LEADCHNL RA PACING THRESHOLD AMPLITUDE: 0.75 V
MDC IDC MSMT LEADCHNL RA PACING THRESHOLD PULSEWIDTH: 0.4 ms
MDC IDC MSMT LEADCHNL RV IMPEDANCE VALUE: 399 Ohm
MDC IDC MSMT LEADCHNL RV SENSING INTR AMPL: 12.125 mV
MDC IDC SET LEADCHNL RA PACING AMPLITUDE: 1.5 V
MDC IDC SET LEADCHNL RV PACING AMPLITUDE: 2.5 V
MDC IDC SET LEADCHNL RV SENSING SENSITIVITY: 0.3 mV
MDC IDC STAT BRADY AP VS PERCENT: 0.03 %
MDC IDC STAT BRADY AS VP PERCENT: 99.64 %
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
## 2015-05-09 NOTE — Patient Instructions (Signed)
Medication Instructions:  Your physician recommends that you continue on your current medications as directed. Please refer to the Current Medication list given to you today.  Labwork: None ordered  Testing/Procedures: None ordered  Follow-Up: Remote monitoring is used to monitor your Pacemaker of ICD from home. This monitoring reduces the number of office visits required to check your device to one time per year. It allows [place] to keep an eye on the functioning of your device to ensure it is working properly. You are scheduled for a device check from home on 08/08/2015. You may send your transmission at any time that day. If you have a wireless device, the transmission will be sent automatically. After your physician reviews your transmission, you will receive a postcard with your next transmission date.  Your physician wants you to follow-up in: 1 year with Dr. Ladona Ridgel. You will receive a reminder letter in the mail two months in advance. If you don't receive a letter, please call our office to schedule the follow-up appointment.   Any Other Special Instructions Will Be Listed Below (If Applicable).  If you need a refill on your cardiac medications before your next appointment, please call your pharmacy.  Thank you for choosing [facility]!!

## 2015-05-10 ENCOUNTER — Telehealth: Payer: Self-pay | Admitting: Family Medicine

## 2015-05-10 NOTE — Telephone Encounter (Signed)
Patient called and stated that the ARIPiprazole (ABILIFY) 5 MG tablet is too high 137 and he does not have kind of money. And to know if MD would call in the diazepam (VALIUM) 10 MG tablet.

## 2015-05-10 NOTE — Telephone Encounter (Signed)
Please advise. Thanks.  

## 2015-05-13 MED ORDER — DIAZEPAM 10 MG PO TABS: ORAL_TABLET | ORAL | Status: DC

## 2015-05-13 NOTE — Telephone Encounter (Signed)
Rx faxed to [facility]. Left message advising Rx has been sent to pharmacy.

## 2015-05-13 NOTE — Telephone Encounter (Signed)
Valium rx printed 

## 2015-06-06 ENCOUNTER — Ambulatory Visit: Payer: Self-pay | Admitting: Family Medicine

## 2015-06-11 ENCOUNTER — Encounter: Payer: Self-pay | Admitting: Family Medicine

## 2015-06-11 ENCOUNTER — Ambulatory Visit (INDEPENDENT_AMBULATORY_CARE_PROVIDER_SITE_OTHER): Payer: Medicare HMO | Admitting: Family Medicine

## 2015-06-11 VITALS — BP 96/62 | HR 68 | Temp 97.9°F | Resp 16 | Ht 65.0 in | Wt 159.2 lb

## 2015-06-11 DIAGNOSIS — N183 Chronic kidney disease, stage 3 (moderate): Secondary | ICD-10-CM | POA: Diagnosis not present

## 2015-06-11 DIAGNOSIS — J441 Chronic obstructive pulmonary disease with (acute) exacerbation: Secondary | ICD-10-CM | POA: Diagnosis not present

## 2015-06-11 DIAGNOSIS — G894 Chronic pain syndrome: Secondary | ICD-10-CM | POA: Diagnosis not present

## 2015-06-11 DIAGNOSIS — F331 Major depressive disorder, recurrent, moderate: Secondary | ICD-10-CM

## 2015-06-11 DIAGNOSIS — H1011 Acute atopic conjunctivitis, right eye: Secondary | ICD-10-CM

## 2015-06-11 MED ORDER — METHYLPREDNISOLONE ACETATE 80 MG/ML IJ SUSP
80.0000 mg | Freq: Once | INTRAMUSCULAR | Status: AC
  Administered 2015-06-11: 80 mg via INTRAMUSCULAR

## 2015-06-11 MED ORDER — OXYCODONE-ACETAMINOPHEN 5-325 MG PO TABS: 1.0000 | ORAL_TABLET | Freq: Four times a day (QID) | ORAL | Status: DC | PRN

## 2015-06-11 MED ORDER — FLUTICASONE FUROATE-VILANTEROL 100-25 MCG/INH IN AEPB: 1.0000 | INHALATION_SPRAY | Freq: Every day | RESPIRATORY_TRACT | Status: DC

## 2015-06-11 NOTE — Patient Instructions (Signed)
Buy OTC generic for Zaditor eye drops and use 1 drop in R eye every 12 hours for redness and itching.
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
## 2015-06-11 NOTE — Progress Notes (Signed)
Pre visit review using our clinic review tool, if applicable. No additional management support is needed unless otherwise documented below in the visit note. 

## 2015-06-12 ENCOUNTER — Encounter: Payer: Self-pay | Admitting: *Deleted

## 2015-06-12 DIAGNOSIS — M7918 Myalgia, other site: Secondary | ICD-10-CM

## 2015-06-12 DIAGNOSIS — G8929 Other chronic pain: Secondary | ICD-10-CM

## 2015-06-12 HISTORY — DX: Other chronic pain: G89.29

## 2015-06-19 ENCOUNTER — Other Ambulatory Visit (HOSPITAL_COMMUNITY): Payer: Self-pay | Admitting: *Deleted

## 2015-06-19 DIAGNOSIS — I5022 Chronic systolic (congestive) heart failure: Secondary | ICD-10-CM

## 2015-06-19 MED ORDER — SPIRONOLACTONE 25 MG PO TABS: 12.5000 mg | ORAL_TABLET | Freq: Every day | ORAL | Status: AC

## 2015-08-08 ENCOUNTER — Encounter: Payer: Medicare HMO | Admitting: *Deleted

## 2015-08-08 ENCOUNTER — Telehealth: Payer: Self-pay | Admitting: Cardiology

## 2015-08-08 NOTE — Telephone Encounter (Signed)
Confirmed remote transmission w/ pt wife.   

## 2015-08-09 ENCOUNTER — Encounter: Payer: Self-pay | Admitting: Cardiology

## 2015-08-14 ENCOUNTER — Telehealth: Payer: Self-pay | Admitting: Internal Medicine

## 2015-08-14 NOTE — Telephone Encounter (Signed)
New Message   pts wife calling. Having issue sending remote transmission. No appt schedule for remote transmission today 5/17

## 2015-08-14 NOTE — Telephone Encounter (Signed)
Spoke w/ pt and his wife. Attempted to help him send remote transmission using his wirex adapter and home monitor. After several unsuccessful attempts the wire-x was unable to get adequate cellular signal. Pt has Internet and agreed to try the wire-t Internet adapter. Ordered this for the patient and informed his wife that it would be there within the next couple of weeks. Pt wife verbalized understanding.

## 2015-08-23 ENCOUNTER — Ambulatory Visit (INDEPENDENT_AMBULATORY_CARE_PROVIDER_SITE_OTHER): Payer: Medicare HMO | Admitting: Family Medicine

## 2015-08-23 ENCOUNTER — Encounter: Payer: Self-pay | Admitting: Family Medicine

## 2015-08-23 VITALS — BP 101/66 | HR 71 | Temp 98.1°F | Resp 16 | Ht 65.0 in | Wt 155.8 lb

## 2015-08-23 DIAGNOSIS — I714 Abdominal aortic aneurysm, without rupture, unspecified: Secondary | ICD-10-CM

## 2015-08-23 DIAGNOSIS — K0889 Other specified disorders of teeth and supporting structures: Secondary | ICD-10-CM

## 2015-08-23 DIAGNOSIS — K0510 Chronic gingivitis, plaque induced: Secondary | ICD-10-CM | POA: Diagnosis not present

## 2015-08-23 DIAGNOSIS — G894 Chronic pain syndrome: Secondary | ICD-10-CM

## 2015-08-23 MED ORDER — CLINDAMYCIN HCL 300 MG PO CAPS: 300.0000 mg | ORAL_CAPSULE | Freq: Three times a day (TID) | ORAL | Status: DC

## 2015-08-23 MED ORDER — TRAMADOL HCL 50 MG PO TABS: ORAL_TABLET | ORAL | Status: DC

## 2015-08-23 NOTE — Progress Notes (Signed)
Pre visit review using our clinic review tool, if applicable. No additional management support is needed unless otherwise documented below in the visit note. 
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
## 2015-09-09 ENCOUNTER — Inpatient Hospital Stay (HOSPITAL_COMMUNITY): Admission: RE | Admit: 2015-09-09 | Payer: Self-pay | Source: Ambulatory Visit

## 2015-09-09 ENCOUNTER — Encounter (HOSPITAL_COMMUNITY): Payer: Self-pay

## 2015-09-13 ENCOUNTER — Telehealth: Payer: Self-pay | Admitting: Family Medicine

## 2015-09-13 ENCOUNTER — Ambulatory Visit: Payer: Medicare HMO | Admitting: Family Medicine

## 2015-09-13 ENCOUNTER — Telehealth: Payer: Self-pay | Admitting: *Deleted

## 2015-09-13 MED ORDER — OMEPRAZOLE 20 MG PO CPDR: DELAYED_RELEASE_CAPSULE | ORAL | Status: AC

## 2015-09-13 MED ORDER — HYDROCODONE-ACETAMINOPHEN 10-325 MG PO TABS: ORAL_TABLET | ORAL | Status: DC

## 2015-09-13 NOTE — Telephone Encounter (Signed)
Pt came by office today with his Rx for tramadol printed 08/23/15. He stated that this medication does not help him and he would like to have his pain medication switched back to the hydro/apap, he is currently taking osyc/apap which he stated does not help as much has the hydro/apap did. Please advise. Thanks.

## 2015-09-13 NOTE — Telephone Encounter (Signed)
RF request for omeprazole LOV: 06/11/15 Next ov: 12/17/15 Last written: 02/13/15 #180 w/ 3RF - this rx was sent to mail order, pt no longer uses mail order.   Rx sent to [facility] per pts request.

## 2015-09-13 NOTE — Telephone Encounter (Signed)
OK.  Rx for Vicodin 10/325, 1 tab qid prn, #120 printed.

## 2015-09-13 NOTE — Telephone Encounter (Signed)
Patient requesting refill of omeprazole (PRILOSEC) 20 MG capsule.  Pharmacy: [facility] [address], [place] - [address]

## 2015-09-16 NOTE — Telephone Encounter (Signed)
Left message stating Rx is ready for p/u at front desk.

## 2015-09-17 ENCOUNTER — Ambulatory Visit (INDEPENDENT_AMBULATORY_CARE_PROVIDER_SITE_OTHER): Payer: Medicare HMO | Admitting: *Deleted

## 2015-09-17 DIAGNOSIS — I5022 Chronic systolic (congestive) heart failure: Secondary | ICD-10-CM

## 2015-09-17 DIAGNOSIS — Z95810 Presence of automatic (implantable) cardiac defibrillator: Secondary | ICD-10-CM

## 2015-09-17 DIAGNOSIS — I429 Cardiomyopathy, unspecified: Secondary | ICD-10-CM

## 2015-09-17 DIAGNOSIS — I428 Other cardiomyopathies: Secondary | ICD-10-CM

## 2015-09-18 LAB — CUP PACEART REMOTE DEVICE CHECK
Battery Voltage: 2.71 V
Brady Statistic RA Percent Paced: 0.2 %
Date Time Interrogation Session: 20170620190223
HIGH POWER IMPEDANCE MEASURED VALUE: 45 Ohm
HighPow Impedance: 342 Ohm
HighPow Impedance: 57 Ohm
Implantable Lead Implant Date: 20130410
Implantable Lead Implant Date: 20130410
Implantable Lead Implant Date: 20130410
Implantable Lead Implant Date: 20130724
Implantable Lead Location: 753858
Implantable Lead Location: 753859
Implantable Lead Location: 753860
Implantable Lead Model: 5076
Implantable Lead Model: 6947
Lead Channel Impedance Value: 399 Ohm
Lead Channel Impedance Value: 4047 Ohm
Lead Channel Impedance Value: 494 Ohm
Lead Channel Pacing Threshold Amplitude: 0.875 V
Lead Channel Pacing Threshold Amplitude: 1 V
Lead Channel Pacing Threshold Pulse Width: 0.4 ms
Lead Channel Pacing Threshold Pulse Width: 0.5 ms
Lead Channel Sensing Intrinsic Amplitude: 10.5 mV
Lead Channel Sensing Intrinsic Amplitude: 10.5 mV
Lead Channel Sensing Intrinsic Amplitude: 3.125 mV
Lead Channel Setting Pacing Amplitude: 2.5 V
Lead Channel Setting Pacing Pulse Width: 0.4 ms
Lead Channel Setting Sensing Sensitivity: 0.3 mV
MDC IDC LEAD IMPLANT DT: 20130410
MDC IDC LEAD LOCATION: 753858
MDC IDC LEAD LOCATION: 753862
MDC IDC LEAD MODEL: 6725
MDC IDC MSMT LEADCHNL LV IMPEDANCE VALUE: 4047 Ohm
MDC IDC MSMT LEADCHNL LV IMPEDANCE VALUE: 437 Ohm
MDC IDC MSMT LEADCHNL RA PACING THRESHOLD PULSEWIDTH: 0.4 ms
MDC IDC MSMT LEADCHNL RA SENSING INTR AMPL: 3.125 mV
MDC IDC MSMT LEADCHNL RV PACING THRESHOLD AMPLITUDE: 1 V
MDC IDC SET LEADCHNL LV PACING AMPLITUDE: 2 V
MDC IDC SET LEADCHNL LV PACING PULSEWIDTH: 0.5 ms
MDC IDC SET LEADCHNL RA PACING AMPLITUDE: 1.75 V
MDC IDC STAT BRADY AP VP PERCENT: 0.17 %
MDC IDC STAT BRADY AP VS PERCENT: 0.02 %
MDC IDC STAT BRADY AS VP PERCENT: 99.76 %
MDC IDC STAT BRADY AS VS PERCENT: 0.05 %
MDC IDC STAT BRADY RV PERCENT PACED: 99.93 %

## 2015-09-18 NOTE — Progress Notes (Signed)
Remote ICD transmission.   

## 2015-09-20 ENCOUNTER — Encounter: Payer: Self-pay | Admitting: Cardiology

## 2015-09-26 ENCOUNTER — Telehealth: Payer: Self-pay | Admitting: Internal Medicine

## 2015-09-26 NOTE — Telephone Encounter (Signed)
Spoke w/ pt wife and informed her that we did receive pt remote transmission. Gave pt wife results from transmission.

## 2015-09-26 NOTE — Telephone Encounter (Signed)
Per pt call:  Wife called for pt wants to know if his transmission went through.  Please give him a call.

## 2015-10-08 ENCOUNTER — Ambulatory Visit (HOSPITAL_COMMUNITY)
Admission: RE | Admit: 2015-10-08 | Discharge: 2015-10-08 | Disposition: A | Discharge status: Home / Self Care | Payer: Medicare HMO | Source: Ambulatory Visit | Attending: Cardiology | Admitting: Cardiology

## 2015-10-08 DIAGNOSIS — I6523 Occlusion and stenosis of bilateral carotid arteries: Secondary | ICD-10-CM | POA: Diagnosis not present

## 2015-10-08 DIAGNOSIS — E785 Hyperlipidemia, unspecified: Secondary | ICD-10-CM | POA: Diagnosis not present

## 2015-10-08 DIAGNOSIS — J449 Chronic obstructive pulmonary disease, unspecified: Secondary | ICD-10-CM | POA: Diagnosis not present

## 2015-10-08 DIAGNOSIS — I739 Peripheral vascular disease, unspecified: Secondary | ICD-10-CM | POA: Diagnosis not present

## 2015-10-08 DIAGNOSIS — I700 Atherosclerosis of aorta: Secondary | ICD-10-CM | POA: Insufficient documentation

## 2015-10-08 DIAGNOSIS — I2510 Atherosclerotic heart disease of native coronary artery without angina pectoris: Secondary | ICD-10-CM | POA: Diagnosis not present

## 2015-10-08 DIAGNOSIS — I6529 Occlusion and stenosis of unspecified carotid artery: Secondary | ICD-10-CM

## 2015-10-08 DIAGNOSIS — I714 Abdominal aortic aneurysm, without rupture, unspecified: Secondary | ICD-10-CM

## 2015-10-10 ENCOUNTER — Telehealth: Payer: Self-pay | Admitting: Family Medicine

## 2015-10-10 ENCOUNTER — Encounter: Payer: Self-pay | Admitting: Family Medicine

## 2015-10-10 NOTE — Telephone Encounter (Signed)
Please advise. Thanks.  

## 2015-10-10 NOTE — Telephone Encounter (Signed)
Patient's daughter feels that patient is an endangerment to himself and to his family. She is asking for any recommendations on any resources the family has on a limited budget.

## 2015-10-28 ENCOUNTER — Other Ambulatory Visit (HOSPITAL_COMMUNITY): Payer: Self-pay | Admitting: *Deleted

## 2015-10-28 DIAGNOSIS — I5022 Chronic systolic (congestive) heart failure: Secondary | ICD-10-CM

## 2015-10-28 MED ORDER — CARVEDILOL 12.5 MG PO TABS: 12.5000 mg | ORAL_TABLET | Freq: Two times a day (BID) | ORAL | 3 refills | Status: DC

## 2015-10-28 MED ORDER — RAMIPRIL 5 MG PO CAPS: 5.0000 mg | ORAL_CAPSULE | Freq: Every day | ORAL | 3 refills | Status: DC

## 2015-11-07 ENCOUNTER — Other Ambulatory Visit: Payer: Self-pay | Admitting: *Deleted

## 2015-11-07 MED ORDER — VENLAFAXINE HCL ER 75 MG PO CP24: ORAL_CAPSULE | ORAL | 1 refills | Status: AC

## 2015-11-07 MED ORDER — HYDROCODONE-ACETAMINOPHEN 10-325 MG PO TABS: ORAL_TABLET | ORAL | 0 refills | Status: AC

## 2015-11-07 NOTE — Telephone Encounter (Signed)
Pts wife LMOM on 11/07/15 at 1:39pm requesting refill for the following medications:  RF request for hydro/apap LOV: 06/11/15 Next ov: 12/17/15 Last written: 09/13/15 #120 w/ 0RF  Please advise. Thanks.   RF request for effexor - send to [facility] [place] 90 day supply Last written: 03/06/15 #120 w/ 3RF  Rx sent.

## 2015-11-08 ENCOUNTER — Telehealth: Payer: Self-pay | Admitting: *Deleted

## 2015-11-08 NOTE — Telephone Encounter (Signed)
Rx put up front for p/u. Left message for pts wife stating that Rx (name of medication not mentioned) was ready for p/u at our front desk.

## 2015-11-08 NOTE — Telephone Encounter (Signed)
PA for Venlafaxine sent to plan on 11/08/15 via covermymeds. Key: [id number].

## 2015-11-11 NOTE — Telephone Encounter (Signed)
PA apporved. Effective: 03/31/15 Exp: 03/29/16

## 2015-11-14 ENCOUNTER — Telehealth: Payer: Self-pay

## 2015-11-14 NOTE — Progress Notes (Signed)
1st ICM remote transmission scheduled for 11/27/2015.

## 2015-11-14 NOTE — Telephone Encounter (Signed)
Patient referred to [facility] clinic by Acie Fredrickson, device RN/Dr Graciela Husbands.  Spoke with patient and explained ICM program.  He agreed to monthly ICM calls and 1st transmission scheduled for 11/27/2015.  He reported feeling ok today.

## 2015-11-26 ENCOUNTER — Encounter (HOSPITAL_COMMUNITY): Payer: Self-pay | Admitting: *Deleted

## 2015-11-26 ENCOUNTER — Encounter (HOSPITAL_COMMUNITY): Payer: Self-pay

## 2015-11-26 ENCOUNTER — Ambulatory Visit (HOSPITAL_COMMUNITY)
Admission: RE | Admit: 2015-11-26 | Discharge: 2015-11-26 | Disposition: A | Discharge status: Home / Self Care | Payer: Medicare HMO | Source: Ambulatory Visit | Attending: Cardiology | Admitting: Cardiology

## 2015-11-26 VITALS — BP 102/60 | HR 64 | Wt 157.0 lb

## 2015-11-26 DIAGNOSIS — I2510 Atherosclerotic heart disease of native coronary artery without angina pectoris: Secondary | ICD-10-CM | POA: Insufficient documentation

## 2015-11-26 DIAGNOSIS — R0989 Other specified symptoms and signs involving the circulatory and respiratory systems: Secondary | ICD-10-CM

## 2015-11-26 DIAGNOSIS — Z7982 Long term (current) use of aspirin: Secondary | ICD-10-CM | POA: Diagnosis not present

## 2015-11-26 DIAGNOSIS — I350 Nonrheumatic aortic (valve) stenosis: Secondary | ICD-10-CM | POA: Diagnosis not present

## 2015-11-26 DIAGNOSIS — M545 Low back pain: Secondary | ICD-10-CM | POA: Insufficient documentation

## 2015-11-26 DIAGNOSIS — Z880 Allergy status to penicillin: Secondary | ICD-10-CM | POA: Insufficient documentation

## 2015-11-26 DIAGNOSIS — K219 Gastro-esophageal reflux disease without esophagitis: Secondary | ICD-10-CM | POA: Diagnosis not present

## 2015-11-26 DIAGNOSIS — I5022 Chronic systolic (congestive) heart failure: Secondary | ICD-10-CM | POA: Insufficient documentation

## 2015-11-26 DIAGNOSIS — I6523 Occlusion and stenosis of bilateral carotid arteries: Secondary | ICD-10-CM | POA: Diagnosis not present

## 2015-11-26 DIAGNOSIS — F329 Major depressive disorder, single episode, unspecified: Secondary | ICD-10-CM | POA: Diagnosis not present

## 2015-11-26 DIAGNOSIS — J449 Chronic obstructive pulmonary disease, unspecified: Secondary | ICD-10-CM | POA: Diagnosis not present

## 2015-11-26 DIAGNOSIS — Z953 Presence of xenogenic heart valve: Secondary | ICD-10-CM | POA: Diagnosis not present

## 2015-11-26 DIAGNOSIS — G8929 Other chronic pain: Secondary | ICD-10-CM | POA: Insufficient documentation

## 2015-11-26 DIAGNOSIS — I714 Abdominal aortic aneurysm, without rupture, unspecified: Secondary | ICD-10-CM

## 2015-11-26 DIAGNOSIS — Z95810 Presence of automatic (implantable) cardiac defibrillator: Secondary | ICD-10-CM | POA: Diagnosis not present

## 2015-11-26 DIAGNOSIS — N183 Chronic kidney disease, stage 3 (moderate): Secondary | ICD-10-CM | POA: Insufficient documentation

## 2015-11-26 DIAGNOSIS — I428 Other cardiomyopathies: Secondary | ICD-10-CM | POA: Insufficient documentation

## 2015-11-26 DIAGNOSIS — E785 Hyperlipidemia, unspecified: Secondary | ICD-10-CM | POA: Diagnosis not present

## 2015-11-26 DIAGNOSIS — Z79899 Other long term (current) drug therapy: Secondary | ICD-10-CM | POA: Insufficient documentation

## 2015-11-26 DIAGNOSIS — F17210 Nicotine dependence, cigarettes, uncomplicated: Secondary | ICD-10-CM | POA: Insufficient documentation

## 2015-11-26 LAB — LIPID PANEL
CHOLESTEROL: 152 mg/dL (ref 0–200)
HDL: 42 mg/dL (ref >40)
LDL Cholesterol: 76 mg/dL (ref 0–99)
Total CHOL/HDL Ratio: 3.6 RATIO
Triglycerides: 170 mg/dL — ABNORMAL HIGH (ref <150)
VLDL: 34 mg/dL (ref 0–40)

## 2015-11-26 LAB — CBC
HCT: 41.7 % (ref 39.0–52.0)
Hemoglobin: 13.3 g/dL (ref 13.0–17.0)
MCH: 29.7 pg (ref 26.0–34.0)
MCHC: 31.9 g/dL (ref 30.0–36.0)
MCV: 93.1 fL (ref 78.0–100.0)
PLATELETS: 220 10*3/uL (ref 150–400)
RBC: 4.48 MIL/uL (ref 4.22–5.81)
RDW: 13.4 % (ref 11.5–15.5)
WBC: 10.1 10*3/uL (ref 4.0–10.5)

## 2015-11-26 LAB — BASIC METABOLIC PANEL
Anion gap: 7 (ref 5–15)
BUN: 10 mg/dL (ref 6–20)
CALCIUM: 9.1 mg/dL (ref 8.9–10.3)
CO2: 27 mmol/L (ref 22–32)
CREATININE: 1.35 mg/dL — AB (ref 0.61–1.24)
Chloride: 105 mmol/L (ref 101–111)
GFR calc Af Amer: 60 mL/min — ABNORMAL LOW (ref >60)
GFR calc non Af Amer: 52 mL/min — ABNORMAL LOW (ref >60)
GLUCOSE: 101 mg/dL — AB (ref 65–99)
Potassium: 3.3 mmol/L — ABNORMAL LOW (ref 3.5–5.1)
Sodium: 139 mmol/L (ref 135–145)

## 2015-11-26 NOTE — Patient Instructions (Signed)
Labs today  Your physician has requested that you have an echocardiogram. Echocardiography is a painless test that uses sound waves to create images of your heart. It provides your doctor with information about the size and shape of your heart and how well your heart's chambers and valves are working. This procedure takes approximately one hour. There are no restrictions for this procedure.  We will contact you in 6 months to schedule your next appointment.  

## 2015-11-27 ENCOUNTER — Telehealth (HOSPITAL_COMMUNITY): Payer: Self-pay | Admitting: *Deleted

## 2015-11-27 ENCOUNTER — Telehealth: Payer: Self-pay | Admitting: Cardiology

## 2015-11-27 DIAGNOSIS — I5022 Chronic systolic (congestive) heart failure: Secondary | ICD-10-CM

## 2015-11-27 MED ORDER — POTASSIUM CHLORIDE CRYS ER 20 MEQ PO TBCR: 20.0000 meq | EXTENDED_RELEASE_TABLET | Freq: Every day | ORAL | 3 refills | Status: DC

## 2015-11-27 NOTE — Telephone Encounter (Signed)
Notes Recorded by Modesta Messing, CMA on 11/27/2015 at 3:58 PM EDT Patient aware. Spoke with patients wife. Potassium sent to pharmacy. Patient added to lab schedule 9/11 ------  Notes Recorded by Modesta Messing, CMA on 11/26/2015 at 5:02 PM EDT Left message for patient to call back.  ------  Notes Recorded by Laurey Morale, MD on 11/26/2015 at 4:32 PM EDT Add additional KCl 20 mEq daily with low K. BMET 10 days.    Ref Range & Units 1d ago 11mo ago 50yr ago   Cholesterol 0 - 200 mg/dL 676 195 [id number]   Triglycerides <150 mg/dL 712  <458 mg/dL" class="[id number]">250  181.0R, CM    HDL >40 mg/dL 42 39  53.97Q    Total CHOL/HDL Ratio RATIO 3.6 4.5 4R, CM   VLDL 0 - 40 mg/dL 34 34 73.4L   LDL Cholesterol 0 - 99 mg/dL 76 [id number]  59

## 2015-11-27 NOTE — Telephone Encounter (Signed)
Confirmed remote transmission w/ pt wife.   
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
## 2015-11-29 DIAGNOSIS — E559 Vitamin D deficiency, unspecified: Secondary | ICD-10-CM

## 2015-11-29 HISTORY — DX: Vitamin D deficiency, unspecified: E55.9

## 2015-11-29 NOTE — Progress Notes (Signed)
ICM remote transmission rescheduled for 01/01/2016 due to office check with HF clinic on 12/18/2015.

## 2015-12-09 ENCOUNTER — Other Ambulatory Visit (HOSPITAL_COMMUNITY): Payer: Self-pay

## 2015-12-09 ENCOUNTER — Other Ambulatory Visit (HOSPITAL_COMMUNITY): Payer: Medicare HMO

## 2015-12-10 ENCOUNTER — Encounter: Payer: Self-pay | Admitting: Internal Medicine

## 2015-12-12 ENCOUNTER — Other Ambulatory Visit (HOSPITAL_COMMUNITY): Payer: Medicare HMO

## 2015-12-16 ENCOUNTER — Ambulatory Visit (INDEPENDENT_AMBULATORY_CARE_PROVIDER_SITE_OTHER): Payer: Medicare HMO | Admitting: Family Medicine

## 2015-12-16 ENCOUNTER — Encounter: Payer: Self-pay | Admitting: Family Medicine

## 2015-12-16 VITALS — BP 95/63 | HR 83 | Temp 97.9°F | Resp 20 | Wt 152.0 lb

## 2015-12-16 DIAGNOSIS — G4700 Insomnia, unspecified: Secondary | ICD-10-CM | POA: Diagnosis not present

## 2015-12-16 DIAGNOSIS — F329 Major depressive disorder, single episode, unspecified: Secondary | ICD-10-CM

## 2015-12-16 DIAGNOSIS — M791 Myalgia: Secondary | ICD-10-CM

## 2015-12-16 DIAGNOSIS — G8929 Other chronic pain: Secondary | ICD-10-CM

## 2015-12-16 DIAGNOSIS — M7918 Myalgia, other site: Secondary | ICD-10-CM

## 2015-12-16 DIAGNOSIS — F32A Depression, unspecified: Secondary | ICD-10-CM

## 2015-12-16 DIAGNOSIS — I5022 Chronic systolic (congestive) heart failure: Secondary | ICD-10-CM

## 2015-12-16 MED ORDER — TRAZODONE HCL 50 MG PO TABS: ORAL_TABLET | ORAL | 2 refills | Status: DC
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
## 2015-12-16 NOTE — Progress Notes (Signed)
Pre visit review using our clinic review tool, if applicable. No additional management support is needed unless otherwise documented below in the visit note. 

## 2015-12-17 ENCOUNTER — Ambulatory Visit: Payer: Medicare HMO | Admitting: Family Medicine

## 2015-12-18 ENCOUNTER — Ambulatory Visit (INDEPENDENT_AMBULATORY_CARE_PROVIDER_SITE_OTHER): Payer: Medicare HMO | Admitting: *Deleted

## 2015-12-18 ENCOUNTER — Telehealth: Payer: Self-pay

## 2015-12-18 DIAGNOSIS — I429 Cardiomyopathy, unspecified: Secondary | ICD-10-CM | POA: Diagnosis not present

## 2015-12-18 DIAGNOSIS — I428 Other cardiomyopathies: Secondary | ICD-10-CM

## 2015-12-18 NOTE — Telephone Encounter (Signed)
Attempted ICM remote transmission call and left voice to send remote transmission today.  Left call back number if he has any questions.

## 2015-12-20 ENCOUNTER — Encounter: Payer: Self-pay | Admitting: Cardiology

## 2015-12-20 NOTE — Progress Notes (Signed)
No ICM remote transmission received for 12/18/2015 and next ICM transmission scheduled for 01/20/2016.    

## 2015-12-20 NOTE — Progress Notes (Signed)
Letter  

## 2015-12-24 ENCOUNTER — Encounter: Payer: Self-pay | Admitting: Cardiology

## 2015-12-24 NOTE — Progress Notes (Signed)
Remote ICD transmission.   

## 2015-12-26 ENCOUNTER — Telehealth: Payer: Self-pay | Admitting: Family Medicine

## 2015-12-26 NOTE — Telephone Encounter (Signed)
Patient called and stated he is having difficulty remembering things and he is rather concerned with it. He would like a call back as to what he needs to do.

## 2015-12-27 NOTE — Telephone Encounter (Signed)
Spoke to patient, he stated that he is having a hard time remembering certain things.  Offered to schedule him appointment but he said that he couldn't get here.

## 2015-12-31 ENCOUNTER — Telehealth: Payer: Self-pay | Admitting: Family Medicine

## 2015-12-31 NOTE — Telephone Encounter (Signed)
Patient called and wanted to speak with with the office manager. Stated he had called last week and no one returned his call. I advised him that the day he called (Sept 28) Dr Milinda Cave was out of the office. Patient said that he would have appreciated a call after hours to discuss his issue. I again explained that Dr. Milinda Cave was out of the office so he would not have seen the message.   I gently reminded Mr [place]a that the CMA did call him back the next morning on Dr Verdis Frederickson return and said that he would need an appointment to discuss his memory issues and that he had declined to come in.   Mr Dryer said that he feels he should have been able to talk with the MD. I advised him that an appointment was in his best interest so the MD could best assess  his condition. He became frustrated and said that he no longer wanted to see anyone in this office. Said that he would take his care to [facility]. I apologized but he was aggitated and hung up.

## 2016-01-01 NOTE — Telephone Encounter (Signed)
Noted  

## 2016-01-03 ENCOUNTER — Other Ambulatory Visit (HOSPITAL_COMMUNITY): Payer: Medicare HMO

## 2016-01-09 LAB — CUP PACEART REMOTE DEVICE CHECK
Battery Voltage: 2.64 V
Brady Statistic AP VP Percent: 0.15 %
Brady Statistic RA Percent Paced: 0.17 %
HighPow Impedance: 494 Ohm
HighPow Impedance: 50 Ohm
HighPow Impedance: 64 Ohm
Implantable Lead Implant Date: 20130410
Implantable Lead Implant Date: 20130410
Implantable Lead Implant Date: 20130724
Implantable Lead Location: 753858
Implantable Lead Location: 753859
Implantable Lead Location: 753860
Implantable Lead Model: 5076
Implantable Lead Model: 6947
Lead Channel Impedance Value: 513 Ohm
Lead Channel Pacing Threshold Amplitude: 0.875 V
Lead Channel Pacing Threshold Pulse Width: 0.4 ms
Lead Channel Sensing Intrinsic Amplitude: 11.875 mV
Lead Channel Sensing Intrinsic Amplitude: 11.875 mV
Lead Channel Sensing Intrinsic Amplitude: 3.375 mV
Lead Channel Sensing Intrinsic Amplitude: 3.375 mV
Lead Channel Setting Pacing Pulse Width: 0.5 ms
Lead Channel Setting Sensing Sensitivity: 0.3 mV
MDC IDC LEAD IMPLANT DT: 20130410
MDC IDC LEAD IMPLANT DT: 20130410
MDC IDC LEAD LOCATION: 753858
MDC IDC LEAD LOCATION: 753862
MDC IDC LEAD MODEL: 6725
MDC IDC MSMT LEADCHNL LV IMPEDANCE VALUE: 4047 Ohm
MDC IDC MSMT LEADCHNL LV IMPEDANCE VALUE: 4047 Ohm
MDC IDC MSMT LEADCHNL LV IMPEDANCE VALUE: 494 Ohm
MDC IDC MSMT LEADCHNL LV PACING THRESHOLD PULSEWIDTH: 0.5 ms
MDC IDC MSMT LEADCHNL RA PACING THRESHOLD AMPLITUDE: 0.75 V
MDC IDC MSMT LEADCHNL RA PACING THRESHOLD PULSEWIDTH: 0.4 ms
MDC IDC MSMT LEADCHNL RV IMPEDANCE VALUE: 551 Ohm
MDC IDC MSMT LEADCHNL RV PACING THRESHOLD AMPLITUDE: 0.75 V
MDC IDC SESS DTM: 20170922185416
MDC IDC SET LEADCHNL LV PACING AMPLITUDE: 2 V
MDC IDC SET LEADCHNL RA PACING AMPLITUDE: 1.5 V
MDC IDC SET LEADCHNL RV PACING AMPLITUDE: 2.5 V
MDC IDC SET LEADCHNL RV PACING PULSEWIDTH: 0.4 ms
MDC IDC STAT BRADY AP VS PERCENT: 0.03 %
MDC IDC STAT BRADY AS VP PERCENT: 99.75 %
MDC IDC STAT BRADY AS VS PERCENT: 0.07 %
MDC IDC STAT BRADY RV PERCENT PACED: 99.9 %

## 2016-01-10 ENCOUNTER — Encounter (HOSPITAL_COMMUNITY): Payer: Self-pay | Admitting: *Deleted

## 2016-01-13 ENCOUNTER — Ambulatory Visit: Payer: Medicare HMO

## 2016-01-20 ENCOUNTER — Ambulatory Visit (HOSPITAL_COMMUNITY)
Admission: RE | Admit: 2016-01-20 | Discharge: 2016-01-20 | Disposition: A | Discharge status: Home / Self Care | Payer: Medicare HMO | Source: Ambulatory Visit | Attending: Cardiology | Admitting: Cardiology

## 2016-01-20 ENCOUNTER — Telehealth: Payer: Self-pay | Admitting: Cardiology

## 2016-01-20 DIAGNOSIS — I5023 Acute on chronic systolic (congestive) heart failure: Secondary | ICD-10-CM | POA: Diagnosis not present

## 2016-01-20 DIAGNOSIS — I5022 Chronic systolic (congestive) heart failure: Secondary | ICD-10-CM | POA: Insufficient documentation

## 2016-01-20 LAB — BASIC METABOLIC PANEL
ANION GAP: 7 (ref 5–15)
BUN: 17 mg/dL (ref 6–20)
CALCIUM: 9.5 mg/dL (ref 8.9–10.3)
CO2: 25 mmol/L (ref 22–32)
CREATININE: 1.82 mg/dL — AB (ref 0.61–1.24)
Chloride: 105 mmol/L (ref 101–111)
GFR calc Af Amer: 42 mL/min — ABNORMAL LOW (ref >60)
GFR, EST NON AFRICAN AMERICAN: 36 mL/min — AB (ref >60)
GLUCOSE: 53 mg/dL — AB (ref 65–99)
Potassium: 4.4 mmol/L (ref 3.5–5.1)
Sodium: 137 mmol/L (ref 135–145)

## 2016-01-20 NOTE — Telephone Encounter (Signed)
Spoke with pt and reminded pt of remote transmission that is due today. Pt verbalized understanding.   

## 2016-01-21 ENCOUNTER — Telehealth (HOSPITAL_COMMUNITY): Payer: Self-pay | Admitting: *Deleted

## 2016-01-21 DIAGNOSIS — I5022 Chronic systolic (congestive) heart failure: Secondary | ICD-10-CM

## 2016-01-21 MED ORDER — RAMIPRIL 2.5 MG PO CAPS: 2.5000 mg | ORAL_CAPSULE | Freq: Every day | ORAL | 3 refills | Status: AC

## 2016-01-21 NOTE — Telephone Encounter (Signed)
Notes Recorded by Modesta Messing, CMA on 01/21/2016 at 4:12 PM EDT Patient aware. Spoke with patients wife she is aware of medication change and results. Added to lab schedule 10/31.   ------  Notes Recorded by Laurey Morale, MD on 01/21/2016 at 4:00 PM EDT Elevated creatinine. Decrease ramipril to 2.5 mg daily and repeat BMET in 1 week.    Ref Range & Units 1d ago 83mo ago 44mo ago   Sodium 135 - 145 mmol/L 137  139  140    Potassium 3.5 - 5.1 mmol/L 4.4  3.3   4.0    Chloride 101 - 111 mmol/L 105  105  103    CO2 22 - 32 mmol/L 25  27  28     Glucose, Bld 65 - 99 mg/dL 53   263   85    BUN 6 - 20 mg/dL 17  10  7     Creatinine, Ser 0.61 - 1.24 mg/dL 3.35   4.56   2.56     Calcium 8.9 - 10.3 mg/dL 9.5  9.1  9.1    GFR calc non Af Amer >60 mL/min 36   52   45     GFR calc Af Amer >60 mL/min 42   60CM   52CM

## 2016-01-21 NOTE — Telephone Encounter (Signed)
-----   Message from Laurey Morale, MD sent at 01/21/2016  4:00 PM EDT ----- Elevated creatinine.  Decrease ramipril to 2.5 mg daily and repeat BMET in 1 week.

## 2016-01-24 NOTE — Progress Notes (Signed)
No ICM remote transmissions received since patient agreed to enrollment on 11/14/2015.   Will attempt to re-enroll at a later date since patient is not participating in program at this time.  3 month remote monitoring will continue.

## 2016-01-28 ENCOUNTER — Telehealth (HOSPITAL_COMMUNITY): Payer: Self-pay | Admitting: Vascular Surgery

## 2016-01-28 ENCOUNTER — Inpatient Hospital Stay (HOSPITAL_COMMUNITY): Admission: RE | Admit: 2016-01-28 | Payer: Medicare HMO | Source: Ambulatory Visit

## 2016-01-28 NOTE — Telephone Encounter (Signed)
Pt called to canceled lab apt, he states he is very upset w/ [facility] @ [address]

## 2016-01-31 ENCOUNTER — Other Ambulatory Visit (HOSPITAL_COMMUNITY): Payer: Medicare HMO

## 2016-02-14 ENCOUNTER — Telehealth: Payer: Self-pay | Admitting: Internal Medicine

## 2016-02-14 NOTE — Telephone Encounter (Signed)
I called and spoke with the patient. He states that, per his wife, a message was left yesterday, to call back. They thought this was from Dr. Alford Highland office. I advised the patient I do not see any documentation that someone tried to call him, but will forward to the CHF clinic & Katina Dung, RN for Dr. Shirlee Latch in our [facility] office to see they were trying to reach him. He is very appreciative and grateful for the call back.

## 2016-02-14 NOTE — Telephone Encounter (Signed)
New Message  Pt call stating he recieved call on 11/16. Please call back to discuss

## 2016-02-14 NOTE — Telephone Encounter (Signed)
I did not call pt for any reason

## 2016-03-12 ENCOUNTER — Telehealth (HOSPITAL_COMMUNITY): Payer: Self-pay | Admitting: Cardiology

## 2016-03-12 NOTE — Telephone Encounter (Signed)
Called pt and lmsg giving him a new appt date and time of 12/21 at 4:00pm for his echo. The technician will not be in the office to perform the procedure at that time.

## 2016-03-18 ENCOUNTER — Other Ambulatory Visit (HOSPITAL_COMMUNITY): Payer: Medicare HMO

## 2016-03-19 ENCOUNTER — Other Ambulatory Visit (HOSPITAL_COMMUNITY): Payer: Medicare HMO

## 2016-03-25 ENCOUNTER — Telehealth: Payer: Self-pay | Admitting: Cardiology

## 2016-03-25 ENCOUNTER — Encounter: Payer: Medicare HMO | Admitting: *Deleted

## 2016-03-25 NOTE — Telephone Encounter (Signed)
LMOVM reminding pt to send remote transmission.   

## 2016-03-27 ENCOUNTER — Encounter: Payer: Self-pay | Admitting: Cardiology

## 2016-04-06 ENCOUNTER — Encounter: Payer: Self-pay | Admitting: Radiology

## 2016-04-14 ENCOUNTER — Telehealth: Payer: Self-pay | Admitting: Cardiology

## 2016-04-14 NOTE — Telephone Encounter (Signed)
Spoke w/ pt and informed him that we received his remote transmission and that it made [place] aware that his device has reached ERI. Informed pt that this means there is about 3 months remaining of battery life and that a scheduler will call him to schedule an appt w/ MD / PA / NP. Pt verbalized understanding.

## 2016-04-24 ENCOUNTER — Encounter: Payer: Self-pay | Admitting: Internal Medicine

## 2016-04-24 ENCOUNTER — Ambulatory Visit (INDEPENDENT_AMBULATORY_CARE_PROVIDER_SITE_OTHER): Payer: Medicare HMO | Admitting: Internal Medicine

## 2016-04-24 VITALS — BP 99/58 | HR 89 | Ht 66.0 in | Wt 154.0 lb

## 2016-04-24 DIAGNOSIS — I5022 Chronic systolic (congestive) heart failure: Secondary | ICD-10-CM | POA: Diagnosis not present

## 2016-04-24 DIAGNOSIS — I428 Other cardiomyopathies: Secondary | ICD-10-CM

## 2016-04-24 DIAGNOSIS — Z95810 Presence of automatic (implantable) cardiac defibrillator: Secondary | ICD-10-CM

## 2016-04-24 DIAGNOSIS — Z01812 Encounter for preprocedural laboratory examination: Secondary | ICD-10-CM

## 2016-04-24 NOTE — Patient Instructions (Signed)
Medication Instructions: - Your physician recommends that you continue on your current medications as directed. Please refer to the Current Medication list given to you today.  Labwork: - Your physician recommends that you return for lab work : Thursday 05/21/16- BMP/CBC/ INR  Procedures/Testing: - Your physician has recommended that you have a defibrillator generator (battery) change out.  ** Friday 05/29/16- arrive at 7:30 am ** Dr. Odessa Fleming nurse, Herbert Seta, will mail you detailed letter of instructions  Follow-Up: - Your physician recommends that you schedule a follow-up appointment in: about 14 days (from 05/29/16) for a wound check with the Device Clinic.   Any Additional Special Instructions Will Be Listed Below (If Applicable).     If you need a refill on your cardiac medications before your next appointment, please call your pharmacy.
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
## 2016-04-27 ENCOUNTER — Other Ambulatory Visit: Payer: Self-pay

## 2016-04-27 ENCOUNTER — Ambulatory Visit (HOSPITAL_COMMUNITY): Payer: Medicare HMO | Attending: Cardiology

## 2016-04-27 DIAGNOSIS — I350 Nonrheumatic aortic (valve) stenosis: Secondary | ICD-10-CM

## 2016-04-27 DIAGNOSIS — I351 Nonrheumatic aortic (valve) insufficiency: Secondary | ICD-10-CM | POA: Diagnosis not present

## 2016-04-27 DIAGNOSIS — I5022 Chronic systolic (congestive) heart failure: Secondary | ICD-10-CM | POA: Diagnosis not present

## 2016-04-30 ENCOUNTER — Encounter: Payer: Self-pay | Admitting: *Deleted

## 2016-05-07 LAB — CUP PACEART INCLINIC DEVICE CHECK
Brady Statistic AP VS Percent: 0.02 %
Brady Statistic AS VP Percent: 99.77 %
Brady Statistic AS VS Percent: 0.05 %
Brady Statistic RA Percent Paced: 0.17 %
Date Time Interrogation Session: 20180126184948
HIGH POWER IMPEDANCE MEASURED VALUE: 48 Ohm
HIGH POWER IMPEDANCE MEASURED VALUE: 494 Ohm
HIGH POWER IMPEDANCE MEASURED VALUE: 61 Ohm
Implantable Lead Implant Date: 20130410
Implantable Lead Implant Date: 20130410
Implantable Lead Implant Date: 20130724
Implantable Lead Location: 753858
Implantable Lead Location: 753859
Implantable Lead Location: 753862
Implantable Lead Model: 5071
Implantable Lead Model: 5076
Implantable Pulse Generator Implant Date: 20130410
Lead Channel Impedance Value: 4047 Ohm
Lead Channel Impedance Value: 4047 Ohm
Lead Channel Impedance Value: 456 Ohm
Lead Channel Impedance Value: 513 Ohm
Lead Channel Pacing Threshold Amplitude: 0.75 V
Lead Channel Pacing Threshold Amplitude: 1 V
Lead Channel Pacing Threshold Pulse Width: 0.4 ms
Lead Channel Pacing Threshold Pulse Width: 0.5 ms
Lead Channel Sensing Intrinsic Amplitude: 13.375 mV
Lead Channel Sensing Intrinsic Amplitude: 3.125 mV
Lead Channel Setting Pacing Amplitude: 1.5 V
Lead Channel Setting Pacing Pulse Width: 0.4 ms
MDC IDC LEAD IMPLANT DT: 20130410
MDC IDC LEAD IMPLANT DT: 20130410
MDC IDC LEAD LOCATION: 753858
MDC IDC LEAD LOCATION: 753860
MDC IDC MSMT BATTERY VOLTAGE: 2.62 V
MDC IDC MSMT LEADCHNL RA IMPEDANCE VALUE: 513 Ohm
MDC IDC MSMT LEADCHNL RV PACING THRESHOLD AMPLITUDE: 1 V
MDC IDC MSMT LEADCHNL RV PACING THRESHOLD PULSEWIDTH: 0.4 ms
MDC IDC SET LEADCHNL LV PACING AMPLITUDE: 2 V
MDC IDC SET LEADCHNL LV PACING PULSEWIDTH: 0.5 ms
MDC IDC SET LEADCHNL RV PACING AMPLITUDE: 2.5 V
MDC IDC SET LEADCHNL RV SENSING SENSITIVITY: 0.3 mV
MDC IDC STAT BRADY AP VP PERCENT: 0.15 %
MDC IDC STAT BRADY RV PERCENT PACED: 99.89 %

## 2016-05-21 ENCOUNTER — Other Ambulatory Visit: Payer: Medicare HMO | Admitting: *Deleted

## 2016-05-21 DIAGNOSIS — I5022 Chronic systolic (congestive) heart failure: Secondary | ICD-10-CM

## 2016-05-21 DIAGNOSIS — Z01812 Encounter for preprocedural laboratory examination: Secondary | ICD-10-CM

## 2016-05-21 DIAGNOSIS — I428 Other cardiomyopathies: Secondary | ICD-10-CM | POA: Diagnosis not present

## 2016-05-22 DIAGNOSIS — F606 Avoidant personality disorder: Secondary | ICD-10-CM | POA: Diagnosis not present

## 2016-05-22 DIAGNOSIS — G894 Chronic pain syndrome: Secondary | ICD-10-CM | POA: Diagnosis not present

## 2016-05-22 DIAGNOSIS — Z5181 Encounter for therapeutic drug level monitoring: Secondary | ICD-10-CM | POA: Diagnosis not present

## 2016-05-22 DIAGNOSIS — Z01818 Encounter for other preprocedural examination: Secondary | ICD-10-CM | POA: Diagnosis not present

## 2016-05-22 DIAGNOSIS — M2550 Pain in unspecified joint: Secondary | ICD-10-CM | POA: Diagnosis not present

## 2016-05-22 DIAGNOSIS — M791 Myalgia: Secondary | ICD-10-CM | POA: Diagnosis not present

## 2016-05-22 DIAGNOSIS — M5441 Lumbago with sciatica, right side: Secondary | ICD-10-CM | POA: Diagnosis not present

## 2016-05-22 DIAGNOSIS — M25511 Pain in right shoulder: Secondary | ICD-10-CM | POA: Diagnosis not present

## 2016-05-22 DIAGNOSIS — F331 Major depressive disorder, recurrent, moderate: Secondary | ICD-10-CM | POA: Diagnosis not present

## 2016-05-22 LAB — CBC WITH DIFFERENTIAL/PLATELET
BASOS ABS: 0.1 10*3/uL (ref 0.0–0.2)
Basos: 1 %
EOS (ABSOLUTE): 0.9 10*3/uL — AB (ref 0.0–0.4)
EOS: 8 %
Hematocrit: 40.5 % (ref 37.5–51.0)
Hemoglobin: 13.7 g/dL (ref 13.0–17.7)
IMMATURE GRANULOCYTES: 0 %
Immature Grans (Abs): 0 10*3/uL (ref 0.0–0.1)
LYMPHS ABS: 2.8 10*3/uL (ref 0.7–3.1)
Lymphs: 25 %
MCH: 30.7 pg (ref 26.6–33.0)
MCHC: 33.8 g/dL (ref 31.5–35.7)
MCV: 91 fL (ref 79–97)
MONOS ABS: 1.2 10*3/uL — AB (ref 0.1–0.9)
Monocytes: 10 %
NEUTROS PCT: 56 %
Neutrophils Absolute: 6.4 10*3/uL (ref 1.4–7.0)
PLATELETS: 263 10*3/uL (ref 150–379)
RBC: 4.46 x10E6/uL (ref 4.14–5.80)
RDW: 13.8 % (ref 12.3–15.4)
WBC: 11.5 10*3/uL — AB (ref 3.4–10.8)

## 2016-05-22 LAB — BASIC METABOLIC PANEL
BUN / CREAT RATIO: 10 (ref 10–24)
BUN: 15 mg/dL (ref 8–27)
CO2: 24 mmol/L (ref 18–29)
CREATININE: 1.55 mg/dL — AB (ref 0.76–1.27)
Calcium: 9.5 mg/dL (ref 8.6–10.2)
Chloride: 96 mmol/L (ref 96–106)
GFR calc Af Amer: 52 — ABNORMAL LOW (ref >59)
GFR calc non Af Amer: 45 — ABNORMAL LOW (ref >59)
GLUCOSE: 85 mg/dL (ref 65–99)
POTASSIUM: 4.1 mmol/L (ref 3.5–5.2)
SODIUM: 136 mmol/L (ref 134–144)

## 2016-05-22 LAB — PROTIME-INR
INR: 1 (ref 0.8–1.2)
PROTHROMBIN TIME: 11.1 s (ref 9.1–12.0)

## 2016-05-29 ENCOUNTER — Ambulatory Visit (HOSPITAL_COMMUNITY)
Admission: RE | Admit: 2016-05-29 | Discharge: 2016-05-29 | Disposition: A | Discharge status: Home / Self Care | Payer: Medicare HMO | Source: Ambulatory Visit | Attending: Internal Medicine | Admitting: Internal Medicine

## 2016-05-29 ENCOUNTER — Encounter (HOSPITAL_COMMUNITY): Payer: Self-pay | Admitting: Internal Medicine

## 2016-05-29 ENCOUNTER — Encounter (HOSPITAL_COMMUNITY)
Admission: RE | Disposition: A | Discharge status: Home / Self Care | Payer: Self-pay | Source: Ambulatory Visit | Attending: Internal Medicine

## 2016-05-29 DIAGNOSIS — K449 Diaphragmatic hernia without obstruction or gangrene: Secondary | ICD-10-CM | POA: Insufficient documentation

## 2016-05-29 DIAGNOSIS — I2510 Atherosclerotic heart disease of native coronary artery without angina pectoris: Secondary | ICD-10-CM | POA: Insufficient documentation

## 2016-05-29 DIAGNOSIS — I5030 Unspecified diastolic (congestive) heart failure: Secondary | ICD-10-CM | POA: Insufficient documentation

## 2016-05-29 DIAGNOSIS — I340 Nonrheumatic mitral (valve) insufficiency: Secondary | ICD-10-CM | POA: Insufficient documentation

## 2016-05-29 DIAGNOSIS — Z95810 Presence of automatic (implantable) cardiac defibrillator: Secondary | ICD-10-CM | POA: Diagnosis present

## 2016-05-29 DIAGNOSIS — Z7982 Long term (current) use of aspirin: Secondary | ICD-10-CM | POA: Diagnosis not present

## 2016-05-29 DIAGNOSIS — Z953 Presence of xenogenic heart valve: Secondary | ICD-10-CM | POA: Diagnosis not present

## 2016-05-29 DIAGNOSIS — M549 Dorsalgia, unspecified: Secondary | ICD-10-CM | POA: Diagnosis not present

## 2016-05-29 DIAGNOSIS — M5030 Other cervical disc degeneration, unspecified cervical region: Secondary | ICD-10-CM | POA: Diagnosis not present

## 2016-05-29 DIAGNOSIS — K219 Gastro-esophageal reflux disease without esophagitis: Secondary | ICD-10-CM | POA: Insufficient documentation

## 2016-05-29 DIAGNOSIS — I447 Left bundle-branch block, unspecified: Secondary | ICD-10-CM | POA: Diagnosis not present

## 2016-05-29 DIAGNOSIS — E559 Vitamin D deficiency, unspecified: Secondary | ICD-10-CM | POA: Insufficient documentation

## 2016-05-29 DIAGNOSIS — I428 Other cardiomyopathies: Secondary | ICD-10-CM

## 2016-05-29 DIAGNOSIS — F329 Major depressive disorder, single episode, unspecified: Secondary | ICD-10-CM | POA: Insufficient documentation

## 2016-05-29 DIAGNOSIS — N183 Chronic kidney disease, stage 3 (moderate): Secondary | ICD-10-CM | POA: Diagnosis not present

## 2016-05-29 DIAGNOSIS — F4310 Post-traumatic stress disorder, unspecified: Secondary | ICD-10-CM | POA: Insufficient documentation

## 2016-05-29 DIAGNOSIS — I739 Peripheral vascular disease, unspecified: Secondary | ICD-10-CM | POA: Insufficient documentation

## 2016-05-29 DIAGNOSIS — Z8711 Personal history of peptic ulcer disease: Secondary | ICD-10-CM | POA: Insufficient documentation

## 2016-05-29 DIAGNOSIS — I5022 Chronic systolic (congestive) heart failure: Secondary | ICD-10-CM | POA: Diagnosis present

## 2016-05-29 DIAGNOSIS — J449 Chronic obstructive pulmonary disease, unspecified: Secondary | ICD-10-CM | POA: Insufficient documentation

## 2016-05-29 DIAGNOSIS — Z880 Allergy status to penicillin: Secondary | ICD-10-CM | POA: Diagnosis not present

## 2016-05-29 DIAGNOSIS — I252 Old myocardial infarction: Secondary | ICD-10-CM | POA: Diagnosis not present

## 2016-05-29 DIAGNOSIS — F17210 Nicotine dependence, cigarettes, uncomplicated: Secondary | ICD-10-CM | POA: Insufficient documentation

## 2016-05-29 DIAGNOSIS — Z4502 Encounter for adjustment and management of automatic implantable cardiac defibrillator: Secondary | ICD-10-CM | POA: Insufficient documentation

## 2016-05-29 DIAGNOSIS — G8929 Other chronic pain: Secondary | ICD-10-CM | POA: Diagnosis not present

## 2016-05-29 DIAGNOSIS — I714 Abdominal aortic aneurysm, without rupture: Secondary | ICD-10-CM | POA: Diagnosis not present

## 2016-05-29 HISTORY — PX: BIV ICD GENERATOR CHANGEOUT: EP1194

## 2016-05-29 HISTORY — PX: BIV PACEMAKER GENERATOR CHANGEOUT: EP1198

## 2016-05-29 LAB — SURGICAL PCR SCREEN
MRSA, PCR: NEGATIVE
STAPHYLOCOCCUS AUREUS: NEGATIVE

## 2016-05-29 SURGERY — BIV PACEMAKER GENERATOR CHANGEOUT

## 2016-05-29 MED ORDER — CHLORHEXIDINE GLUCONATE 4 % EX LIQD
60.0000 mL | Freq: Once | CUTANEOUS | Status: DC
  Filled 2016-05-29: qty 60

## 2016-05-29 MED ORDER — CEFAZOLIN SODIUM-DEXTROSE 2-4 GM/100ML-% IV SOLN
INTRAVENOUS | Status: AC
  Filled 2016-05-29: qty 100

## 2016-05-29 MED ORDER — SODIUM CHLORIDE 0.9 % IR SOLN
Status: AC
  Filled 2016-05-29: qty 2

## 2016-05-29 MED ORDER — LIDOCAINE HCL (PF) 1 % IJ SOLN
INTRAMUSCULAR | Status: AC
  Filled 2016-05-29: qty 60

## 2016-05-29 MED ORDER — SODIUM CHLORIDE 0.9 % IV SOLN: INTRAVENOUS | Status: AC

## 2016-05-29 MED ORDER — VANCOMYCIN HCL IN DEXTROSE 1-5 GM/200ML-% IV SOLN
1000.0000 mg | INTRAVENOUS | Status: AC
  Administered 2016-05-29: 1000 mg via INTRAVENOUS

## 2016-05-29 MED ORDER — ACETAMINOPHEN 325 MG PO TABS: 325.0000 mg | ORAL_TABLET | ORAL | Status: DC | PRN

## 2016-05-29 MED ORDER — LIDOCAINE HCL (PF) 1 % IJ SOLN
INTRAMUSCULAR | Status: DC | PRN
  Administered 2016-05-29: 45 mL

## 2016-05-29 MED ORDER — MIDAZOLAM HCL 5 MG/5ML IJ SOLN
INTRAMUSCULAR | Status: AC
  Filled 2016-05-29: qty 5

## 2016-05-29 MED ORDER — SODIUM CHLORIDE 0.9 % IR SOLN
80.0000 mg | Status: AC
  Administered 2016-05-29: 80 mg

## 2016-05-29 MED ORDER — MUPIROCIN 2 % EX OINT
TOPICAL_OINTMENT | CUTANEOUS | Status: AC
  Administered 2016-05-29: 1
  Filled 2016-05-29: qty 22

## 2016-05-29 MED ORDER — SODIUM CHLORIDE 0.9 % IV SOLN
INTRAVENOUS | Status: DC
  Administered 2016-05-29: 09:00:00 via INTRAVENOUS

## 2016-05-29 MED ORDER — FENTANYL CITRATE (PF) 100 MCG/2ML IJ SOLN
INTRAMUSCULAR | Status: AC
  Filled 2016-05-29: qty 2

## 2016-05-29 MED ORDER — VANCOMYCIN HCL IN DEXTROSE 1-5 GM/200ML-% IV SOLN
INTRAVENOUS | Status: AC
  Filled 2016-05-29: qty 200

## 2016-05-29 MED ORDER — ONDANSETRON HCL 4 MG/2ML IJ SOLN: 4.0000 mg | Freq: Four times a day (QID) | INTRAMUSCULAR | Status: DC | PRN

## 2016-05-29 SURGICAL SUPPLY — 6 items
CABLE SURGICAL S-101-97-12 (CABLE) ×2 IMPLANT
HEMOSTAT SURGICEL 2X4 FIBR (HEMOSTASIS) ×2 IMPLANT
PACEMAKER PRCT MRI CRTP W1TR01 (Pacemaker) IMPLANT
PAD DEFIB LIFELINK (PAD) ×2 IMPLANT
PPM PRECEPTA MRI CRT-P W1TR01 (Pacemaker) ×3 IMPLANT
TRAY PACEMAKER INSERTION (CUSTOM PROCEDURE TRAY) ×2 IMPLANT
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
## 2016-05-29 NOTE — Discharge Instructions (Signed)
Pacemaker Battery Change °A pacemaker battery usually lasts 5-15 years (6-7 years on average). A few times a year, you will be asked to visit your health care provider to have a full evaluation of your pacemaker. When the battery is low, your pacemaker battery and generator will be completely replaced. Most often, this procedure is simpler than the first surgery because the wires (leads) that connect the generator to the heart are already in place. °There are many things that affect how long a pacemaker battery will last, including: °· The age of the pacemaker. °· The number of leads you have(1, 2, or 3). °· The pacemaker workload. If the pacemaker is helping the heart more often, the battery will not last as long. °· Power (voltage) settings. ° °Tell a health care provider about: °· Any allergies you have. °· All medicines you are taking, including vitamins, herbs, eye drops, creams, and over-the-counter medicines. °· Any problems you or family members have had with anesthetic medicines. °· Any blood disorders you have. °· Any surgeries you have had, especially the surgeries you have had since your last pacemaker was placed. °· Any medical conditions you have. °· Whether you are pregnant or may be pregnant. °· Any symptoms of heart problems, such as chest pain, trouble breathing, palpitations, light-headedness, or feelings of an abnormal or irregular heartbeat. °· Smoking habits. This can affect your reaction to anesthesia. °What are the risks? °Generally, this is a safe procedure. However, problems may occur, including: °· Bleeding. °· Bruising of the skin around where the surgical cut (incision) was made. °· Pulling apart of the skin at the incision site. °· Infection. °· Nerve damage. °· Injury to other organs, such as the lungs. °· Allergic reaction to anesthetics or other medicines used during the procedure. °· People with diabetes may have a temporary increase in blood sugar (glucose) after any surgical  procedure. ° °What happens before the procedure? °Staying hydrated °Follow instructions from your health care provider about hydration, which may include: °· Up to 2 hours before the procedure - you may continue to drink clear liquids, such as water, clear fruit juice, black coffee, and plain tea. ° °Eating and drinking restrictions °Follow instructions from your health care provider about eating and drinking restrictions, which may include: °· 8 hours before the procedure - stop eating heavy meals or foods such as meat, fried foods, or fatty foods. °· 6 hours before the procedure - stop eating light meals or foods, such as toast or cereal. °· 6 hours before the procedure - stop drinking milk or drinks that contain milk. °· 2 hours before the procedure - stop drinking clear liquids. ° °General instructions °· Ask your health care provider about: °? Changing or stopping your regular medicines. This is especially important if you are taking diabetes medicines or blood thinners. °? Taking medicines such as aspirin and ibuprofen. These medicines can thin your blood. Do not take these medicines before your procedure if your health care provider instructs you not to. °? Taking a sip of water with any approved medicines on the morning of the procedure. °· Plan to have someone take you home after the procedure. °What happens during the procedure? °· To reduce your risk of infection: °? Your health care team will wash or sanitize their hands. °? The skin around the area of the chest will be washed with soap. °? Hair may be removed from the surgical area. °· An IV tube will be inserted into one your   veins to give you medicine and fluids. °· You will be given one or more of the following: °? A medicine to help you relax (sedative). °? A medicine to numb the area where the pacemaker is located (local anesthetic). °· You may be given antibiotic medicine to prevent infection. °· Your health care provider will make an incision to  reopen the pocket holding the pacemaker. °· The old pacemaker will be disconnected from the leads. °· The leads will be tested. °· If needed, the leads will be replaced. If the leads are functioning properly, the new pacemaker will be connected to the existing leads. °· A heart monitor and the pacemaker programmer will be used to make sure that the newly implanted pacemaker is working properly. °· The incision site will be closed. A bandage (dressing) will be placed over the pacemaker site. °The procedure may vary among health care providers and hospitals. °What happens after the procedure? °· Your blood pressure, heart rate, breathing rate, and blood oxygen level will be monitored until your health care team is satisfied that your pacemaker is working properly. °· Your health care provider will tell you when your pacemaker will need to be tested again, or when to return to the office for removal of dressing and stitches. °· Do not drive for 24 hours if you were given a sedative. °· The dressing will be removed 24-48 hours after the procedure, or as told by your health care provider. °Summary °· A pacemaker battery usually lasts 5-15 years (6-7 years on average). °· When the battery is low, your pacemaker battery and generator will be completely replaced. °· Risks of this procedure include bleeding, bruising, infection, damage to other structures, pulling apart of the skin at the incision site, and allergic reactions to medicines or anesthetics. °· Most often, this procedure is simpler than the first surgery because the wires (leads) that connect the generator to the heart are already in place. °This information is not intended to replace advice given to you by your health care provider. Make sure you discuss any questions you have with your health care provider. °Document Released: 06/24/2006 Document Revised: 02/18/2016 Document Reviewed: 02/18/2016 °Elsevier Interactive Patient Education © 2017 [employer] ° °

## 2016-06-01 MED FILL — Midazolam HCl Inj 5 MG/5ML (Base Equivalent): INTRAMUSCULAR | Qty: 5 | Status: AC

## 2016-06-01 MED FILL — Fentanyl Citrate Preservative Free (PF) Inj 100 MCG/2ML: INTRAMUSCULAR | Qty: 2 | Status: AC

## 2016-06-02 ENCOUNTER — Telehealth: Payer: Self-pay | Admitting: Physician Assistant

## 2016-06-02 ENCOUNTER — Telehealth: Payer: Self-pay | Admitting: Internal Medicine

## 2016-06-02 ENCOUNTER — Ambulatory Visit (INDEPENDENT_AMBULATORY_CARE_PROVIDER_SITE_OTHER): Payer: Medicare HMO | Admitting: *Deleted

## 2016-06-02 DIAGNOSIS — Z95810 Presence of automatic (implantable) cardiac defibrillator: Secondary | ICD-10-CM

## 2016-06-02 NOTE — Patient Instructions (Signed)
Pt seen d/t c/of ICD site swelling. Slight swelling noted, soft to palpation. Healing stages of bruising noted. Dermabond intact at incision site. Pt denied fever or chills. ROV w/ 3/15 for scheduled wound check and device interrogation.

## 2016-06-02 NOTE — Telephone Encounter (Signed)
Paged by answering service, this morning around 3-4am patient woke up with pain at pacemaker site. Recent battery change. Also noted puffiness and fluid inside. Mild pain. No redness or fever. Advice to take Tylenol. Patient will  call office after 8am to make appointment for wound check.

## 2016-06-02 NOTE — Telephone Encounter (Signed)
New Message    Per wife the incision cite is swollen and red, clear up under pts arm .   Pt just had new battery put in pacemaker with Dr Graciela Husbands

## 2016-06-02 NOTE — Telephone Encounter (Signed)
Spoke with pts wife, pt agreeable to apt today at 2:00pm with device clinic to evaluate ICD site.

## 2016-06-11 ENCOUNTER — Ambulatory Visit: Payer: Medicare HMO

## 2016-06-23 ENCOUNTER — Telehealth: Payer: Self-pay | Admitting: Cardiology

## 2016-06-23 NOTE — Telephone Encounter (Signed)
Pt's wife would like a call back to conf. The transmission was received please.

## 2016-06-23 NOTE — Telephone Encounter (Signed)
Spoke with [employer], stated that pts new device had not been registered and that is why pts remote transmission was not coming through. Spoke with [employer], new device serial number given, they informed that the new device would be registered, but would take 24 hours before a transmission could be available for review.

## 2016-06-23 NOTE — Telephone Encounter (Signed)
Informed pts wife that it could take up to 24 hours before the remote transmission would come through, she voiced understanding.

## 2016-06-23 NOTE — Telephone Encounter (Signed)
Spoke w/ pt wife and informed her that there is a Scientist, product/process development difficulty on our end and Armed forces logistics/support/administrative officer is currently working w/ [employer] to get the issued w/ resolved. Pt wife verbalized understanding.

## 2016-06-23 NOTE — Telephone Encounter (Signed)
Spoke w/ pt and he informed me that he could not come to his wound check appt that was scheduled for 06-24-16 b/c his back had gone out. He can not walk and he does not want to call 911 to bring him to his appointment b/c his insurance probably wouldn't pay for that. Instructed pt to send a remote transmission w/ his home monitor, and rescheduled his wound check for 07-01-16. Pt agreed to this plan.

## 2016-06-24 ENCOUNTER — Ambulatory Visit: Payer: Medicare HMO

## 2016-06-29 NOTE — Telephone Encounter (Signed)
Transmission received and normal.  

## 2016-07-01 ENCOUNTER — Ambulatory Visit (INDEPENDENT_AMBULATORY_CARE_PROVIDER_SITE_OTHER): Payer: Medicare HMO | Admitting: *Deleted

## 2016-07-01 DIAGNOSIS — I428 Other cardiomyopathies: Secondary | ICD-10-CM | POA: Diagnosis not present

## 2016-07-01 LAB — CUP PACEART INCLINIC DEVICE CHECK
Battery Voltage: 3.18 V
Brady Statistic AP VP Percent: 0.02 %
Brady Statistic AS VP Percent: 99.93 %
Brady Statistic RA Percent Paced: 0.02 %
Brady Statistic RV Percent Paced: 99.95 %
Date Time Interrogation Session: 20180404165347
Implantable Lead Implant Date: 20130410
Implantable Lead Implant Date: 20130724
Implantable Lead Location: 753858
Implantable Lead Model: 5071
Implantable Pulse Generator Implant Date: 20180302
Lead Channel Impedance Value: 3420 Ohm
Lead Channel Impedance Value: 3420 Ohm
Lead Channel Impedance Value: 380 Ohm
Lead Channel Impedance Value: 570 Ohm
Lead Channel Pacing Threshold Amplitude: 1 V
Lead Channel Pacing Threshold Pulse Width: 0.4 ms
Lead Channel Sensing Intrinsic Amplitude: 3.25 mV
Lead Channel Setting Pacing Amplitude: 1.75 V
Lead Channel Setting Sensing Sensitivity: 2 mV
MDC IDC LEAD IMPLANT DT: 20130410
MDC IDC LEAD IMPLANT DT: 20130410
MDC IDC LEAD LOCATION: 753858
MDC IDC LEAD LOCATION: 753859
MDC IDC LEAD LOCATION: 753860
MDC IDC MSMT BATTERY REMAINING LONGEVITY: 123 mo
MDC IDC MSMT LEADCHNL LV IMPEDANCE VALUE: 3420 Ohm
MDC IDC MSMT LEADCHNL LV IMPEDANCE VALUE: 475 Ohm
MDC IDC MSMT LEADCHNL LV IMPEDANCE VALUE: 570 Ohm
MDC IDC MSMT LEADCHNL RA PACING THRESHOLD AMPLITUDE: 0.875 V
MDC IDC MSMT LEADCHNL RA PACING THRESHOLD PULSEWIDTH: 0.4 ms
MDC IDC MSMT LEADCHNL RA SENSING INTR AMPL: 3.625 mV
MDC IDC MSMT LEADCHNL RV IMPEDANCE VALUE: 551 Ohm
MDC IDC MSMT LEADCHNL RV IMPEDANCE VALUE: 570 Ohm
MDC IDC MSMT LEADCHNL RV SENSING INTR AMPL: 10.75 mV
MDC IDC SET LEADCHNL LV PACING AMPLITUDE: 2 V
MDC IDC SET LEADCHNL LV PACING PULSEWIDTH: 0.5 ms
MDC IDC SET LEADCHNL RV PACING AMPLITUDE: 2.5 V
MDC IDC SET LEADCHNL RV PACING PULSEWIDTH: 0.4 ms
MDC IDC STAT BRADY AP VS PERCENT: 0 %
MDC IDC STAT BRADY AS VS PERCENT: 0.05 %

## 2016-07-01 NOTE — Progress Notes (Signed)
Wound check appointment. Steri-strips removed by patient at home. Wound without redness or edema. Incision edges approximated, wound well healed.CRT-P device check in clinic. Normal device function. Thresholds, sensing, impedance consistent with previous measurements. Histograms appropriate for patient and level of activity. No mode switches or ventricular high rate episodes. Patient bi-ventricularly pacing 99.9% of the time. Device programmed with appropriate safety margins. Device heart failure diagnostics are within normal limits and stable over time. Estimated longevity 10.2. ROV with SK 7/10.

## 2016-08-28 ENCOUNTER — Telehealth (HOSPITAL_COMMUNITY): Payer: Self-pay | Admitting: *Deleted

## 2016-08-28 ENCOUNTER — Telehealth (HOSPITAL_COMMUNITY): Payer: Self-pay | Admitting: Vascular Surgery

## 2016-08-28 DIAGNOSIS — I10 Essential (primary) hypertension: Secondary | ICD-10-CM | POA: Diagnosis not present

## 2016-08-28 DIAGNOSIS — I714 Abdominal aortic aneurysm, without rupture, unspecified: Secondary | ICD-10-CM

## 2016-08-28 DIAGNOSIS — M791 Myalgia: Secondary | ICD-10-CM | POA: Diagnosis not present

## 2016-08-28 DIAGNOSIS — K0510 Chronic gingivitis, plaque induced: Secondary | ICD-10-CM | POA: Diagnosis not present

## 2016-08-28 DIAGNOSIS — K219 Gastro-esophageal reflux disease without esophagitis: Secondary | ICD-10-CM | POA: Diagnosis not present

## 2016-08-28 DIAGNOSIS — N183 Chronic kidney disease, stage 3 (moderate): Secondary | ICD-10-CM | POA: Diagnosis not present

## 2016-08-28 DIAGNOSIS — E784 Other hyperlipidemia: Secondary | ICD-10-CM | POA: Diagnosis not present

## 2016-08-28 DIAGNOSIS — K5909 Other constipation: Secondary | ICD-10-CM | POA: Diagnosis not present

## 2016-08-28 DIAGNOSIS — F17210 Nicotine dependence, cigarettes, uncomplicated: Secondary | ICD-10-CM | POA: Diagnosis not present

## 2016-08-28 DIAGNOSIS — S40862A Insect bite (nonvenomous) of left upper arm, initial encounter: Secondary | ICD-10-CM | POA: Diagnosis not present

## 2016-08-28 DIAGNOSIS — I6523 Occlusion and stenosis of bilateral carotid arteries: Secondary | ICD-10-CM

## 2016-08-28 DIAGNOSIS — M2550 Pain in unspecified joint: Secondary | ICD-10-CM | POA: Diagnosis not present

## 2016-08-28 DIAGNOSIS — N401 Enlarged prostate with lower urinary tract symptoms: Secondary | ICD-10-CM | POA: Diagnosis not present

## 2016-08-28 DIAGNOSIS — I5022 Chronic systolic (congestive) heart failure: Secondary | ICD-10-CM | POA: Diagnosis not present

## 2016-08-28 DIAGNOSIS — M1990 Unspecified osteoarthritis, unspecified site: Secondary | ICD-10-CM | POA: Diagnosis not present

## 2016-08-28 NOTE — Telephone Encounter (Signed)
Pt will be due for his yearly carotid u/s and AAA duplex in July, along with f/u appt w/Dr Bensimhon.  Orders placed, will send to schedulers to arrange

## 2016-08-28 NOTE — Telephone Encounter (Signed)
Left pt message to make appt w/ db late July early Aug

## 2016-09-18 ENCOUNTER — Encounter: Payer: Self-pay | Admitting: Internal Medicine

## 2016-10-01 ENCOUNTER — Other Ambulatory Visit (HOSPITAL_COMMUNITY): Payer: Self-pay | Admitting: Cardiology

## 2016-10-01 DIAGNOSIS — I5022 Chronic systolic (congestive) heart failure: Secondary | ICD-10-CM

## 2016-10-01 MED ORDER — CARVEDILOL 12.5 MG PO TABS: 12.5000 mg | ORAL_TABLET | Freq: Two times a day (BID) | ORAL | 1 refills | Status: AC

## 2016-10-06 ENCOUNTER — Encounter (INDEPENDENT_AMBULATORY_CARE_PROVIDER_SITE_OTHER): Payer: Medicare HMO | Admitting: Internal Medicine

## 2016-10-06 ENCOUNTER — Encounter (INDEPENDENT_AMBULATORY_CARE_PROVIDER_SITE_OTHER): Payer: Self-pay

## 2016-10-20 ENCOUNTER — Ambulatory Visit (HOSPITAL_COMMUNITY)
Admission: RE | Admit: 2016-10-20 | Discharge: 2016-10-20 | Disposition: A | Discharge status: Home / Self Care | Payer: Medicare HMO | Source: Ambulatory Visit | Attending: Internal Medicine | Admitting: Internal Medicine

## 2016-10-20 DIAGNOSIS — I714 Abdominal aortic aneurysm, without rupture, unspecified: Secondary | ICD-10-CM

## 2016-10-20 DIAGNOSIS — I708 Atherosclerosis of other arteries: Secondary | ICD-10-CM | POA: Insufficient documentation

## 2016-10-20 DIAGNOSIS — I6523 Occlusion and stenosis of bilateral carotid arteries: Secondary | ICD-10-CM | POA: Insufficient documentation

## 2016-10-20 DIAGNOSIS — I700 Atherosclerosis of aorta: Secondary | ICD-10-CM | POA: Diagnosis not present

## 2016-11-16 ENCOUNTER — Encounter (HOSPITAL_COMMUNITY): Payer: Medicare HMO | Admitting: Cardiology

## 2016-12-09 ENCOUNTER — Ambulatory Visit (HOSPITAL_COMMUNITY)
Admission: RE | Admit: 2016-12-09 | Discharge: 2016-12-09 | Disposition: A | Discharge status: Home / Self Care | Payer: Medicare HMO | Source: Ambulatory Visit | Attending: Cardiology | Admitting: Cardiology

## 2016-12-09 ENCOUNTER — Encounter (HOSPITAL_COMMUNITY): Payer: Self-pay | Admitting: Cardiology

## 2016-12-09 VITALS — BP 108/46 | HR 60 | Wt 155.8 lb

## 2016-12-09 DIAGNOSIS — Z79899 Other long term (current) drug therapy: Secondary | ICD-10-CM | POA: Diagnosis not present

## 2016-12-09 DIAGNOSIS — Z7982 Long term (current) use of aspirin: Secondary | ICD-10-CM | POA: Insufficient documentation

## 2016-12-09 DIAGNOSIS — I700 Atherosclerosis of aorta: Secondary | ICD-10-CM | POA: Diagnosis not present

## 2016-12-09 DIAGNOSIS — J449 Chronic obstructive pulmonary disease, unspecified: Secondary | ICD-10-CM | POA: Diagnosis not present

## 2016-12-09 DIAGNOSIS — F4310 Post-traumatic stress disorder, unspecified: Secondary | ICD-10-CM | POA: Diagnosis not present

## 2016-12-09 DIAGNOSIS — Z8711 Personal history of peptic ulcer disease: Secondary | ICD-10-CM | POA: Diagnosis not present

## 2016-12-09 DIAGNOSIS — F17210 Nicotine dependence, cigarettes, uncomplicated: Secondary | ICD-10-CM | POA: Diagnosis not present

## 2016-12-09 DIAGNOSIS — Z95810 Presence of automatic (implantable) cardiac defibrillator: Secondary | ICD-10-CM | POA: Insufficient documentation

## 2016-12-09 DIAGNOSIS — R05 Cough: Secondary | ICD-10-CM | POA: Diagnosis not present

## 2016-12-09 DIAGNOSIS — I428 Other cardiomyopathies: Secondary | ICD-10-CM

## 2016-12-09 DIAGNOSIS — E785 Hyperlipidemia, unspecified: Secondary | ICD-10-CM | POA: Diagnosis not present

## 2016-12-09 DIAGNOSIS — N183 Chronic kidney disease, stage 3 (moderate): Secondary | ICD-10-CM | POA: Diagnosis not present

## 2016-12-09 DIAGNOSIS — I2510 Atherosclerotic heart disease of native coronary artery without angina pectoris: Secondary | ICD-10-CM | POA: Diagnosis not present

## 2016-12-09 DIAGNOSIS — I5022 Chronic systolic (congestive) heart failure: Secondary | ICD-10-CM | POA: Insufficient documentation

## 2016-12-09 DIAGNOSIS — F329 Major depressive disorder, single episode, unspecified: Secondary | ICD-10-CM | POA: Diagnosis not present

## 2016-12-09 DIAGNOSIS — I714 Abdominal aortic aneurysm, without rupture: Secondary | ICD-10-CM | POA: Insufficient documentation

## 2016-12-09 DIAGNOSIS — I420 Dilated cardiomyopathy: Secondary | ICD-10-CM | POA: Diagnosis not present

## 2016-12-09 DIAGNOSIS — Z953 Presence of xenogenic heart valve: Secondary | ICD-10-CM | POA: Diagnosis not present

## 2016-12-09 DIAGNOSIS — K449 Diaphragmatic hernia without obstruction or gangrene: Secondary | ICD-10-CM | POA: Insufficient documentation

## 2016-12-09 DIAGNOSIS — K219 Gastro-esophageal reflux disease without esophagitis: Secondary | ICD-10-CM | POA: Diagnosis not present

## 2016-12-09 DIAGNOSIS — I350 Nonrheumatic aortic (valve) stenosis: Secondary | ICD-10-CM | POA: Insufficient documentation

## 2016-12-09 DIAGNOSIS — I739 Peripheral vascular disease, unspecified: Secondary | ICD-10-CM | POA: Diagnosis not present

## 2016-12-09 DIAGNOSIS — J984 Other disorders of lung: Secondary | ICD-10-CM | POA: Diagnosis not present

## 2016-12-09 DIAGNOSIS — I6529 Occlusion and stenosis of unspecified carotid artery: Secondary | ICD-10-CM | POA: Insufficient documentation

## 2016-12-09 DIAGNOSIS — R059 Cough, unspecified: Secondary | ICD-10-CM

## 2016-12-09 LAB — BASIC METABOLIC PANEL
ANION GAP: 6 (ref 5–15)
BUN: 6 mg/dL (ref 6–20)
CALCIUM: 9.2 mg/dL (ref 8.9–10.3)
CO2: 27 mmol/L (ref 22–32)
CREATININE: 1.4 mg/dL — AB (ref 0.61–1.24)
Chloride: 106 mmol/L (ref 101–111)
GFR, EST AFRICAN AMERICAN: 57 mL/min — AB (ref >60)
GFR, EST NON AFRICAN AMERICAN: 49 mL/min — AB (ref >60)
Glucose, Bld: 76 mg/dL (ref 65–99)
Potassium: 2.9 mmol/L — ABNORMAL LOW (ref 3.5–5.1)
Sodium: 139 mmol/L (ref 135–145)

## 2016-12-09 MED ORDER — DOXYCYCLINE HYCLATE 100 MG PO CAPS: 100.0000 mg | ORAL_CAPSULE | Freq: Two times a day (BID) | ORAL | 0 refills | Status: AC

## 2016-12-09 NOTE — Patient Instructions (Signed)
START Doxycycline 100 mg, one tab twice a day for 7 days  Labs today We will only contact you if something comes back abnormal or we need to make some changes. Otherwise no news is good news!   A chest x-ray takes a picture of the organs and structures inside the chest, including the heart, lungs, and blood vessels. This test can show several things, including, whether the heart is enlarges; whether fluid is building up in the lungs; and whether pacemaker / defibrillator leads are still in place.  Your physician recommends that you schedule a follow-up appointment in: 6 months with Dr Shirlee Latch and echo  Your physician has requested that you have an echocardiogram. Echocardiography is a painless test that uses sound waves to create images of your heart. It provides your doctor with information about the size and shape of your heart and how well your heart's chambers and valves are working. This procedure takes approximately one hour. There are no restrictions for this procedure.

## 2016-12-10 ENCOUNTER — Telehealth (HOSPITAL_COMMUNITY): Payer: Self-pay | Admitting: *Deleted

## 2016-12-10 DIAGNOSIS — I5022 Chronic systolic (congestive) heart failure: Secondary | ICD-10-CM

## 2016-12-10 MED ORDER — POTASSIUM CHLORIDE CRYS ER 20 MEQ PO TBCR: 40.0000 meq | EXTENDED_RELEASE_TABLET | Freq: Every day | ORAL | 3 refills | Status: AC

## 2016-12-10 NOTE — Addendum Note (Signed)
Addended by: Modesta Messing on: 12/10/2016 02:02 PM   Modules accepted: Orders
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
## 2016-12-10 NOTE — Telephone Encounter (Signed)
-----   Message from Laurey Morale, MD sent at 12/09/2016  4:11 PM EDT ----- Start KCl 40 daily and repeat BMET in 1 week.

## 2016-12-17 ENCOUNTER — Inpatient Hospital Stay (HOSPITAL_COMMUNITY): Admission: RE | Admit: 2016-12-17 | Payer: Medicare HMO | Source: Ambulatory Visit

## 2016-12-21 DIAGNOSIS — R7989 Other specified abnormal findings of blood chemistry: Secondary | ICD-10-CM | POA: Diagnosis not present

## 2016-12-21 DIAGNOSIS — N183 Chronic kidney disease, stage 3 (moderate): Secondary | ICD-10-CM | POA: Diagnosis not present

## 2016-12-21 DIAGNOSIS — I10 Essential (primary) hypertension: Secondary | ICD-10-CM | POA: Diagnosis not present

## 2016-12-21 DIAGNOSIS — E784 Other hyperlipidemia: Secondary | ICD-10-CM | POA: Diagnosis not present

## 2016-12-31 DIAGNOSIS — I10 Essential (primary) hypertension: Secondary | ICD-10-CM | POA: Diagnosis not present

## 2016-12-31 DIAGNOSIS — J418 Mixed simple and mucopurulent chronic bronchitis: Secondary | ICD-10-CM | POA: Diagnosis not present

## 2016-12-31 DIAGNOSIS — Z23 Encounter for immunization: Secondary | ICD-10-CM | POA: Diagnosis not present

## 2016-12-31 DIAGNOSIS — I5022 Chronic systolic (congestive) heart failure: Secondary | ICD-10-CM | POA: Diagnosis not present

## 2016-12-31 DIAGNOSIS — Z1159 Encounter for screening for other viral diseases: Secondary | ICD-10-CM | POA: Diagnosis not present

## 2016-12-31 DIAGNOSIS — G894 Chronic pain syndrome: Secondary | ICD-10-CM | POA: Diagnosis not present

## 2016-12-31 DIAGNOSIS — E7849 Other hyperlipidemia: Secondary | ICD-10-CM | POA: Diagnosis not present

## 2016-12-31 DIAGNOSIS — E876 Hypokalemia: Secondary | ICD-10-CM | POA: Diagnosis not present

## 2016-12-31 DIAGNOSIS — F332 Major depressive disorder, recurrent severe without psychotic features: Secondary | ICD-10-CM | POA: Diagnosis not present

## 2017-01-01 ENCOUNTER — Other Ambulatory Visit: Payer: Self-pay | Admitting: Internal Medicine

## 2017-05-21 ENCOUNTER — Encounter: Payer: Medicare HMO | Admitting: *Deleted

## 2017-05-21 ENCOUNTER — Telehealth: Payer: Self-pay | Admitting: Cardiology

## 2017-05-21 NOTE — Telephone Encounter (Signed)
Attempted to confirm remote transmission with pt. No answer and was unable to leave a message. Automated message stating that the call could not be completed as dialed.  

## 2017-05-24 ENCOUNTER — Encounter: Payer: Self-pay | Admitting: Cardiology

## 2017-09-21 ENCOUNTER — Encounter: Payer: Self-pay | Admitting: Cardiology

## 2017-10-22 ENCOUNTER — Encounter: Payer: Self-pay | Admitting: Cardiology

## 2018-01-14 ENCOUNTER — Encounter: Payer: Self-pay | Admitting: Cardiology

## 2018-07-05 ENCOUNTER — Other Ambulatory Visit: Payer: Self-pay

## 2018-07-05 ENCOUNTER — Encounter: Payer: Medicare HMO | Admitting: *Deleted

## 2021-03-13 ENCOUNTER — Telehealth: Payer: Self-pay

## 2021-03-13 NOTE — Telephone Encounter (Signed)
The patient daughter called back stating the patient was seen once by [facility] but no one is following his pacemaker. She states it is a challenge trying to get him to go to appointments. He will refuse to go or cancel the appointments.

## 2021-03-13 NOTE — Telephone Encounter (Signed)
LVM for patients daughter to call back as patient has been lost to follow up with device since 2018

## 2022-08-29 DEATH — deceased
